# Patient Record
Sex: Male | Born: 1960 | Race: White | Hispanic: No | Marital: Married | Smoking: Current some day smoker
Health system: Southern US, Community
[De-identification: ages and names within clinical notes are randomized; demographics above are authoritative.]

## PROBLEM LIST (undated history)

## (undated) DIAGNOSIS — K5792 Diverticulitis of intestine, part unspecified, without perforation or abscess without bleeding: Secondary | ICD-10-CM

## (undated) DIAGNOSIS — G4730 Sleep apnea, unspecified: Secondary | ICD-10-CM

## (undated) DIAGNOSIS — E119 Type 2 diabetes mellitus without complications: Secondary | ICD-10-CM

## (undated) DIAGNOSIS — G629 Polyneuropathy, unspecified: Secondary | ICD-10-CM

## (undated) DIAGNOSIS — I10 Essential (primary) hypertension: Secondary | ICD-10-CM

## (undated) DIAGNOSIS — G2581 Restless legs syndrome: Secondary | ICD-10-CM

## (undated) DIAGNOSIS — E785 Hyperlipidemia, unspecified: Secondary | ICD-10-CM

## (undated) DIAGNOSIS — C801 Malignant (primary) neoplasm, unspecified: Secondary | ICD-10-CM

## (undated) DIAGNOSIS — M1990 Unspecified osteoarthritis, unspecified site: Secondary | ICD-10-CM

## (undated) HISTORY — DX: Hyperlipidemia, unspecified: E78.5

## (undated) HISTORY — DX: Diverticulitis of intestine, part unspecified, without perforation or abscess without bleeding: K57.92

## (undated) HISTORY — PX: RESECTION OF ABDOMINAL MASS: SHX6450

## (undated) HISTORY — DX: Essential (primary) hypertension: I10

## (undated) HISTORY — DX: Type 2 diabetes mellitus without complications: E11.9

## (undated) HISTORY — DX: Polyneuropathy, unspecified: G62.9

## (undated) HISTORY — PX: COLON SURGERY: SHX602

## (undated) HISTORY — DX: Restless legs syndrome: G25.81

## (undated) HISTORY — DX: Malignant (primary) neoplasm, unspecified: C80.1

---

## 2001-03-09 ENCOUNTER — Ambulatory Visit (HOSPITAL_COMMUNITY): Admission: RE | Admit: 2001-03-09 | Discharge: 2001-03-09 | Payer: Self-pay | Admitting: Internal Medicine

## 2003-07-07 ENCOUNTER — Ambulatory Visit (HOSPITAL_COMMUNITY): Admission: RE | Admit: 2003-07-07 | Discharge: 2003-07-07 | Payer: Self-pay | Admitting: Family Medicine

## 2004-07-29 DIAGNOSIS — K5792 Diverticulitis of intestine, part unspecified, without perforation or abscess without bleeding: Secondary | ICD-10-CM

## 2004-07-29 HISTORY — DX: Diverticulitis of intestine, part unspecified, without perforation or abscess without bleeding: K57.92

## 2004-07-29 HISTORY — PX: COLON SURGERY: SHX602

## 2004-08-31 ENCOUNTER — Inpatient Hospital Stay (HOSPITAL_COMMUNITY): Admission: RE | Admit: 2004-08-31 | Discharge: 2004-09-03 | Payer: Self-pay | Admitting: Family Medicine

## 2004-09-26 ENCOUNTER — Ambulatory Visit: Payer: Self-pay | Admitting: Internal Medicine

## 2004-10-26 ENCOUNTER — Ambulatory Visit (HOSPITAL_COMMUNITY): Admission: RE | Admit: 2004-10-26 | Discharge: 2004-10-26 | Payer: Self-pay | Admitting: Internal Medicine

## 2004-10-26 ENCOUNTER — Ambulatory Visit: Payer: Self-pay | Admitting: Internal Medicine

## 2004-10-26 HISTORY — PX: COLONOSCOPY: SHX174

## 2005-02-25 ENCOUNTER — Ambulatory Visit (HOSPITAL_COMMUNITY): Admission: RE | Admit: 2005-02-25 | Discharge: 2005-02-25 | Payer: Self-pay | Admitting: Family Medicine

## 2005-02-25 ENCOUNTER — Inpatient Hospital Stay (HOSPITAL_COMMUNITY): Admission: AD | Admit: 2005-02-25 | Discharge: 2005-02-27 | Payer: Self-pay | Admitting: Internal Medicine

## 2005-06-11 ENCOUNTER — Encounter (INDEPENDENT_AMBULATORY_CARE_PROVIDER_SITE_OTHER): Payer: Self-pay | Admitting: *Deleted

## 2005-06-11 ENCOUNTER — Inpatient Hospital Stay (HOSPITAL_COMMUNITY): Admission: RE | Admit: 2005-06-11 | Discharge: 2005-06-16 | Payer: Self-pay | Admitting: General Surgery

## 2005-06-21 ENCOUNTER — Emergency Department (HOSPITAL_COMMUNITY): Admission: EM | Admit: 2005-06-21 | Discharge: 2005-06-21 | Payer: Self-pay | Admitting: Emergency Medicine

## 2006-06-18 ENCOUNTER — Emergency Department (HOSPITAL_COMMUNITY): Admission: EM | Admit: 2006-06-18 | Discharge: 2006-06-18 | Payer: Self-pay | Admitting: Emergency Medicine

## 2008-12-29 ENCOUNTER — Emergency Department (HOSPITAL_COMMUNITY): Admission: EM | Admit: 2008-12-29 | Discharge: 2008-12-29 | Payer: Self-pay | Admitting: Emergency Medicine

## 2009-12-08 ENCOUNTER — Ambulatory Visit (HOSPITAL_COMMUNITY): Admission: RE | Admit: 2009-12-08 | Discharge: 2009-12-08 | Payer: Self-pay | Admitting: Family Medicine

## 2010-02-28 DIAGNOSIS — E785 Hyperlipidemia, unspecified: Secondary | ICD-10-CM | POA: Insufficient documentation

## 2010-02-28 DIAGNOSIS — I10 Essential (primary) hypertension: Secondary | ICD-10-CM | POA: Insufficient documentation

## 2010-03-07 ENCOUNTER — Encounter (INDEPENDENT_AMBULATORY_CARE_PROVIDER_SITE_OTHER): Payer: Self-pay

## 2010-03-09 ENCOUNTER — Ambulatory Visit: Payer: Self-pay | Admitting: Internal Medicine

## 2010-03-09 DIAGNOSIS — K921 Melena: Secondary | ICD-10-CM | POA: Insufficient documentation

## 2010-03-09 DIAGNOSIS — Z8719 Personal history of other diseases of the digestive system: Secondary | ICD-10-CM | POA: Insufficient documentation

## 2010-03-13 ENCOUNTER — Encounter: Payer: Self-pay | Admitting: Internal Medicine

## 2010-03-19 ENCOUNTER — Ambulatory Visit: Payer: Self-pay | Admitting: Internal Medicine

## 2010-03-19 ENCOUNTER — Ambulatory Visit (HOSPITAL_COMMUNITY): Admission: RE | Admit: 2010-03-19 | Discharge: 2010-03-19 | Payer: Self-pay | Admitting: Internal Medicine

## 2010-03-19 HISTORY — PX: COLONOSCOPY: SHX174

## 2010-03-25 ENCOUNTER — Encounter: Payer: Self-pay | Admitting: Internal Medicine

## 2010-08-28 NOTE — Miscellaneous (Signed)
Summary: ct- May 2011  Clinical Lists Changes CT Abd/Pelvis W CM - STATUS: Final  IMAGE                                     Perform Date: 13May11 18:09  Ordered By: Phillips Odor MD Arlana Pouch Date: [id number] 15:00  Facility: [facility]                               Department: CT  Service Report Text  [facility] Accession Number: [id number]      Clinical Data: Right lower quadrant pain for 1 week.    CT ABDOMEN AND PELVIS WITH CONTRAST    Technique:  Multidetector CT imaging of the abdomen and pelvis was   performed following the standard protocol during bolus   administration of intravenous contrast.    Contrast:    Comparison: None.    Findings: Lung bases are clear.  No pericardial effusion.    Small 1 cm hypodensity in the right hepatic lobe is unchanged.  The   gallbladder, pancreas, spleen, and left adrenal gland are   unchanged.  The right adrenal gland is surgically absent.  The   kidneys appear normal.    The stomach, small bowel, cecum, appendix are normal.  There are   diverticula of the descending colon and proximal sigmoid colon   without evidence acute inflammation.    Abdominal aorta is normal caliber.  No evidence retroperitoneal   lymphadenopathy.    No free fluid the pelvis.  Bladder prostate are normal.  No   evidence of pelvic lymphadenopathy. Review of  bone windows   demonstrates no aggressive osseous lesions.    IMPRESSION:   1.  No acute abdominal  or pelvic process.   2.  Normal appendix.   3.  Diverticulosis without evidence of diverticulitis.    Read By:  Genevive Bi,  M.D.   Released By:  Genevive Bi,  M.D.  Additional Information  HL7 RESULT STATUS : F  External image : [id number]  External IF Update Timestamp : 2009-12-08:18:28:20.000000

## 2010-08-28 NOTE — Assessment & Plan Note (Signed)
Summary: consult for tcs,rectal bleeding/ss   Primary Care Provider:  [facility]  Chief Complaint:  consult for TCS.  History of Present Illness: 50 year old gentleman with a history of complicated diverticulitis requiring sigmoid colectomy some 5 years ago and reported family history of colon cancer returns now for consideration of screening colonoscopy.  Patient states has some bouts of intermittent bloody diarrhea over the past several months. He may go up to 3 days without a bowel movement and then has a large loose stool. Somewhat constipated at times. Not taking any fiber supplementation.  Diverticulosis on 2006 colonoscopy. 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 He tells me his father may have actually had stomach cancer with metastasis rather he colon cancer but he is not absolutely certain.  since being seen here he has a new diagnosis of sleep apnea treated with nasal CPAP.   Current Medications (verified): 1)  Lotensin 10 Mg Tabs (Benazepril Hcl) .... Two Tablets Daily 2)  Aleve Cold & Sinus 120-220 Mg Xr12h-Tab (Pseudoephedrine-Naproxen Na) .... As Needed 3)  Wellbutrin Sr 150 Mg Xr12h-Tab (Bupropion Hcl) .... Once Daily  Allergies (verified): 1)  ! * Bee Sting  Past History:  Past Medical History: Last updated: 03/07/2010 RIGHT ADRENAL CARCINOMA Diverticulitis Hypertension Hyperlipidemia  Past Surgical History: Last updated: 03/07/2010 RIGHT ADRENALECTOMY ON 02-19-99 Hemicolectomy  Family History: Last updated: 2010-03-31 Father: Deceased age 69's   Colon Cancer Mother: Living age 60   healthy Siblings:one brother living........not healthy  One brother killed in army   Social History: Last updated: 03-31-10 Marital Status: Married Children: 1 Occupation: [employer] doctor  Family History: Father: Deceased age 77's   Colon Cancer Mother: Living age 55   healthy Siblings:one brother living........not healthy  One  brother killed in army   Social History: Marital Status: Married Children: 1 Occupation: [employer] doctor  Vital Signs:  Patient profile:   50 year old male Height:      69 inches Weight:      279 pounds BMI:     41.35 Temp:     98.5 degrees F oral Pulse rate:   88 / minute BP sitting:   110 / 78  (left arm) Cuff size:   large  Vitals Entered By: Cloria Spring LPN (Mar 31, 2010 3:42 PM)  Physical Exam  General:  obese gentleman resting comfortably in no acute distress Lungs:  clear to auscultation Heart:  regular rate rhythm without murmur gallop rub Abdomen:  obese positive bowel sounds soft nontender without appreciable mass or organomegaly Rectal:  deferred until time of colonoscopy  Impression & Recommendations: Impression: A 50 year old gentleman with complicated diverticulitis requiring sigmoid resection previously now has intermittent bloody diarrhea along with  alternating constipation.  It appears he does not have a family history of colon cancer, however.  Recommendations: This gentleman needs to have a diagnostic colonoscopy in the near future. The risks, benefits, limitations, alternatives and imponderables have been reviewed prescription been answered he is agreeable. We'll make further recommendations in the very near future after colonoscopy has been performed.  Appended Document: Orders Update    Clinical Lists Changes  Problems: Added new problem of HEMATOCHEZIA (ICD-578.1) Added new problem of DIVERTICULITIS, HX OF (ICD-V12.79) Orders: Added new Service order of New Patient Level III [phone]) - Signed

## 2010-08-28 NOTE — Letter (Signed)
Summary: Internal Other /TCS order  Internal Other /TCS order   Imported By: Cloria Spring LPN 04/54/0981 19:14:78  _____________________________________________________________________  External Attachment:    Type:   Image     Comment:   External Document

## 2010-08-28 NOTE — Letter (Signed)
Summary: Patient Notice, Colon Biopsy Results  [facility]  [address]   [place], [place] [address]   Phone: [phone]  Fax: [phone]       March 25, 2010   RONY RATZ [address] [place], [place]  [address] [birth year]    Dear Mr. Bremner,  I am pleased to inform you that the biopsies taken during your recent colonoscopy did not show any evidence of cancer upon pathologic examination.  Additional information/recommendations:  You should have a repeat colonoscopy examination  in 7 years.  Please call us if you are having persistent problems or have questions about your condition that have not been fully answered at this time.  Sincerely,    [name] MD, FACP [facility] Ph: [phone]    Fax: [phone]   Appended Document: Patient Notice, Colon Biopsy Results letter mailed to pt  Appended Document: Patient Notice, Colon Biopsy Results reminder in computer

## 2010-08-28 NOTE — Miscellaneous (Signed)
Summary: tcs and surgery note  Clinical Lists Changes  NAME:  Jason French, Jason French               ACCOUNT NO.:  [id number]   MEDICAL RECORD NO.:  [id number]          PATIENT TYPE:  INP   LOCATION:  A306                          FACILITY:  [facility]   PHYSICIAN:  [name], M.D. DATE OF BIRTH:  [birth year]   DATE OF CONSULTATION:  09/26/2004  DATE OF DISCHARGE:  09/03/2004                                   CONSULTATION   CONSULTING PHYSICIAN:  [name], M.D.   CHIEF COMPLAINT:  Diverticulitis, possible colonoscopy.   HISTORY OF PRESENT ILLNESS:  The patient is a 50 year old Caucasian  gentleman with a history of recurrent acute diverticulitis who presents  today for further evaluation and for consideration of colonoscopy.  He  states he has had diverticulitis at least on two occasions each year since  2000.  He recent had a severe attack, required hospitalization.  CT of the  abdomen and pelvis, on August 31, 2004, revealed significant inflammation  surrounding the sigmoid colon, multiple diverticula, and findings felt to be  consistent for diverticulitis.  No evidence of abscess or free  intraperitoneal air.  On the same day, he had a white count of 18,500, sed  rate 27, LFTs were normal.  He was given antibiotics while hospitalized and  a completed the course as an outpatient.  He completed antibiotics last  week.  He states he feels much better.  In between attacks, he denies any  problems with his bowels but proceeding an attack of diverticulitis, he gets  constipated.  He is not on fiber supplementation regularly but does consume  a high fiber cereal on a daily basis.  Currently his bowels are moving  daily.  He occasionally sees blood in his stool.  He is color blind and  unsure of the color of the blood.  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  He  denies any chronic nausea, vomiting.  He  occasionally has acid reflux but is  no longer on PPI therapy.  He tries to watch his diet or takes OTC antacids.  With regards to his recent abdominal pain, he tends to get lower pelvic,  midline pelvic, abdominal pain with the diverticulitis.  This pain is now  resolved, status post treatment.  He tells me this is at least his third  documented case of acute diverticulitis based on CT scans.  His first attack  was in 2000 and this involved the distal descending colon per CT report.  He  also had a surveillance CT regarding his history of adrenal gland carcinoma  and at this time had a mild flare diverticulitis then as well.   CURRENT MEDICATIONS:  1.  Lotensin 10 mg every day.  2.  Tylenol or BC Powder p.r.n.   ALLERGIES:  No known drug allergies.   PAST MEDICAL HISTORY:  1.  Hypertension.  2.  Recurrent acute diverticulitis.  3.  Colonoscopy, in August 2002, by Dr. [name], revealed internal hemorrhoids  and scattered left sided diverticula.  He had a polyp on a stalk at the      hepatic flexure with pathology consistent with inflammatory polyp with      focal hyperplastic architecture.  No adenomatous changes seen.  4.  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  5.  He has had carpal tunnel release.   FAMILY HISTORY:  Mother has a history of colonic polyps.  Father had surgery  in his 41s for colorectal cancer.  He is status post surgery and  chemotherapy and doing well.   SOCIAL HISTORY:  He is married and has a daughter.  He works for __________  .  He smokes a pack of cigarettes daily.  He occasionally consumes alcohol.   REVIEW OF SYSTEMS:  See HPI for GI.  CARDIOPULMONARY:  Denies any chest pain  or shortness of breath.  CONSTITUTIONAL:  Denies any weight loss.   PHYSICAL EXAMINATION:  VITAL SIGNS:  Weight 233, height 5 foot 9 inches,  temp 97.9, blood pressure 136/84, pulse initially 108 on repeat it was 100.  GENERAL:  A  pleasant, well nourished, well developed, Caucasian male in no  acute distress.  SKIN:  Warm and dry.  No jaundice.  HEENT:  Pupils are equal, round and reactive to light.  Conjunctivae are  pink.  Sclerae nonicteric.  Oropharyngeal mucosa moist and pink.  No  lesions, erythema, or exudate.  No lymphadenopathy, thyromegaly.  CHEST:  Lungs are clear to auscultation.  CARDIAC:  Reveals a regular rate and rhythm.  Normal S1 S2.  No murmurs,  rubs or gallops.  ABDOMEN:  Positive bowel sounds.  Soft, nondistended.  He has mild  tenderness to the right mid abdomen which he states is from the chronic pain  from prior surgery.  There is no rebound tenderness or guarding.  No  organomegaly or masses.  No abdominal hernias.  EXTREMITIES:  No edema.   IMPRESSION:  1.  The patient is a 50 year old gentleman with recurrent acute      diverticulitis.  His most recent bout was the first of February, but he      actually just finished antibiotics last week.  He is feeling much      better.  His bowel movements are back to normal.  He denies any      associated abdominal pain.  2.  On examination he has a mild right mid abdominal pain which I feel is      unrelated to his recent diverticulitis.  This is most likely due to his      prior adrenal gland carcinoma surgery.  I have asked the patient to      monitor the pain.  If it seems to worsen or becomes noticeable (as the      patient had not complained of any pain prior to examination), then he      will let us know and may need to consider repeat CT.  Otherwise we will      plan on colonoscopy in the next several weeks.  I discussed with the      patient today that he may ultimately be looking at surgery given      recurrent bouts of diverticulitis.  However, at this time it is      important to proceed with the colonoscopy to ensure there are no other      abnormalities present  in the colon.  In addition, he now has a family      history of  colorectal cancer. 3.  Hematochezia, possibly due to internal hemorrhoids.   PLAN:  1.  Colonoscopy in the next 3-4 weeks.  2.  If he has any worsening abdominal pain or recurrent pain similar to his      diverticulitis, he will let us know.  3.  He should take fiber of choice two tablets daily and continue a high      fiber diet.      LL/MEDQ  D:  09/26/2004  T:  09/26/2004  Job:  [id number]   cc:   Madelin Rear. Sherwood Gambler, MD  [address]  [place]  [place] [address]  Fax: [phone]       NAME:  ZAYDE, STROUPE NO.:  [id number]   MEDICAL RECORD NO.:  [id number]          PATIENT TYPE:  AMB   LOCATION:  DAY                           FACILITY:  [facility]   PHYSICIAN:  [name], M.D. DATE OF BIRTH:  1960/08/14   DATE OF PROCEDURE:  10/26/2004  DATE OF DISCHARGE:                                 OPERATIVE REPORT   PROCEDURE:  Colonoscopy, snare polypectomy, biopsy.   INDICATION FOR PROCEDURE:  The patient is a 50 year old gentleman with a  history of diverticulitis who has had some rectal bleeding and is having  some abdominal pain too.  He was seen in the office on September 26, 2004.  He  said since that time, his abdominal pain has resolved.  Positive family  history for colorectal neoplasia in his father.  Colonoscopy is now being  done.  This approach has been discussed at length, the potential risks,  benefits, and alternatives have been reviewed, questions answered.  He is  agreeable.  Please see documentation in the medical record.   O2 saturation, blood pressure, pulse, and respiration were monitored  throughout the entire procedure.   CONSCIOUS SEDATION:  Versed 5 mg IV, Demerol 125 mg IV in divided doses.   INSTRUMENT USED:  Olympus video chip system.   FINDINGS:  Digital rectal exam revealed no abnormalities.  Endoscopic  findings:  The prep was good.   Rectum:  Examination of the rectal mucosa including retroflexed view of the  anal verge revealed  only internal hemorrhoids.   Colon:  The colonic mucosa was surveyed from the rectosigmoid junction  through the rectosigmoid junction through the left, transverse and right  colon to the area of the appendiceal orifice, ileocecal valve and cecum.  These structures were well-seen and photographed for the record.  From this  level the scope was slowly withdrawn and all previously-mentioned mucosal  surfaces were again seen.  The patient had few scattered pancolonic  diverticula.  There was a 6 mm pedunculated polyp at the splenic flexure,  which was cold snared, and two other diminutive polyps in the mid-descending  colon, which were cold biopsied and removed.  The remainder of the colonic  mucosa appeared normal.  The patient tolerated the procedure well and was  reacted in endoscopy.   IMPRESSION:  1.  Internal hemorrhoids, otherwise normal.  2.  Few scattered pancolonic diverticula, left colon polyps removed as      described above.   RECOMMENDATIONS:  1.  No aspirin or arthritis medications for 10 days.  2.  Hemorrhoid/diverticulosis literature provided to Mr. Regnier.  3.  He should bolster his fiber intake take some Metamucil or Citrucel every      day.  4.  A 10-day course Anusol-HC suppositories one per rectum at bedtime.  5.  Follow up on pathology.  6.  Further recommendations to follow.      RMR/MEDQ  D:  10/26/2004  T:  10/26/2004  Job:  [id number]   cc:   Madelin Rear. Sherwood Gambler, MD  [address]  [place]  [place] [address]  Fax: [phone]    NAME:  ALEXYS, GASSETT NO.:  [id number]   MEDICAL RECORD NO.:  [id number]          PATIENT TYPE:  INP   LOCATION:  0008                         FACILITY:  [facility]   PHYSICIAN:  Angelia Mould. Derrell Lolling, M.D.DATE OF BIRTH:  Mar 26, 1961   DATE OF PROCEDURE:  06/11/2005  DATE OF DISCHARGE:                                 OPERATIVE REPORT   PREOPERATIVE DIAGNOSES:  Sigmoid diverticulitis.   POSTOPERATIVE DIAGNOSES:  Sigmoid  diverticulitis.   OPERATION:  Sigmoid colectomy.   SURGEON:  Angelia Mould. Derrell Lolling, M.D.   FIRST ASSISTANT:  Gita Kudo, M.D.   INDICATIONS FOR PROCEDURE:  This is a 50 year old white man who has been  hospitalized several times for diverticulitis. 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 He has been hospitalized in February of  this year and again in July of this and on both occasions clinically had  diverticulitis and by CT scan had a focal area of inflammatory change in the  sigmoid colon. The last time he was hospitalized and a CT scan suggested a  small abscess. He has had a colonoscopy this year which shows diverticula  but no tumor. He was counseled as an outpatient regarding elective surgery  and was advised that was a good option given his young age and recurrent  disease. He is brought to the operating room electively having undergone a 2  day bowel prep at home.   FINDINGS:  There was a focal area of chronic inflammation in the mid sigmoid  colon. This area extended over a distance of about 6 cm and the proximal  rectum distally and the proximal sigmoid and distal descending colon looked  perfectly normal. Really no significant diverticula were seen. There were  just a few adhesions in the mid abdomen. There was no fluid. There was no  sign of any recurrent cancer. The small bowel felt normal.   TECHNIQUE:  Following the induction of general endotracheal anesthesia, the  patient was identified, intravenous antibiotics were given, a Foley catheter  was inserted, the nasogastric tube was inserted and the abdomen was prepped  and draped in a sterile  fashion. A lower midline incision was made. The  fascia was incised in the midline. The abdominal cavity was entered. Self  retaining retractors were  placed. The abdomen was explored with findings as  described above. The small bowel was packed away with moist laparotomy pads.  I mobilized the descending colon by dividing the lateral peritoneal  attachments and mobilized it toward the midline. I did not have to take down  the splenic flexure. I mobilized the sigmoid colon by dividing its lateral  peritoneal attachments and mobilizing it medially. I also incised the  peritoneum down into the pelvis on each side of the proximal rectum. I  identified the area of disease. I selected an area proximal and distal to  this at least 8 cm proximal and distal to the focal disease and the colon  was transected between Allen clamps. Mesenteric vessels were isolated,  clamped, divided and ligated with 2-0 silk ties and 2-0 silk suture  ligatures. The mesenteric dissection was kept close to the colon to avoid  injury to any retroperitoneal structure. An anastomosis was created between  the proximal and distal ends of the colon in an end to end fashion with  interrupted sutures of 2-0 silk. Corner stitches were placed to set up the  anastomosis. A single suture was placed in the midpoint of the back wall of  the anastomosis and tied. The back wall of the anastomosis was then  completed with interrupted simple sutures of 2-0 silk. At the corners, we  placed several interrupted inverting sutures of 2-0 silk and after this  turned the corners in nicely, we completed the anastomosis anteriorly with  simple sutures of 2-0 silk which were placed in inverting fashion. After all  of this was done, the anastomosis was inspected. It looked very good. There  was no tension. The vascularity of the colon was excellent. At this point,  we changed our instruments and gloves and suction devices. We irrigated out  the lower abdomen and pelvis with 2000 mL of saline. There was no bleeding.  Fluid was completely clear. We closed the mesentery with some interrupted   figure-of-eight sutures of 2-0 silk. We placed Tisseel around the  anastomosis and let it harden and then we returned the omentum and small  bowel to their anatomic positions. The midline fascia was closed with a  running suture of #1 PDS. After irrigating the skin, we closed the skin with  skin staples. Clean bandages were placed and the patient taken to the  recovery room in stable condition. Estimated blood loss was about 75 to 100  mL. Complications none. Sponge and instrument counts were correct.      Angelia Mould. Derrell Lolling, M.D.  Electronically Signed     HMI/MEDQ  D:  06/11/2005  T:  06/11/2005  Job:  [id number]   cc:   Patrica Duel, M.D.  Fax: [phone]   [name], M.D.  [address]  [place]  [place] [address]    [facility] Surgical Pathology - STATUS: Final             By: Osie Bond  ,      Perform Date: [phone] 00:01  Ordered By: Lauretta Chester,        Ordered Date: [phone] 11:24  Facility: [facility]                              Department:  CPATH  Service Report Text  [facility]   [address] [place], [place] [address]   [phone]    REPORT OF SURGICAL PATHOLOGY    Case #: [id number]   Patient Name: NICHOLI, GHUMAN   PID: [id number]   Pathologist: Renato Battles, M.D.   DOB[birth year]Age 50[birth year] (Age: 15) Gender: M   Date Taken: 06/11/2005   Date Received: 06/11/2005    FINAL DIAGNOSIS    ***MICROSCOPIC EXAMINATION AND DIAGNOSIS***    SIGMOID COLON, PARTIAL RESECTION: DIVERTICULOSIS AND   DIVERTICULITIS. TWO REACTIVE LYMPH NODES.    cf   Date Reported: 06/12/2005 Renato Battles, M.D.   *** Electronically Signed Out By MS ***    Clinical information   Diverticulitis. (hd)    specimen(s) obtained   Colon, segmental resection, sigmoid    Gross Description   Specimen: Sigmoid   Length: 10 cm   Serosa: Pink to hyperemic, focally granular, with a small   amount of attached soft to rubbery mesentery   Contents: A small  amount of soft green fecal material   Mucosa/Wall: Tan to hyperemic, smooth with normal intestinal   folds. There are several grossly un-perforated diverticula./ up   to 1 cm thick   Lymph nodes: Found within the fat are two possible lymph nodes,   0.3 and 0.4 cm in greatest dimension   Block Summary:   A, B- Margins of resection   C, D- Diverticula   E- Two possible nodes, whole (5 blocks)   SW:mw 06/11/05    mw/

## 2010-09-07 ENCOUNTER — Encounter: Payer: Self-pay | Admitting: Internal Medicine

## 2010-09-13 NOTE — Letter (Signed)
Summary: [facility]  [facility]   Imported By: Rexene Alberts 09/07/2010 09:29:24  _____________________________________________________________________  External Attachment:    Type:   Image     Comment:   External Document

## 2010-12-14 NOTE — Consult Note (Signed)
Jason French, Jason French               ACCOUNT NO.:  [id number]   MEDICAL RECORD NO.:  [id number]          PATIENT TYPE:  INP   LOCATION:  A306                          FACILITY:  [facility]   PHYSICIAN:  [name], M.D. DATE OF BIRTH:  [birth year]   DATE OF CONSULTATION:  09/26/2004  DATE OF DISCHARGE:  09/03/2004                                   CONSULTATION   CONSULTING PHYSICIAN:  [name], M.D.   CHIEF COMPLAINT:  Diverticulitis, possible colonoscopy.   HISTORY OF PRESENT ILLNESS:  The patient is a 50 year old Caucasian  gentleman with a history of recurrent acute diverticulitis who presents  today for further evaluation and for consideration of colonoscopy.  He  states he has had diverticulitis at least on two occasions each year since  2000.  He recent had a severe attack, required hospitalization.  CT of the  abdomen and pelvis, on August 31, 2004, revealed significant inflammation  surrounding the sigmoid colon, multiple diverticula, and findings felt to be  consistent for diverticulitis.  No evidence of abscess or free  intraperitoneal air.  On the same day, he had a white count of 18,500, sed  rate 27, LFTs were normal.  He was given antibiotics while hospitalized and  a completed the course as an outpatient.  He completed antibiotics last  week.  He states he feels much better.  In between attacks, he denies any  problems with his bowels but proceeding an attack of diverticulitis, he gets  constipated.  He is not on fiber supplementation regularly but does consume  a high fiber cereal on a daily basis.  Currently his bowels are moving  daily.  He occasionally sees blood in his stool.  He is color blind and  unsure of the color of the blood.  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  He  denies any chronic nausea, vomiting.  He occasionally has acid reflux but is  no longer on PPI therapy.   He tries to watch his diet or takes OTC antacids.  With regards to his recent abdominal pain, he tends to get lower pelvic,  midline pelvic, abdominal pain with the diverticulitis.  This pain is now  resolved, status post treatment.  He tells me this is at least his third  documented case of acute diverticulitis based on CT scans.  His first attack  was in 2000 and this involved the distal descending colon per CT report.  He  also had a surveillance CT regarding his history of adrenal gland carcinoma  and at this time had a mild flare diverticulitis then as well.   CURRENT MEDICATIONS:  1.  Lotensin 10 mg every day.  2.  Tylenol or BC Powder p.r.n.   ALLERGIES:  No known drug allergies.   PAST MEDICAL HISTORY:  1.  Hypertension.  2.  Recurrent acute diverticulitis.  3.  Colonoscopy, in August 2002, by Dr. [name], revealed internal hemorrhoids      and scattered left sided diverticula.  He had a polyp  on a stalk at the      hepatic flexure with pathology consistent with inflammatory polyp with      focal hyperplastic architecture.  No adenomatous changes seen.  4.  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  5.  He has had carpal tunnel release.   FAMILY HISTORY:  Mother has a history of colonic polyps.  Father had surgery  in his 92s for colorectal cancer.  He is status post surgery and  chemotherapy and doing well.   SOCIAL HISTORY:  He is married and has a daughter.  He works for __________  .  He smokes a pack of cigarettes daily.  He occasionally consumes alcohol.   REVIEW OF SYSTEMS:  See HPI for GI.  CARDIOPULMONARY:  Denies any chest pain  or shortness of breath.  CONSTITUTIONAL:  Denies any weight loss.   PHYSICAL EXAMINATION:  VITAL SIGNS:  Weight 233, height 5 foot 9 inches,  temp 97.9, blood pressure 136/84, pulse initially 108 on repeat it was 100.  GENERAL:  A pleasant, well nourished, well developed, Caucasian male in no   acute distress.  SKIN:  Warm and dry.  No jaundice.  HEENT:  Pupils are equal, round and reactive to light.  Conjunctivae are  pink.  Sclerae nonicteric.  Oropharyngeal mucosa moist and pink.  No  lesions, erythema, or exudate.  No lymphadenopathy, thyromegaly.  CHEST:  Lungs are clear to auscultation.  CARDIAC:  Reveals a regular rate and rhythm.  Normal S1 S2.  No murmurs,  rubs or gallops.  ABDOMEN:  Positive bowel sounds.  Soft, nondistended.  He has mild  tenderness to the right mid abdomen which he states is from the chronic pain  from prior surgery.  There is no rebound tenderness or guarding.  No  organomegaly or masses.  No abdominal hernias.  EXTREMITIES:  No edema.   IMPRESSION:  1.  The patient is a 50 year old gentleman with recurrent acute      diverticulitis.  His most recent bout was the first of February, but he      actually just finished antibiotics last week.  He is feeling much      better.  His bowel movements are back to normal.  He denies any      associated abdominal pain.  2.  On examination he has a mild right mid abdominal pain which I feel is      unrelated to his recent diverticulitis.  This is most likely due to his      prior adrenal gland carcinoma surgery.  I have asked the patient to      monitor the pain.  If it seems to worsen or becomes noticeable (as the      patient had not complained of any pain prior to examination), then he      will let us know and may need to consider repeat CT.  Otherwise we will      plan on colonoscopy in the next several weeks.  I discussed with the      patient today that he may ultimately be looking at surgery given      recurrent bouts of diverticulitis.  However, at this time it is      important to proceed with the colonoscopy to ensure there are no other      abnormalities present in the colon.  In addition, he now has a  family      history of colorectal cancer. 3.  Hematochezia, possibly due to internal  hemorrhoids.   PLAN:  1.  Colonoscopy in the next 3-4 weeks.  2.  If he has any worsening abdominal pain or recurrent pain similar to his      diverticulitis, he will let us know.  3.  He should take fiber of choice two tablets daily and continue a high      fiber diet.      LL/MEDQ  D:  09/26/2004  T:  09/26/2004  Job:  [id number]   cc:   Madelin Rear. Sherwood Gambler, MD  [address]  [place]  [place] [address]  Fax: [phone]

## 2010-12-14 NOTE — Op Note (Signed)
NAME:  Jason French, Jason French               ACCOUNT NO.:  [id number]   MEDICAL RECORD NO.:  [id number]          PATIENT TYPE:  AMB   LOCATION:  DAY                           FACILITY:  [facility]   PHYSICIAN:  [name], M.D. DATE OF BIRTH:  02-04-1961   DATE OF PROCEDURE:  10/26/2004  DATE OF DISCHARGE:                                 OPERATIVE REPORT   PROCEDURE:  Colonoscopy, snare polypectomy, biopsy.   INDICATION FOR PROCEDURE:  The patient is a 50 year old gentleman with a  history of diverticulitis who has had some rectal bleeding and is having  some abdominal pain too.  He was seen in the office on September 26, 2004.  He  said since that time, his abdominal pain has resolved.  Positive family  history for colorectal neoplasia in his father.  Colonoscopy is now being  done.  This approach has been discussed at length, the potential risks,  benefits, and alternatives have been reviewed, questions answered.  He is  agreeable.  Please see documentation in the medical record.   O2 saturation, blood pressure, pulse, and respiration were monitored  throughout the entire procedure.   CONSCIOUS SEDATION:  Versed 5 mg IV, Demerol 125 mg IV in divided doses.   INSTRUMENT USED:  Olympus video chip system.   FINDINGS:  Digital rectal exam revealed no abnormalities.  Endoscopic  findings:  The prep was good.   Rectum:  Examination of the rectal mucosa including retroflexed view of the  anal verge revealed only internal hemorrhoids.   Colon:  The colonic mucosa was surveyed from the rectosigmoid junction  through the rectosigmoid junction through the left, transverse and right  colon to the area of the appendiceal orifice, ileocecal valve and cecum.  These structures were well-seen and photographed for the record.  From this  level the scope was slowly withdrawn and all previously-mentioned mucosal  surfaces were again seen.  The patient had few scattered pancolonic  diverticula.  There was a 6  mm pedunculated polyp at the splenic flexure,  which was cold snared, and two other diminutive polyps in the mid-descending  colon, which were cold biopsied and removed.  The remainder of the colonic  mucosa appeared normal.  The patient tolerated the procedure well and was  reacted in endoscopy.   IMPRESSION:  1.  Internal hemorrhoids, otherwise normal.  2.  Few scattered pancolonic diverticula, left colon polyps removed as      described above.   RECOMMENDATIONS:  1.  No aspirin or arthritis medications for 10 days.  2.  Hemorrhoid/diverticulosis literature provided to [name].  3.  He should bolster his fiber intake take some Metamucil or Citrucel every      day.  4.  A 10-day course Anusol-HC suppositories one per rectum at bedtime.  5.  Follow up on pathology.  6.  Further recommendations to follow.      RMR/MEDQ  D:  10/26/2004  T:  10/26/2004  Job:  [id number]   cc:   Madelin Rear. Sherwood Gambler, MD  [address]  [place]  [place] [address]  Fax: [phone]

## 2010-12-14 NOTE — H&P (Signed)
Jason French, Jason French               ACCOUNT NO.:  [id number]   MEDICAL RECORD NO.:  [id number]          PATIENT TYPE:  INP   LOCATION:  A306                          FACILITY:  [facility]   PHYSICIAN:  Patrica Duel, M.D.    DATE OF BIRTH:  [birth year]   DATE OF ADMISSION:  08/31/2004  DATE OF DISCHARGE:  LH                                HISTORY & PHYSICAL   CHIEF COMPLAINT:  Abdominal pain.   HISTORY OF PRESENT ILLNESS:  This is a 50 year old male with a history of  adrenal tumor status post complete resection.  He also has a history of  diverticulitis, has been seen by Dr. Kendell Bane in the past.   The patient presented to the office with a 48-hour history of increasingly  severe lower abdominal pain and cramping.  Clinically, he appeared quite ill  with a very tender abdomen, particularly in the left lower quadrant.  He was  immediately sent for CT scanning.   CT scan confirmed the presence of relatively extensive diverticulitis of the  sigmoid colon without perforation or abscess.  The patient is admitted with  acute diverticulitis.   There is no history of nausea, vomiting, chest pain, shortness of breath,  headache, neurologic deficits or genitourinary symptoms.   CURRENT MEDICATIONS:  None.   ALLERGIES:  PENICILLIN, SULFA.   PAST MEDICAL HISTORY:  As noted.   SOCIAL HISTORY:  Nonsmoker.  Nondrinker.  He is a Naval architect.   FAMILY HISTORY:  Noncontributory.   REVIEW OF SYSTEMS:  Negative except as mentioned.   PHYSICAL EXAMINATION:  Very pleasant male who is alert and oriented, no  acute distress.   VITAL SIGNS:  Temperature 100.9, pulse 120, blood pressure 140/99.  HEENT:  Normocephalic, atraumatic.  Pupils are equal.  Ears, nose, throat  benign.  There is no scleral icterus.  NECK:  Supple, no bruits or masses.  LUNGS:  Clear.  HEART:  Heart sounds normal.  ABDOMEN:  Mildly protuberant.  There is marked tenderness and guarding in  the left lower quadrant.  Bowel sounds  are intact.  EXTREMITIES:  No clubbing, cyanosis or edema.   LABORATORY DATA:  Currently pending.   ASSESSMENT:  Acute diverticulitis in a 51 year old male with a history of  the same.   PLAN:  IV Cipro, Flagyl, pain control, possible GI consult.  Surgery is not  indicated at this time.  Will follow.      MC/MEDQ  D:  09/01/2004  T:  09/01/2004  Job:  [id number]

## 2010-12-14 NOTE — Discharge Summary (Signed)
NAMEABDELAZIZ, WESTENBERGER               ACCOUNT NO.:  [id number]   MEDICAL RECORD NO.:  [id number]          PATIENT TYPE:  INP   LOCATION:  A306                          FACILITY:  [facility]   PHYSICIAN:  Patrica Duel, M.D.    DATE OF BIRTH:  [birth year]   DATE OF ADMISSION:  08/31/2004  DATE OF DISCHARGE:  02/06/2006LH                                 DISCHARGE SUMMARY   DISCHARGE DIAGNOSES:  1.  Acute diverticulitis, CT documented, excellent response to therapy.  2.  History of adrenal tumor, status post resection.   __________  Please refer to the admitting note.  Briefly, this 50 year old male, with a  history as noted, presented to the office with a 24-48-hour history of  increasingly severe lower abdominal pain.  Clinically, he appeared quite ill  with a very tender abdomen, particularly in the left lower quadrant.  He was  sent immediately for CT scanning which revealed the presence of extensive  diverticulitis of the sigmoid colon without perforation or abscess.  He was  admitted with acute diverticulitis.   HOSPITAL COURSE:  The patient did well with Cipro and Flagyl Rx.  His pain  has been reduced dramatically.  His appetite is good.  He has had no fever  or chills.  Laboratory has remained stable (potassium 3.2, now normal status  post supplementation) and white count 12,000.   Clinically, the patient is doing very well, tolerating a diet, and is stable  for discharge.  He understands should he have more pain or recurrent fever  or other problems, he is to call us immediately.   DISPOSITION:  Cipro 500 mg b.i.d. and Flagyl 500 mg t.i.d. x 14 days total.  He will be followed and treated expectantly as an outpatient.  We will  consider colonoscopy, though he has had one in the past 2-3 years which was  essentially normal.  We will make further decisions as an outpatient.      MC/MEDQ  D:  09/03/2004  T:  09/03/2004  Job:  [id number]

## 2010-12-14 NOTE — Consult Note (Signed)
Jason French, Jason French               ACCOUNT NO.:  [id number]   MEDICAL RECORD NO.:  [id number]          PATIENT TYPE:  EMS   LOCATION:  ED                           FACILITY:  [facility]   PHYSICIAN:  Angelia Mould. Derrell Lolling, M.D.DATE OF BIRTH:  12/16/1960   DATE OF CONSULTATION:  06/21/2005  DATE OF DISCHARGE:                                   CONSULTATION   CHIEF COMPLAINT:  Painful urination, chills,  and abdominal wound drainage.   HISTORY OF PRESENT ILLNESS:  This is a 50 year old white man well-known to  me.  He underwent an elective sigmoid colon resection on November14,2006 for  diverticulitis.  He did well following that surgery and went home.  He  called me today stating that he had drainage from his wound, was having some  painful urination, and had chills last night. He denied any fever.  His  appetite has been excellent.  He is been having two or three soft bowel  movements/day. He denies any nausea or vomiting.  He and his wife are  concerned about the wound drainage and the dysuria.   Upon further questioning he states that one of my partners took out a couple  of staples from his wound the day he was discharged but did not find any  infection.  He also states that he has normal-appearing urine and he has  copious urine output with a good stream. He just has pain in his penis and a  little bit in the suprapubic area during the active urination and it hurts  right at the end of urination as well.  Again he states that the urine looks  normal.  He states that he is having to wear a bandage on the lower part of  this wound because it drains a little bit.  He has been taking stool  softeners.   EXAMINATION:  GENERAL:  A pleasant middle-aged man who does not appear  acutely ill.  Strong odor of tobacco.  VITAL SIGNS:  Temperature 98.5. Heart rate 121.  Repeat examine it was 85.  Respirations 16, blood pressure 147/102.  ABDOMEN:  Slightly obese.  Soft.  Active bowel sounds.  Lower  midline scar  with staples in place.  There is a very faint amount of erythema right at  the lower end of the midline incision and an open area, where three or four  staples have been removed but there is no purulence or foul odor.  There is  no significant cellulitis.  I spread this area with some Q-tips down about 2  cm in depth but did not get any purulence out.  He does not have any  induration in the area.  The inguinal areas feel normal and are nontender.  GENITALIA:  The penis looks normal.  LUNGS:  Were clear to auscultation.   LABORATORY DATA:  Urinalysis is clear.  Leukocyte esterase is negative.  Nitrite is negative, specific gravity 1.823, pH of 0.5.  Blood count reveals  a hemoglobin of 16.6 and a white blood cell count of 13.1.  Electrolytes  show a BUN of 10,  creatinine of 1.0, sodium 136, potassium 446.   ASSESSMENT:  1.  Dysuria.  It is not clear whether this is simply urethral irritation      from the Foley catheter he had in the hospital, occult urinary tract      infection, or pelvic problem.  2.  Wound drainage.  No evidence of obvious wound infection.  3.  Status post sigmoid colectomy.  He does not appear to have any      significant intra-abdominal complication that I can detect at this time.   PLAN:  1.  Augmentin 875 mg p.o. q.12h. x5 days.  2.  Pyridium 100 mg p.o. t.i.d. x2 days.  3.  He will return see me in the office on Tuesday, November28,2006.  4.  If he becomes more ill or develops more significant symptoms, we will do      a CT scan of the abdomen and pelvis.  That does not seem necessary at      this time.      Angelia Mould. Derrell Lolling, M.D.  Electronically Signed     HMI/MEDQ  D:  06/21/2005  T:  06/21/2005  Job:  [id number]

## 2010-12-14 NOTE — Discharge Summary (Signed)
Jason French, Jason French               ACCOUNT NO.:  [id number]   MEDICAL RECORD NO.:  [id number]          PATIENT TYPE:  INP   LOCATION:  1418                         FACILITY:  [facility]   PHYSICIAN:  Angelia Mould. Derrell Lolling, M.D.DATE OF BIRTH:  07-03-1961   DATE OF ADMISSION:  06/11/2005  DATE OF DISCHARGE:  06/16/2005                                 DISCHARGE SUMMARY   FINAL DIAGNOSES:  1.  Chronic diverticulitis, sigmoid colon.  2.  Hypertension.  3.  Status post right adrenalectomy for adrenocortical carcinoma, no known      recurrence to date.  4.  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   OPERATIONS PERFORMED:  Sigmoid colectomy.  Date June 11, 2005.   HISTORY:  This is a 50 year old white man who has a history of  diverticulitis going back 6 years, at which time he was hospitalized for  diverticulitis.  He was found to have a right-sided adrenal cortical  carcinoma and ultimately had a right adrenalectomy through a right  thoracoabdominal incision by Dr. Hillary Bow at [facility].  He has no  known recurrence to date and has been followed by oncology at [facility].   He was once again hospitalized in February 2006 in [place] and then again  hospitalized in July of this year, both for left lower quadrant pain and CT  findings of inflammatory process of the  sigmoid colon.  He had a  colonoscopy in March of this year showing pancolonic diverticula but no  tumor..  His disease process radiographically has always been localized to  the sigmoid colon.  I was asked to see him as an outpatient.  He was offered  elective sigmoid colectomy which he decided to do and brought to the  hospital electively following bowel prep.   PHYSICAL EXAMINATION:  GENERAL:  Pleasant young man, somewhat overweight, in  no distress.  VITAL SIGNS:  Height 5 feet 9 inches, weight 238.  NECK:  No mass, no adenopathy, no jugular venous distension.  LUNGS:  Clear to  auscultation.  HEART:  Regular rate and rhythm, no murmur.  ABDOMEN:  Somewhat obese, soft, slight suprapubic and left lower quadrant  tenderness but no mass, no distension.  Well-healed right thoracoabdominal  incision.   HOSPITAL COURSE:  On the day of admission, the patient was taken to the  operating room and underwent a sigmoid colectomy.  He seemed to have fairly  focal disease, although there were a few diverticula elsewhere in the colon.  Primary hand-sewn anastomosis was performed.   Postoperatively, the patient did well.  Foley catheter was removed on postop  day #2.  He was passing gas and tolerating a liquid diet by postop day #3.  He was ready for discharge by postop day #5 on June 16, 2005.  At  that time, he had had some bowel movements, was passing flatus, feeling  well, tolerating a regular diet.  His abdominal exam was unremarkable.  His  wounds looked good.  He was given a prescription for pain medication and  asked to follow up with me in  the office in 1-2 weeks.      Angelia Mould. Derrell Lolling, M.D.  Electronically Signed     HMI/MEDQ  D:  07/15/2005  T:  07/16/2005  Job:  [id number]   cc:   Patrica Duel, M.D.  Fax: [phone]   [name], M.D.  [address]  [place]  [place] [address]

## 2010-12-14 NOTE — Op Note (Signed)
NAMEJORDANI, NUNN               ACCOUNT NO.:  [id number]   MEDICAL RECORD NO.:  [id number]          PATIENT TYPE:  INP   LOCATION:  0008                         FACILITY:  [facility]   PHYSICIAN:  Angelia Mould. Derrell Lolling, M.D.DATE OF BIRTH:  10/06/1960   DATE OF PROCEDURE:  06/11/2005  DATE OF DISCHARGE:                                 OPERATIVE REPORT   PREOPERATIVE DIAGNOSES:  Sigmoid diverticulitis.   POSTOPERATIVE DIAGNOSES:  Sigmoid diverticulitis.   OPERATION:  Sigmoid colectomy.   SURGEON:  Angelia Mould. Derrell Lolling, M.D.   FIRST ASSISTANT:  Gita Kudo, M.D.   INDICATIONS FOR PROCEDURE:  This is a 50 year old white man who has been  hospitalized several times for diverticulitis. 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 He has been hospitalized in February of  this year and again in July of this and on both occasions clinically had  diverticulitis and by CT scan had a focal area of inflammatory change in the  sigmoid colon. The last time he was hospitalized and a CT scan suggested a  small abscess. He has had a colonoscopy this year which shows diverticula  but no tumor. He was counseled as an outpatient regarding elective surgery  and was advised that was a good option given his young age and recurrent  disease. He is brought to the operating room electively having undergone a 2  day bowel prep at home.   FINDINGS:  There was a focal area of chronic inflammation in the mid sigmoid  colon. This area extended over a distance of about 6 cm and the proximal  rectum distally and the proximal sigmoid and distal descending colon looked  perfectly normal. Really no significant diverticula were seen. There were  just a few adhesions in the mid abdomen. There was no fluid. There was no  sign of any recurrent cancer.  The small bowel felt normal.   TECHNIQUE:  Following the induction of general endotracheal anesthesia, the  patient was identified, intravenous antibiotics were given, a Foley catheter  was inserted, the nasogastric tube was inserted and the abdomen was prepped  and draped in a sterile fashion. A lower midline incision was made. The  fascia was incised in the midline. The abdominal cavity was entered. Self  retaining retractors were placed. The abdomen was explored with findings as  described above. The small bowel was packed away with moist laparotomy pads.  I mobilized the descending colon by dividing the lateral peritoneal  attachments and mobilized it toward the midline. I did not have to take down  the splenic flexure. I mobilized the sigmoid colon by dividing its lateral  peritoneal attachments and mobilizing it medially. I also incised the  peritoneum down into the pelvis on each side of the proximal rectum. I  identified the area of disease. I selected an area proximal and distal to  this at least 8 cm proximal and distal to the focal disease and  the colon  was transected between [facility] clamps. Mesenteric vessels were isolated,  clamped, divided and ligated with 2-0 silk ties and 2-0 silk suture  ligatures. The mesenteric dissection was kept close to the colon to avoid  injury to any retroperitoneal structure. An anastomosis was created between  the proximal and distal ends of the colon in an end to end fashion with  interrupted sutures of 2-0 silk. Corner stitches were placed to set up the  anastomosis. A single suture was placed in the midpoint of the back wall of  the anastomosis and tied. The back wall of the anastomosis was then  completed with interrupted simple sutures of 2-0 silk. At the corners, we  placed several interrupted inverting sutures of 2-0 silk and after this  turned the corners in nicely, we completed the anastomosis anteriorly with  simple sutures of 2-0 silk  which were placed in inverting fashion. After all  of this was done, the anastomosis was inspected. It looked very good. There  was no tension. The vascularity of the colon was excellent. At this point,  we changed our instruments and gloves and suction devices. We irrigated out  the lower abdomen and pelvis with 2000 mL of saline. There was no bleeding.  Fluid was completely clear. We closed the mesentery with some interrupted  figure-of-eight sutures of 2-0 silk. We placed Tisseel around the  anastomosis and let it harden and then we returned the omentum and small  bowel to their anatomic positions. The midline fascia was closed with a  running suture of #1 PDS. After irrigating the skin, we closed the skin with  skin staples. Clean bandages were placed and the patient taken to the  recovery room in stable condition. Estimated blood loss was about 75 to 100  mL. Complications none. Sponge and instrument counts were correct.      Angelia Mould. Derrell Lolling, M.D.  Electronically Signed     HMI/MEDQ  D:  06/11/2005  T:  06/11/2005  Job:  [id number]   cc:   Patrica Duel, M.D.  Fax: [phone]   [name], M.D.  [address]  [place]  [place] [address]

## 2010-12-14 NOTE — H&P (Signed)
Jason French, Jason French               ACCOUNT NO.:  [id number]   MEDICAL RECORD NO.:  [id number]          PATIENT TYPE:  INP   LOCATION:  A304                          FACILITY:  [facility]   PHYSICIAN:  Madelin Rear. Sherwood Gambler, MD  DATE OF BIRTH:  1961-07-29   DATE OF ADMISSION:  02/25/2005  DATE OF DISCHARGE:  LH                                HISTORY & PHYSICAL   CHIEF COMPLAINT:  Left lower quadrant abdominal pain for several days  associated with constipation for two to three weeks.  In the past 24 hours,  he has had nausea and vomiting and did take a laxative for the constipation  with fair results this morning.   PAST MEDICAL HISTORY:  1.  Jason French is a 50 year old white male who has had several bouts of      diverticulitis with hospitalization in February of this year.  He had      diverticulitis last in 2000.  During this last hospitalization in      February 2006, he was evaluated by Dr. [name] and subsequently had a      colonoscopy and polypectomy post hospitalization.  The colonoscopy was      done October 26, 2004, and the polypectomy diagnosis was hyperplastic      polyp.  2.  Hypertension.  3.  Right adrenal gland carcinoma five years ago.  4.  Carpal tunnel syndrome.   REVIEW OF SYSTEMS:  He denies any headache, dizziness.  No blurred vision,  no sore throat or earache, no cough, chest pain, palpitations, or shortness  of breath, abdominal pain, constipation. Nausea and vomiting as noted above.  He denies any urinary tract symptoms.  In general, he is fatigued and has  anorexia and just does not feel well.   FAMILY HISTORY:  His mother has a history of colon polyps.  His father had  surgery in his 27s for colorectal cancer.  Apparently the father is post  surgery and chemotherapy and is doing well.   SOCIAL HISTORY:  Jason French is married and has a daughter.  He smokes a pack  of cigarettes daily.  He is a [employer] and occasionally consumes  alcohol.   CURRENT  MEDICATIONS:  Lotensin 10 mg p.o. daily.   ALLERGIES:  He reports sensitivity to TRAMADOL which causes upset stomach  and chest pain but no true medication allergies.   PHYSICAL EXAMINATION:  GENERAL:  Alert and oriented, in no acute distress,  but does state he feels bad.  VITAL SIGNS: Temperature 98, blood pressure 110/80.  Weight 230.  HEENT:  Unremarkable.  NECK:  Supple without adenopathy.  HEART:  Regular rate and rhythm without murmur.  LUNGS:  Clear to auscultation.  ABDOMEN: Bowel sounds are positive in all four quadrants, soft, tender,  especially in the left lower quadrant, with some guarding present.  EXTREMITIES:  No edema, cyanosis, or clubbing.  BACK:  Negative for CVA or bony tenderness.  NEUROLOGIC:  Cranial nerves II-XII grossly intact.   LABORATORY DATA:  CBC with differential drawn in our office: White count  20.3, hemoglobin 16.2,  hematocrit 46.5, platelet count 385.  CMET normal  except for sodium 134, amylase 49, lipase 19.   Plain films of abdomen flat and upright show an abnormal gas pattern.   CT scan was done of the abdomen and pelvis.  I do not have that report in  front of me, but called report did reveal diverticulitis and abscess  present.   ASSESSMENT:  1.  Diverticulitis with abscess formation.  2.  History of hypertension.  3.  History of adrenal carcinoma on the right.  4.  History of peptic ulcer disease, currently on no therapy.   PLAN:  The patient is admitted to Dr. Sherwood Gambler for IV fluids, IV Cipro, and IV  Flagyl, and pain management.  He will be given antiemetics as needed and  will continue all current home medications.       TH/MEDQ  D:  02/25/2005  T:  02/25/2005  Job:  [id number]

## 2011-11-14 ENCOUNTER — Telehealth (HOSPITAL_COMMUNITY): Payer: Self-pay | Admitting: Dietician

## 2011-11-14 NOTE — Telephone Encounter (Signed)
Received fax from RayAnn Edmonds of [facility] inquiring if pt had been seen. There is no record of appointment or visit for [facility] diabetes class or one-on-one counseling with [facility] RD from January 4004 to present. This information was faxed back to RayAnn for [facility]'s records.  

## 2013-05-11 ENCOUNTER — Telehealth (HOSPITAL_COMMUNITY): Payer: Self-pay | Admitting: Dietician

## 2013-05-11 NOTE — Telephone Encounter (Signed)
Returned call and left message on voicemail at [phone].

## 2013-05-11 NOTE — Telephone Encounter (Signed)
Received message from pt left on 05/10/13 at 1704.

## 2013-05-12 NOTE — Telephone Encounter (Signed)
No further response from pt. Will close out.  

## 2014-09-22 ENCOUNTER — Ambulatory Visit (HOSPITAL_COMMUNITY)
Admission: RE | Admit: 2014-09-22 | Discharge: 2014-09-22 | Disposition: A | Discharge status: Home / Self Care | Payer: Commercial Managed Care - PPO | Source: Ambulatory Visit | Attending: Family Medicine | Admitting: Family Medicine

## 2014-09-22 ENCOUNTER — Other Ambulatory Visit (HOSPITAL_COMMUNITY): Payer: Self-pay | Admitting: Family Medicine

## 2014-09-22 DIAGNOSIS — M25562 Pain in left knee: Secondary | ICD-10-CM

## 2015-04-27 ENCOUNTER — Other Ambulatory Visit (HOSPITAL_COMMUNITY): Payer: Self-pay | Admitting: Family Medicine

## 2015-04-27 ENCOUNTER — Ambulatory Visit (HOSPITAL_COMMUNITY)
Admission: RE | Admit: 2015-04-27 | Discharge: 2015-04-27 | Disposition: A | Discharge status: Home / Self Care | Payer: Commercial Managed Care - PPO | Source: Ambulatory Visit | Attending: Family Medicine | Admitting: Family Medicine

## 2015-04-27 DIAGNOSIS — M25532 Pain in left wrist: Secondary | ICD-10-CM

## 2016-04-22 ENCOUNTER — Encounter: Payer: Self-pay | Admitting: Family Medicine

## 2016-04-22 ENCOUNTER — Ambulatory Visit (INDEPENDENT_AMBULATORY_CARE_PROVIDER_SITE_OTHER): Payer: 59 | Admitting: Family Medicine

## 2016-04-22 ENCOUNTER — Encounter (INDEPENDENT_AMBULATORY_CARE_PROVIDER_SITE_OTHER): Payer: Self-pay

## 2016-04-22 VITALS — BP 150/94 | HR 100 | Temp 98.8°F | Resp 20 | Ht 69.0 in | Wt 275.0 lb

## 2016-04-22 DIAGNOSIS — Z23 Encounter for immunization: Secondary | ICD-10-CM | POA: Diagnosis not present

## 2016-04-22 DIAGNOSIS — I10 Essential (primary) hypertension: Secondary | ICD-10-CM

## 2016-04-22 DIAGNOSIS — E119 Type 2 diabetes mellitus without complications: Secondary | ICD-10-CM | POA: Diagnosis not present

## 2016-04-22 DIAGNOSIS — E785 Hyperlipidemia, unspecified: Secondary | ICD-10-CM

## 2016-04-22 DIAGNOSIS — Z7189 Other specified counseling: Secondary | ICD-10-CM | POA: Diagnosis not present

## 2016-04-22 DIAGNOSIS — G629 Polyneuropathy, unspecified: Secondary | ICD-10-CM | POA: Diagnosis not present

## 2016-04-22 DIAGNOSIS — Z7689 Persons encountering health services in other specified circumstances: Secondary | ICD-10-CM

## 2016-04-22 MED ORDER — GABAPENTIN 300 MG PO CAPS: 300.0000 mg | ORAL_CAPSULE | Freq: Three times a day (TID) | ORAL | 3 refills | Status: DC

## 2016-04-22 MED ORDER — EPINEPHRINE 0.3 MG/0.3ML IJ SOAJ: 0.3000 mg | Freq: Once | INTRAMUSCULAR | 2 refills | Status: AC

## 2016-04-22 MED ORDER — ROPINIROLE HCL 1 MG PO TABS: 2.0000 mg | ORAL_TABLET | Freq: Every day | ORAL | 5 refills | Status: DC

## 2016-04-22 MED ORDER — METFORMIN HCL 1000 MG PO TABS: 1000.0000 mg | ORAL_TABLET | Freq: Two times a day (BID) | ORAL | 5 refills | Status: DC

## 2016-04-22 NOTE — Progress Notes (Signed)
Subjective:    Patient ID: Jason French, male    DOB: 1961-06-17, 55 y.o.   MRN: [id number]  HPI Patient is here today to establish care. 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 The mass was removed and he was followed for 10 years with periodic CT scans and MRIs. He states that this is resolved. However I have no old records to review. He is also diabetic who takes metformin. He denies any hypoglycemic episodes. He denies any polyuria, polydipsia, or blurred vision. His blood pressure is elevated today but he states this usually less than 140/90 at home. He denies any chest pain shortness of breath or dyspnea on exertion. He is also on Lipitor for hyperlipidemia. He denies any myalgias or right upper quadrant pain. He does have a large surgical scar in the right upper quadrant where the adrenal mass was removed. Past medical history significant for diverticulitis status post rupture and abscess formation. This required operative drainage and also the patient states that a portion of his colon was removed. Last colonoscopy was less than 10 years ago. He is due for PSA. He refuses a flu shot. He refuses pneumonia vaccine Past Medical History:  Diagnosis Date  . Diabetes mellitus without complication ([place])   . Diverticulitis   . Hyperlipidemia   . Hypertension    Past Surgical History:  Procedure Laterality Date  . COLON SURGERY     No current outpatient prescriptions on file prior to visit.   No current facility-administered medications on file prior to visit.    No Known Allergies Social History   Social History  . Marital status: Married    Spouse name: N/A  . Number of children: N/A  . Years of education: N/A   Occupational History  . Not on file.   Social History Main Topics  . Smoking status: Current Every Day Smoker  . Smokeless tobacco: Never Used  .  Alcohol use Yes     Comment: Socail  . Drug use: No  . Sexual activity: Not on file   Other Topics Concern  . Not on file   Social History Narrative  . No narrative on file      Review of Systems  All other systems reviewed and are negative.      Objective:   Physical Exam  Constitutional: He is oriented to person, place, and time. He appears well-developed and well-nourished. No distress.  HENT:  Head: Normocephalic and atraumatic.  Right Ear: External ear normal.  Left Ear: External ear normal.  Nose: Nose normal.  Mouth/Throat: Oropharynx is clear and moist. No oropharyngeal exudate.  Eyes: Conjunctivae and EOM are normal. Pupils are equal, round, and reactive to light. Right eye exhibits no discharge. Left eye exhibits no discharge. No scleral icterus.  Neck: Normal range of motion. Neck supple. No JVD present. No tracheal deviation present. No thyromegaly present.  Cardiovascular: Normal rate, regular rhythm, normal heart sounds and intact distal pulses.  Exam reveals no gallop and no friction rub.   No murmur heard. Pulmonary/Chest: Effort normal and breath sounds normal. No stridor. No respiratory distress. He has no wheezes. He has no rales. He exhibits no tenderness.  Abdominal: Soft. Bowel sounds are normal. He exhibits no distension and no mass. There is no tenderness. There is no rebound and no guarding.  Musculoskeletal: Normal range  of motion. He exhibits no edema, tenderness or deformity.  Lymphadenopathy:    He has no cervical adenopathy.  Neurological: He is alert and oriented to person, place, and time. He has normal reflexes. He displays normal reflexes. No cranial nerve deficit. He exhibits normal muscle tone. Coordination normal.  Skin: Skin is warm. No rash noted. He is not diaphoretic. No erythema. No pallor.  Psychiatric: He has a normal mood and affect. His behavior is normal. Judgment and thought content normal.  Vitals reviewed.           Assessment & Plan:  Encounter to establish care with new doctor - Plan: PSA  Benign essential HTN  HLD (hyperlipidemia)  Controlled type 2 diabetes mellitus without complication, without long-term current use of insulin ([place]) - Plan: CBC with Differential/Platelet, COMPLETE METABOLIC PANEL WITH GFR, Lipid panel, Hemoglobin A1c, Microalbumin, urine  Peripheral polyneuropathy ([place]) - Plan: gabapentin (NEURONTIN) 300 MG capsule  Need for Tdap vaccination - Plan: Tdap vaccine greater than or equal to 7yo IM  Blood pressure is elevated today. Patient is confident is better at home. He does report feeling nervous today. He will check his blood pressure frequently at home and notify me of the values. Return fasting for a CBC, CMP, fasting lipid panel, and a PSA. I will also check a hemoglobin A1c as well as a urine microalbumin. If A1c is acceptable, consider switching metformin to the toes of for weight loss. Patient's colonoscopy is up-to-date. He refuses a flu shot as well as the pneumonia shot. I will check a PSA to screen for prostate cancer. I have asked the patient to sign a release of information form so that I can receive his old records pertaining to the adrenal cancer that was removed from his abdomen more than 10 years ago.  Patient has restless leg syndrome and symptoms are not well controlled. He also complains of some numbness and tingling in his feet as well as some neuropathic pain in his feet at night. Therefore I'll add gabapentin initially 300 mg by mouth daily at bedtime. Patient may take more often if necessary for nerve pain

## 2016-04-25 ENCOUNTER — Encounter (INDEPENDENT_AMBULATORY_CARE_PROVIDER_SITE_OTHER): Payer: Self-pay

## 2016-04-25 ENCOUNTER — Other Ambulatory Visit: Payer: 59

## 2016-04-25 LAB — CBC WITH DIFFERENTIAL/PLATELET
Basophils Absolute: 0 cells/uL (ref 0–200)
Basophils Relative: 0 %
Eosinophils Absolute: 456 cells/uL (ref 15–500)
Eosinophils Relative: 4 %
HCT: 46.2 % (ref 38.5–50.0)
Hemoglobin: 15.5 g/dL (ref 13.0–17.0)
Lymphocytes Relative: 31 %
Lymphs Abs: 3534 cells/uL (ref 850–3900)
MCH: 30.1 pg (ref 27.0–33.0)
MCHC: 33.5 g/dL (ref 32.0–36.0)
MCV: 89.7 fL (ref 80.0–100.0)
MPV: 9.2 fL (ref 7.5–12.5)
Monocytes Absolute: 1368 cells/uL — ABNORMAL HIGH (ref 200–950)
Monocytes Relative: 12 %
Neutro Abs: 6042 cells/uL (ref 1500–7800)
Neutrophils Relative %: 53 %
Platelets: 297 10*3/uL (ref 140–400)
RBC: 5.15 MIL/uL (ref 4.20–5.80)
RDW: 13.7 % (ref 11.0–15.0)
WBC: 11.4 10*3/uL — ABNORMAL HIGH (ref 3.8–10.8)

## 2016-04-25 LAB — COMPLETE METABOLIC PANEL WITH GFR
ALT: 13 U/L (ref 9–46)
AST: 14 U/L (ref 10–35)
Albumin: 4 g/dL (ref 3.6–5.1)
Alkaline Phosphatase: 75 U/L (ref 40–115)
BUN: 12 mg/dL (ref 7–25)
CO2: 24 mmol/L (ref 20–31)
Calcium: 9.2 mg/dL (ref 8.6–10.3)
Chloride: 102 mmol/L (ref 98–110)
Creat: 0.87 mg/dL (ref 0.70–1.33)
GFR, Est African American: >89 mL/min (ref >=60)
GFR, Est Non African American: >89 mL/min (ref >=60)
Glucose, Bld: 104 mg/dL — ABNORMAL HIGH (ref 70–99)
Potassium: 4.5 mmol/L (ref 3.5–5.3)
Sodium: 139 mmol/L (ref 135–146)
Total Bilirubin: 0.7 mg/dL (ref 0.2–1.2)
Total Protein: 6.5 g/dL (ref 6.1–8.1)

## 2016-04-25 LAB — LIPID PANEL
Cholesterol: 102 mg/dL — ABNORMAL LOW (ref 125–200)
HDL: 31 mg/dL — ABNORMAL LOW (ref >=40)
LDL Cholesterol: 42 mg/dL (ref <130)
Total CHOL/HDL Ratio: 3.3 Ratio (ref <=5.0)
Triglycerides: 144 mg/dL (ref <150)
VLDL: 29 mg/dL (ref <30)

## 2016-04-26 LAB — HEMOGLOBIN A1C
Hgb A1c MFr Bld: 6.1 % — ABNORMAL HIGH (ref <5.7)
Mean Plasma Glucose: 128 mg/dL

## 2016-04-26 LAB — MICROALBUMIN, URINE: Microalb, Ur: 4.1 mg/dL

## 2016-04-26 LAB — PSA: PSA: 0.9 ng/mL (ref <=4.0)

## 2016-05-08 ENCOUNTER — Telehealth: Payer: Self-pay | Admitting: Family Medicine

## 2016-05-08 NOTE — Telephone Encounter (Signed)
Patient calling to speak to you regarding his metformin  [phone]

## 2016-05-09 NOTE — Telephone Encounter (Signed)
LMTRC

## 2016-05-10 MED ORDER — INSULIN PEN NEEDLE 31G X 8 MM MISC: 5 refills | Status: DC

## 2016-05-10 MED ORDER — LIRAGLUTIDE 18 MG/3ML ~~LOC~~ SOPN: PEN_INJECTOR | SUBCUTANEOUS | 5 refills | Status: DC

## 2016-05-10 NOTE — Telephone Encounter (Signed)
Patient calling back states you have been playing phone tag.  CB# [phone]

## 2016-05-10 NOTE — Telephone Encounter (Signed)
Pt aware of labs and med - sent to pharm  

## 2016-06-11 ENCOUNTER — Other Ambulatory Visit: Payer: Self-pay | Admitting: Family Medicine

## 2016-08-08 ENCOUNTER — Other Ambulatory Visit: Payer: Self-pay | Admitting: Family Medicine

## 2016-08-08 ENCOUNTER — Other Ambulatory Visit: Payer: 59

## 2016-08-08 ENCOUNTER — Telehealth: Payer: Self-pay | Admitting: Family Medicine

## 2016-08-08 DIAGNOSIS — I10 Essential (primary) hypertension: Secondary | ICD-10-CM

## 2016-08-08 DIAGNOSIS — E119 Type 2 diabetes mellitus without complications: Secondary | ICD-10-CM

## 2016-08-08 DIAGNOSIS — E785 Hyperlipidemia, unspecified: Secondary | ICD-10-CM

## 2016-08-08 NOTE — Telephone Encounter (Signed)
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

## 2016-08-09 ENCOUNTER — Other Ambulatory Visit: Payer: 59

## 2016-08-09 ENCOUNTER — Ambulatory Visit (INDEPENDENT_AMBULATORY_CARE_PROVIDER_SITE_OTHER): Payer: 59 | Admitting: Family Medicine

## 2016-08-09 ENCOUNTER — Encounter: Payer: Self-pay | Admitting: Family Medicine

## 2016-08-09 VITALS — BP 142/84 | HR 108 | Temp 98.7°F | Resp 20 | Ht 69.0 in | Wt 284.0 lb

## 2016-08-09 DIAGNOSIS — E119 Type 2 diabetes mellitus without complications: Secondary | ICD-10-CM | POA: Diagnosis not present

## 2016-08-09 DIAGNOSIS — G629 Polyneuropathy, unspecified: Secondary | ICD-10-CM

## 2016-08-09 DIAGNOSIS — E7800 Pure hypercholesterolemia, unspecified: Secondary | ICD-10-CM | POA: Diagnosis not present

## 2016-08-09 DIAGNOSIS — E785 Hyperlipidemia, unspecified: Secondary | ICD-10-CM

## 2016-08-09 DIAGNOSIS — I10 Essential (primary) hypertension: Secondary | ICD-10-CM

## 2016-08-09 LAB — COMPREHENSIVE METABOLIC PANEL
ALBUMIN: 3.8 g/dL (ref 3.6–5.1)
ALK PHOS: 71 U/L (ref 40–115)
ALT: 18 U/L (ref 9–46)
AST: 18 U/L (ref 10–35)
BILIRUBIN TOTAL: 0.5 mg/dL (ref 0.2–1.2)
BUN: 12 mg/dL (ref 7–25)
CO2: 26 mmol/L (ref 20–31)
CREATININE: 0.86 mg/dL (ref 0.70–1.33)
Calcium: 9.3 mg/dL (ref 8.6–10.3)
Chloride: 106 mmol/L (ref 98–110)
Glucose, Bld: 116 mg/dL — ABNORMAL HIGH (ref 70–99)
Potassium: 4.5 mmol/L (ref 3.5–5.3)
SODIUM: 137 mmol/L (ref 135–146)
Total Protein: 6.5 g/dL (ref 6.1–8.1)

## 2016-08-09 LAB — LIPID PANEL
Cholesterol: 131 mg/dL (ref <200)
HDL: 31 mg/dL — ABNORMAL LOW (ref >40)
LDL CALC: 63 mg/dL (ref <100)
TRIGLYCERIDES: 184 mg/dL — AB (ref <150)
Total CHOL/HDL Ratio: 4.2 Ratio (ref <5.0)
VLDL: 37 mg/dL — ABNORMAL HIGH (ref <30)

## 2016-08-09 LAB — HEMOGLOBIN A1C
Hgb A1c MFr Bld: 6.3 % — ABNORMAL HIGH (ref <5.7)
Mean Plasma Glucose: 134 mg/dL

## 2016-08-09 MED ORDER — EMPAGLIFLOZIN 25 MG PO TABS: 25.0000 mg | ORAL_TABLET | Freq: Every day | ORAL | 6 refills | Status: DC

## 2016-08-09 NOTE — Progress Notes (Signed)
Subjective:    Patient ID: Jason French, male    DOB: 1960/10/31, 56 y.o.   MRN: [id number]  HPI  04/22/16 Patient is here today to establish care. 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 The mass was removed and he was followed for 10 years with periodic CT scans and MRIs. He states that this is resolved. However I have no old records to review. He is also diabetic who takes metformin. He denies any hypoglycemic episodes. He denies any polyuria, polydipsia, or blurred vision. His blood pressure is elevated today but he states this usually less than 140/90 at home. He denies any chest pain shortness of breath or dyspnea on exertion. He is also on Lipitor for hyperlipidemia. He denies any myalgias or right upper quadrant pain. He does have a large surgical scar in the right upper quadrant where the adrenal mass was removed. Past medical history significant for diverticulitis status post rupture and abscess formation. This required operative drainage and also the patient states that a portion of his colon was removed. Last colonoscopy was less than 10 years ago. He is due for PSA. He refuses a flu shot. He refuses pneumonia vaccine.  At that time, my plan was: Blood pressure is elevated today. Patient is confident is better at home. He does report feeling nervous today. He will check his blood pressure frequently at home and notify me of the values. Return fasting for a CBC, CMP, fasting lipid panel, and a PSA. I will also check a hemoglobin A1c as well as a urine microalbumin. If A1c is acceptable, consider switching metformin to the toes of for weight loss. Patient's colonoscopy is up-to-date. He refuses a flu shot as well as the pneumonia shot. I will check a PSA to screen for prostate cancer. I have asked the patient to sign a release of information form so that I can receive his old  records pertaining to the adrenal cancer that was removed from his abdomen more than 10 years ago.  Patient has restless leg syndrome and symptoms are not well controlled. He also complains of some numbness and tingling in his feet as well as some neuropathic pain in his feet at night. Therefore I'll add gabapentin initially 300 mg by mouth daily at bedtime. Patient may take more often if necessary for nerve pain  08/09/16 Has not seen any benefit in managing his neuropathy since starting gabapentin. He continues to have pins and needles pain and tingling in his feet on a daily basis. He is interested in trying to increase the dose. He has been taking Vick toes of as we discussed at his last visit. His hemoglobin A1c was outstanding at 6.1 and therefore we discontinued metformin and replaced with Victoza. Unfortunately he has actually gained weight since switching to Vick toes a. Furthermore he is having hypoglycemic episodes. He is interesting in trying another alternative. His blood pressure is elevated today but he swears this is due to whitecoat syndrome. I need to correct his medication list listed below. He is also taking benazepril 40 mg a day not 20 mg a day. He denies any chest pain shortness of breath or dyspnea on exertion Past Medical History:  Diagnosis Date  . Cancer [facility])    adrenal cancer  . Diabetes mellitus without complication ([place])   . Diverticulitis   . Hyperlipidemia   .  Hypertension   . Neuropathy ([place])   . RLS (restless legs syndrome)    Past Surgical History:  Procedure Laterality Date  . COLON SURGERY    . RESECTION OF ABDOMINAL MASS     adrenal mass right (cancer)   Current Outpatient Prescriptions on File Prior to Visit  Medication Sig Dispense Refill  . atorvastatin (LIPITOR) 10 MG tablet Take 10 mg by mouth daily.     . benazepril (LOTENSIN) 20 MG tablet Take 20 mg by mouth daily.     Marland Kitchen buPROPion (WELLBUTRIN XL) 300 MG 24 hr tablet TAKE ONE TABLET BY MOUTH ONCE  DAILY. 30 tablet 0  . gabapentin (NEURONTIN) 300 MG capsule Take 1 capsule (300 mg total) by mouth 3 (three) times daily. 90 capsule 3  . Insulin Pen Needle 31G X 8 MM MISC Use QD for victoza 100 each 5  . liraglutide (VICTOZA) 18 MG/3ML SOPN Inject .6mg  SQ daily x 1 week then 1.2mg  sq qd 9 mL 5  . metFORMIN (GLUCOPHAGE) 1000 MG tablet Take 1 tablet (1,000 mg total) by mouth 2 (two) times daily with a meal. 60 tablet 5  . rOPINIRole (REQUIP) 1 MG tablet Take 2 tablets (2 mg total) by mouth at bedtime. 60 tablet 5   No current facility-administered medications on file prior to visit.    No Known Allergies Social History   Social History  . Marital status: Married    Spouse name: N/A  . Number of children: N/A  . Years of education: N/A   Occupational History  . Not on file.   Social History Main Topics  . Smoking status: Current Every Day Smoker  . Smokeless tobacco: Never Used  . Alcohol use Yes     Comment: Socail  . Drug use: No  . Sexual activity: Yes   Other Topics Concern  . Not on file   Social History Narrative  . No narrative on file      Review of Systems  All other systems reviewed and are negative.      Objective:   Physical Exam  Constitutional: He is oriented to person, place, and time. He appears well-developed and well-nourished. No distress.  HENT:  Head: Normocephalic and atraumatic.  Right Ear: External ear normal.  Left Ear: External ear normal.  Nose: Nose normal.  Mouth/Throat: Oropharynx is clear and moist. No oropharyngeal exudate.  Eyes: Conjunctivae and EOM are normal. Pupils are equal, round, and reactive to light. Right eye exhibits no discharge. Left eye exhibits no discharge. No scleral icterus.  Neck: Normal range of motion. Neck supple. No JVD present. No tracheal deviation present. No thyromegaly present.  Cardiovascular: Normal rate, regular rhythm, normal heart sounds and intact distal pulses.  Exam reveals no gallop and no  friction rub.   No murmur heard. Pulmonary/Chest: Effort normal and breath sounds normal. No stridor. No respiratory distress. He has no wheezes. He has no rales. He exhibits no tenderness.  Abdominal: Soft. Bowel sounds are normal. He exhibits no distension and no mass. There is no tenderness. There is no rebound and no guarding.  Musculoskeletal: Normal range of motion. He exhibits no edema, tenderness or deformity.  Lymphadenopathy:    He has no cervical adenopathy.  Neurological: He is alert and oriented to person, place, and time. He has normal reflexes. No cranial nerve deficit. He exhibits normal muscle tone. Coordination normal.  Skin: Skin is warm. No rash noted. He is not diaphoretic. No erythema. No pallor.  Psychiatric:  He has a normal mood and affect. His behavior is normal. Judgment and thought content normal.  Vitals reviewed.         Assessment & Plan:  Pure hypercholesterolemia  Controlled type 2 diabetes mellitus without complication, without long-term current use of insulin ([facility])  Peripheral polyneuropathy ([facility])  Benign essential HTN   Check blood pressure on a daily basis and call me with the values in one week. If consistently greater than 140/90, I will add hydrochlorothiazide. I will recheck a hemoglobin A1c. However I'm going to discontinue Victoza and replace with Jardiance 25 mg a day. Furthermore he is no longer on metformin. Recheck hemoglobin A1c in 6 months. Work on diet neck size and weight loss. Increase gabapentin gradually to 600 mg by mouth 3 times a day for neuropathy

## 2016-08-12 ENCOUNTER — Encounter: Payer: Self-pay | Admitting: Family Medicine

## 2016-08-12 ENCOUNTER — Ambulatory Visit: Payer: 59 | Admitting: Family Medicine

## 2016-08-12 ENCOUNTER — Telehealth: Payer: Self-pay | Admitting: *Deleted

## 2016-08-12 MED ORDER — DAPAGLIFLOZIN PROPANEDIOL 10 MG PO TABS: 10.0000 mg | ORAL_TABLET | Freq: Every day | ORAL | 3 refills | Status: DC

## 2016-08-12 NOTE — Telephone Encounter (Signed)
Received request from pharmacy for [place] on Jardiance.   Formulary alternatives include: Augustina Mood  MD please advise.

## 2016-08-12 NOTE — Telephone Encounter (Signed)
Switch to farxiga 10 mg poqday

## 2016-08-12 NOTE — Telephone Encounter (Signed)
Call placed to patient and patient made aware.   New prescription sent to pharmacy.

## 2016-08-16 ENCOUNTER — Other Ambulatory Visit: Payer: Self-pay | Admitting: Family Medicine

## 2016-08-16 DIAGNOSIS — G629 Polyneuropathy, unspecified: Secondary | ICD-10-CM

## 2016-08-16 MED ORDER — GABAPENTIN 300 MG PO CAPS: 600.0000 mg | ORAL_CAPSULE | Freq: Three times a day (TID) | ORAL | 3 refills | Status: DC

## 2016-09-02 ENCOUNTER — Encounter: Payer: Self-pay | Admitting: Physician Assistant

## 2016-09-02 ENCOUNTER — Ambulatory Visit (INDEPENDENT_AMBULATORY_CARE_PROVIDER_SITE_OTHER): Payer: 59 | Admitting: Physician Assistant

## 2016-09-02 ENCOUNTER — Encounter: Payer: Self-pay | Admitting: Family Medicine

## 2016-09-02 VITALS — BP 150/92 | HR 104 | Temp 98.2°F | Resp 18 | Wt 281.6 lb

## 2016-09-02 DIAGNOSIS — F17200 Nicotine dependence, unspecified, uncomplicated: Secondary | ICD-10-CM | POA: Diagnosis not present

## 2016-09-02 DIAGNOSIS — B9689 Other specified bacterial agents as the cause of diseases classified elsewhere: Principal | ICD-10-CM

## 2016-09-02 DIAGNOSIS — J988 Other specified respiratory disorders: Secondary | ICD-10-CM

## 2016-09-02 MED ORDER — ALBUTEROL SULFATE HFA 108 (90 BASE) MCG/ACT IN AERS: 2.0000 | INHALATION_SPRAY | Freq: Four times a day (QID) | RESPIRATORY_TRACT | 2 refills | Status: DC | PRN

## 2016-09-02 MED ORDER — AZITHROMYCIN 250 MG PO TABS: ORAL_TABLET | ORAL | 0 refills | Status: DC

## 2016-09-02 MED ORDER — PREDNISONE 20 MG PO TABS: 20.0000 mg | ORAL_TABLET | Freq: Every day | ORAL | 0 refills | Status: DC

## 2016-09-02 NOTE — Progress Notes (Signed)
Patient ID: [name] MRN: [id number], DOB: Dec 19, 1960, 56 y.o. Date of Encounter: 09/02/2016, 4:13 PM    Chief Complaint:  Chief Complaint  Patient presents with  . cough congestion    x 2 wks     HPI: 57 y.o. year old male presents with above.   States that he also has had some mild sore throat, pressure behind his ears, nasal/head congestion and has been having chest congestion and cough. Patient states that at times he feels like he is wheezing. Smokes some but has been reducing the amount of smoking. Has had no fevers or chills. No body aches. Has been using multiple over-the-counter medicines. Says this is what is making his blood pressure high today.     Home Meds:   Outpatient Medications Prior to Visit  Medication Sig Dispense Refill  . atorvastatin (LIPITOR) 10 MG tablet Take 10 mg by mouth daily.     . benazepril (LOTENSIN) 20 MG tablet Take 40 mg by mouth daily.     Marland Kitchen buPROPion (WELLBUTRIN XL) 300 MG 24 hr tablet TAKE ONE TABLET BY MOUTH ONCE DAILY. 30 tablet 0  . dapagliflozin propanediol (FARXIGA) 10 MG TABS tablet Take 10 mg by mouth daily. 30 tablet 3  . gabapentin (NEURONTIN) 300 MG capsule Take 2 capsules (600 mg total) by mouth 3 (three) times daily. 180 capsule 3  . Insulin Pen Needle 31G X 8 MM MISC Use QD for victoza 100 each 5  . rOPINIRole (REQUIP) 1 MG tablet Take 2 tablets (2 mg total) by mouth at bedtime. 60 tablet 5  . liraglutide (VICTOZA) 18 MG/3ML SOPN Inject .6mg  SQ daily x 1 week then 1.2mg  sq qd (Patient not taking: Reported on 08/09/2016) 9 mL 5   No facility-administered medications prior to visit.     Allergies: No Known Allergies    Review of Systems: See HPI for pertinent ROS. All other ROS negative.    Physical Exam: Blood pressure (!) 150/92, pulse (!) 104, temperature 98.2 F (36.8 C), temperature source Oral, resp. rate 18, weight 281 lb 9.6 oz (127.7 kg), SpO2 98 %., Body mass index is 41.59 kg/m. General:  Obese WM.  Appears in no acute distress. HEENT: Normocephalic, atraumatic, eyes without discharge, sclera non-icteric, nares are without discharge. Bilateral auditory canals clear, TM's are without perforation, pearly grey and translucent with reflective cone of light bilaterally. Oral cavity moist, posterior pharynx without exudate, erythema, peritonsillar abscess. No tenderness with percussion to frontal or maxillary sinuses bilaterally.  Neck: Supple. No thyromegaly. No lymphadenopathy. Lungs: Clear bilaterally to auscultation without wheezes, rales, or rhonchi. Breathing is unlabored. No wheezing on exam at this time. Heart: Regular rhythm. No murmurs, rubs, or gallops. Msk:  Strength and tone normal for age. Extremities/Skin: Warm and dry. Neuro: Alert and oriented X 3. Moves all extremities spontaneously. Gait is normal. CNII-XII grossly in tact. Psych:  Responds to questions appropriately with a normal affect.     ASSESSMENT AND PLAN:  56 y.o. year old male with  1. Bacterial respiratory infection He is to take the azithromycin and prednisone as directed. Also can use the albuterol as directed as needed. Follow-up if symptoms worsen or if symptoms do not resolve upon completion of antibiotic and prednisone. - azithromycin (ZITHROMAX) 250 MG tablet; Day : Take 2 daily. Days 2-5: Take 1 daily.  Dispense: 6 tablet; Refill: 0 - predniSONE (DELTASONE) 20 MG tablet; Take 1 tablet (20 mg total) by mouth daily with breakfast.  Dispense:  5 tablet; Refill: 0 - albuterol (PROVENTIL HFA;VENTOLIN HFA) 108 (90 Base) MCG/ACT inhaler; Inhale 2 puffs into the lungs every 6 (six) hours as needed for wheezing or shortness of breath.  Dispense: 1 Inhaler; Refill: 2  2. Smoker - predniSONE (DELTASONE) 20 MG tablet; Take 1 tablet (20 mg total) by mouth daily with breakfast.  Dispense: 5 tablet; Refill: 0 - albuterol (PROVENTIL HFA;VENTOLIN HFA) 108 (90 Base) MCG/ACT inhaler; Inhale 2 puffs into the lungs every 6 (six)  hours as needed for wheezing or shortness of breath.  Dispense: 1 Inhaler; Refill: 2   Signed, [address] [place], [place], [facility] 09/02/2016 4:13 PM

## 2016-09-17 ENCOUNTER — Telehealth: Payer: Self-pay | Admitting: Family Medicine

## 2016-09-17 NOTE — Telephone Encounter (Signed)
Spoke to pt and he states that since starting the Victoza his bs has been high. Today fasting it was 450 and he has been having some blurred vision and frequent urination. 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 He is down to 262lbs from [place].   Call and LMOVM at home number for pt with any changes to meds.

## 2016-09-17 NOTE — Telephone Encounter (Signed)
Patient would like to speak to you regarding his sugar reading being high [phone]

## 2016-09-19 NOTE — Telephone Encounter (Signed)
A1c was 6.3 in January which is good.  Does he think the prednisone MBD put him on may have increased his sugars.  What are they now?

## 2016-10-19 ENCOUNTER — Other Ambulatory Visit: Payer: Self-pay | Admitting: Family Medicine

## 2016-11-23 ENCOUNTER — Other Ambulatory Visit: Payer: Self-pay | Admitting: Family Medicine

## 2016-11-23 DIAGNOSIS — G629 Polyneuropathy, unspecified: Secondary | ICD-10-CM

## 2016-12-20 ENCOUNTER — Encounter: Payer: Self-pay | Admitting: Family Medicine

## 2017-01-18 ENCOUNTER — Other Ambulatory Visit: Payer: Self-pay | Admitting: Family Medicine

## 2017-01-24 ENCOUNTER — Encounter: Payer: Self-pay | Admitting: Family Medicine

## 2017-01-24 ENCOUNTER — Ambulatory Visit: Payer: 59 | Admitting: Family Medicine

## 2017-01-24 ENCOUNTER — Ambulatory Visit (INDEPENDENT_AMBULATORY_CARE_PROVIDER_SITE_OTHER): Payer: 59 | Admitting: Family Medicine

## 2017-01-24 VITALS — BP 138/72 | HR 100 | Temp 97.9°F | Resp 14 | Ht 69.0 in | Wt 274.0 lb

## 2017-01-24 DIAGNOSIS — G8929 Other chronic pain: Secondary | ICD-10-CM

## 2017-01-24 DIAGNOSIS — M25561 Pain in right knee: Secondary | ICD-10-CM

## 2017-01-24 MED ORDER — DICLOFENAC SODIUM 75 MG PO TBEC: 75.0000 mg | DELAYED_RELEASE_TABLET | Freq: Two times a day (BID) | ORAL | 1 refills | Status: DC

## 2017-01-24 MED ORDER — HYDROCODONE-ACETAMINOPHEN 5-325 MG PO TABS: 1.0000 | ORAL_TABLET | Freq: Four times a day (QID) | ORAL | 0 refills | Status: DC | PRN

## 2017-01-24 NOTE — Progress Notes (Signed)
   Subjective:    Patient ID: Jason French, male    DOB: 11-Nov-1960, 56 y.o.   MRN: [id number]  Patient presents for R Leg Pain (x months- states that he has R knee and leg pain) and Skin Tags (reports increased skin tags and would like them removed under arms)  Patient with right leg knee pain has been going on for many months. States he has just had pain on and off for years has never had excess. He drives long distance truck. He tries to get up to move around but often his leg and knee is quite stiff and painful. He occasionally has symptoms ago from his hip down his leg but mostly is around his knee. He does get some swelling at the knee as well. He has been taking over-the-counter anti-inflammatories but now is unable to the sleep at times due to pain and if he is walking long distances gets significant pain. He denies any giving out of the knee. Denies any tingling or numbness in his feet  He has multiple skin tags in his axilla region that he would like to have removed. He is going to follow visit for this.    Review Of Systems:  GEN- denies fatigue, fever, weight loss,weakness, recent illness HEENT- denies eye drainage, change in vision, nasal discharge, CVS- denies chest pain, palpitations RESP- denies SOB, cough, wheeze ABD- denies N/V, change in stools, abd pain GU- denies dysuria, hematuria, dribbling, incontinence MSK- + joint pain, muscle aches, injury Neuro- denies headache, dizziness, syncope, seizure activity       Objective:    BP 138/72   Pulse 100   Temp 97.9 F (36.6 C) (Oral)   Resp 14   Ht 5\' 9"  (1.753 m)   Wt 274 lb (124.3 kg)   SpO2 98%   BMI 40.46 kg/m  GEN- NAD, alert and oriented x3 MSK- Spine NT, fair ROM spine, neg SLR Fair ROM bilat hips, decreased ROM Right knee compared to left, small effusion,ligments in tact, no crepitus  mild antalgic gait  Ext- no edema         Assessment & Plan:      Problem List Items Addressed This Visit    None    Visit Diagnoses    Chronic pain of right knee    -  Primary   Obtain xray of knee, he has mild effusion today, he is leaving for vacation today, given diclofenac and norco short term.pending xray will see if orthopedics needed   Relevant Orders   DG Knee Complete 4 Views Right      Note: This dictation was prepared with Dragon dictation along with smaller phrase technology. Any transcriptional errors that result from this process are unintentional.

## 2017-01-24 NOTE — Patient Instructions (Addendum)
[name]- get xrays done Go Tuesday evening or Wed morning  Take anti-inflammatory ICE knee  Take pain medicine at bedtime as needed F/U pending results

## 2017-01-27 ENCOUNTER — Encounter: Payer: Self-pay | Admitting: Internal Medicine

## 2017-01-28 ENCOUNTER — Other Ambulatory Visit: Payer: Self-pay | Admitting: Family Medicine

## 2017-01-28 DIAGNOSIS — G629 Polyneuropathy, unspecified: Secondary | ICD-10-CM

## 2017-01-30 ENCOUNTER — Ambulatory Visit (HOSPITAL_COMMUNITY)
Admission: RE | Admit: 2017-01-30 | Discharge: 2017-01-30 | Disposition: A | Discharge status: Home / Self Care | Payer: 59 | Source: Ambulatory Visit | Attending: Family Medicine | Admitting: Family Medicine

## 2017-01-30 DIAGNOSIS — G8929 Other chronic pain: Secondary | ICD-10-CM | POA: Insufficient documentation

## 2017-01-30 DIAGNOSIS — M1711 Unilateral primary osteoarthritis, right knee: Secondary | ICD-10-CM | POA: Diagnosis not present

## 2017-01-30 DIAGNOSIS — M25561 Pain in right knee: Secondary | ICD-10-CM | POA: Diagnosis not present

## 2017-02-03 ENCOUNTER — Other Ambulatory Visit: Payer: Self-pay | Admitting: *Deleted

## 2017-02-03 DIAGNOSIS — M1710 Unilateral primary osteoarthritis, unspecified knee: Secondary | ICD-10-CM

## 2017-02-13 ENCOUNTER — Encounter: Payer: Self-pay | Admitting: Orthopaedic Surgery

## 2017-02-13 ENCOUNTER — Ambulatory Visit (INDEPENDENT_AMBULATORY_CARE_PROVIDER_SITE_OTHER): Payer: 59 | Admitting: Orthopaedic Surgery

## 2017-02-13 VITALS — BP 131/96 | HR 97 | Temp 97.0°F | Ht 71.5 in | Wt 272.0 lb

## 2017-02-13 DIAGNOSIS — M25561 Pain in right knee: Secondary | ICD-10-CM

## 2017-02-13 DIAGNOSIS — G8929 Other chronic pain: Secondary | ICD-10-CM

## 2017-02-13 DIAGNOSIS — F17210 Nicotine dependence, cigarettes, uncomplicated: Secondary | ICD-10-CM | POA: Diagnosis not present

## 2017-02-13 DIAGNOSIS — S83241A Other tear of medial meniscus, current injury, right knee, initial encounter: Secondary | ICD-10-CM | POA: Diagnosis not present

## 2017-02-13 MED ORDER — HYDROCODONE-ACETAMINOPHEN 5-325 MG PO TABS: ORAL_TABLET | ORAL | 0 refills | Status: DC

## 2017-02-13 NOTE — Progress Notes (Signed)
Subjective:    Patient ID: Jason French, male    DOB: [birth year], 56 y.o.   MRN: [id number]  HPI He has had pain in his right knee for about four to five months that is getting worse.  He has had pain on and off of the knee for several years but is has gotten to the point that he finally sought medical help.  He has swelling and popping and more recently giving way.  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  He has to pull himself up into the truck and be very careful in getting out.  He has no trauma, no redness.  He has tried ice, heat, rest, rubs, Tylenol and Advil with little help.  He saw Dr. Buelah Manis on 01-24-17 and then had x-rays done on 01-30-17.  X-ray report shows: Diffuse tricompartment degenerative change. No evidence of fracture or dislocation. No acute abnormality.  I have reviewed Dr. Dorian Heckle notes and the x-rays and x-ray report.  He is on diclofenac from Dr. Buelah Manis and he says he sees little benefit.  He is very concerned his knee gives way so much and stays swollen.   Review of Systems  HENT: Negative for congestion.   Respiratory: Negative for cough and shortness of breath.   Cardiovascular: Negative for chest pain and leg swelling.  Endocrine: Negative for cold intolerance.  Musculoskeletal: Positive for arthralgias, gait problem and joint swelling.  Allergic/Immunologic: Negative for environmental allergies.   Past Medical History:  Diagnosis Date  . Cancer [facility])    adrenal cancer  . Diabetes mellitus without complication ([place])   . Diverticulitis   . Hyperlipidemia   . Hypertension   . Neuropathy   . RLS (restless legs syndrome)     Past Surgical History:  Procedure Laterality Date  . COLON SURGERY    . RESECTION OF ABDOMINAL MASS     adrenal mass right (cancer)    Current Outpatient Prescriptions on File Prior to Visit  Medication Sig Dispense Refill  . atorvastatin (LIPITOR) 10 MG tablet TAKE (1) TABLET BY  MOUTH AT BEDTIME. 30 tablet 0  . benazepril (LOTENSIN) 20 MG tablet Take 40 mg by mouth daily.     . diclofenac (VOLTAREN) 75 MG EC tablet Take 1 tablet (75 mg total) by mouth 2 (two) times daily. 60 tablet 1  . gabapentin (NEURONTIN) 300 MG capsule TAKE 2 CAPSULES BY MOUTH THREE TIMES A DAY. 180 capsule 0  . metFORMIN (GLUCOPHAGE) 1000 MG tablet Take 1,000 mg by mouth 2 (two) times daily with a meal.    . rOPINIRole (REQUIP) 1 MG tablet TAKE (2) TABLETS BY MOUTH AT BEDTIME. 60 tablet 5   No current facility-administered medications on file prior to visit.     Social History   Social History  . Marital status: Married    Spouse name: N/A  . Number of children: N/A  . Years of education: N/A   Occupational History  . Not on file.   Social History Main Topics  . Smoking status: Current Every Day Smoker  . Smokeless tobacco: Never Used  . Alcohol use Yes     Comment: Socail  . Drug use: No  . Sexual activity: Yes   Other Topics Concern  . Not on file   Social History Narrative  . No narrative on file    Family history of diabetes and hypertension.  His father had colon cancer.  BP (!) 131/96   Pulse 97   Temp (!) 97 F (36.1 C)   Ht 5' 11.5" (1.816 m)   Wt 272 lb (123.4 kg)   BMI 37.41 kg/m      Objective:   Physical Exam  Constitutional: He is oriented to person, place, and time. He appears well-developed and well-nourished.  HENT:  Head: Normocephalic and atraumatic.  Eyes: Pupils are equal, round, and reactive to light. Conjunctivae and EOM are normal.  Neck: Normal range of motion. Neck supple.  Cardiovascular: Normal rate, regular rhythm and intact distal pulses.   Pulmonary/Chest: Effort normal.  Abdominal: Soft.  Musculoskeletal: He exhibits tenderness (Pain right knee, effusion 1+, ROM 0 to 105 with medial joint pain, positive medial McMurray and 1/2+ drawer sign, limp to the right, NV intact.  No distal edema.  Left knee negative.).  Neurological: He  is alert and oriented to person, place, and time. He has normal reflexes. He displays normal reflexes. No cranial nerve deficit. He exhibits normal muscle tone. Coordination normal.  Skin: Skin is warm and dry.  Psychiatric: He has a normal mood and affect. His behavior is normal. Judgment and thought content normal.  Vitals reviewed.   He smokes and has problems trying to stop.      Assessment & Plan:   Encounter Diagnoses  Name Primary?  . Chronic pain of right knee Yes  . Cigarette nicotine dependence without complication    PROCEDURE NOTE:  The patient requests injections of the right knee , verbal consent was obtained.  The right knee was prepped appropriately after time out was performed.   Sterile technique was observed and injection of 1 cc of Depo-Medrol 40 mg with several cc's of plain xylocaine. Anesthesia was provided by ethyl chloride and a 20-gauge needle was used to inject the knee area. The injection was tolerated well.  A band aid dressing was applied.  The patient was advised to apply ice later today and tomorrow to the injection sight as needed.  I would like to get a MRI of the right knee. He has giving way, swelling, effusion, no help with medication.  I am concerned about medial meniscus tear.  Call if any problem.  Precautions discussed.   Electronically Signed Sanjuana Kava, MD 7/19/20188:49 AM

## 2017-02-13 NOTE — Patient Instructions (Signed)
Steps to Quit Smoking Smoking tobacco can be bad for your health. It can also affect almost every organ in your body. Smoking puts you and people around you at risk for many serious long-lasting (chronic) diseases. Quitting smoking is hard, but it is one of the best things that you can do for your health. It is never too late to quit. What are the benefits of quitting smoking? When you quit smoking, you lower your risk for getting serious diseases and conditions. They can include:  Lung cancer or lung disease.  Heart disease.  Stroke.  Heart attack.  Not being able to have children (infertility).  Weak bones (osteoporosis) and broken bones (fractures).  If you have coughing, wheezing, and shortness of breath, those symptoms may get better when you quit. You may also get sick less often. If you are pregnant, quitting smoking can help to lower your chances of having a baby of low birth weight. What can I do to help me quit smoking? Talk with your doctor about what can help you quit smoking. Some things you can do (strategies) include:  Quitting smoking totally, instead of slowly cutting back how much you smoke over a period of time.  Going to in-person counseling. You are more likely to quit if you go to many counseling sessions.  Using resources and support systems, such as: ? Online chats with a counselor. ? Phone quitlines. ? Printed self-help materials. ? Support groups or group counseling. ? Text messaging programs. ? Mobile phone apps or applications.  Taking medicines. Some of these medicines may have nicotine in them. If you are pregnant or breastfeeding, do not take any medicines to quit smoking unless your doctor says it is okay. Talk with your doctor about counseling or other things that can help you.  Talk with your doctor about using more than one strategy at the same time, such as taking medicines while you are also going to in-person counseling. This can help make  quitting easier. What things can I do to make it easier to quit? Quitting smoking might feel very hard at first, but there is a lot that you can do to make it easier. Take these steps:  Talk to your family and friends. Ask them to support and encourage you.  Call phone quitlines, reach out to support groups, or work with a counselor.  Ask people who smoke to not smoke around you.  Avoid places that make you want (trigger) to smoke, such as: ? Bars. ? Parties. ? Smoke-break areas at work.  Spend time with people who do not smoke.  Lower the stress in your life. Stress can make you want to smoke. Try these things to help your stress: ? Getting regular exercise. ? Deep-breathing exercises. ? Yoga. ? Meditating. ? Doing a body scan. To do this, close your eyes, focus on one area of your body at a time from head to toe, and notice which parts of your body are tense. Try to relax the muscles in those areas.  Download or buy apps on your mobile phone or tablet that can help you stick to your quit plan. There are many free apps, such as QuitGuide from the CDC (Centers for Disease Control and Prevention). You can find more support from smokefree.gov and other websites.  This information is not intended to replace advice given to you by your health care provider. Make sure you discuss any questions you have with your health care provider. Document Released: 05/11/2009 Document   Revised: 03/12/2016 Document Reviewed: 11/29/2014 Elsevier Interactive Patient Education  2018 Elsevier Inc.  

## 2017-02-14 ENCOUNTER — Encounter: Payer: Self-pay | Admitting: Family Medicine

## 2017-02-17 ENCOUNTER — Ambulatory Visit (HOSPITAL_COMMUNITY)
Admission: RE | Admit: 2017-02-17 | Discharge: 2017-02-17 | Disposition: A | Discharge status: Home / Self Care | Payer: 59 | Source: Ambulatory Visit | Attending: Orthopaedic Surgery | Admitting: Orthopaedic Surgery

## 2017-02-17 DIAGNOSIS — M25561 Pain in right knee: Secondary | ICD-10-CM

## 2017-02-17 DIAGNOSIS — G8929 Other chronic pain: Secondary | ICD-10-CM | POA: Insufficient documentation

## 2017-02-17 DIAGNOSIS — S83241A Other tear of medial meniscus, current injury, right knee, initial encounter: Secondary | ICD-10-CM | POA: Diagnosis not present

## 2017-02-17 DIAGNOSIS — X58XXXA Exposure to other specified factors, initial encounter: Secondary | ICD-10-CM | POA: Diagnosis not present

## 2017-02-19 ENCOUNTER — Encounter: Payer: Self-pay | Admitting: Orthopaedic Surgery

## 2017-02-19 ENCOUNTER — Ambulatory Visit (INDEPENDENT_AMBULATORY_CARE_PROVIDER_SITE_OTHER): Payer: 59 | Admitting: Orthopaedic Surgery

## 2017-02-19 VITALS — BP 152/102 | HR 75 | Temp 98.1°F | Ht 71.5 in | Wt 274.0 lb

## 2017-02-19 DIAGNOSIS — M25561 Pain in right knee: Secondary | ICD-10-CM

## 2017-02-19 DIAGNOSIS — S83241A Other tear of medial meniscus, current injury, right knee, initial encounter: Secondary | ICD-10-CM

## 2017-02-19 DIAGNOSIS — F17210 Nicotine dependence, cigarettes, uncomplicated: Secondary | ICD-10-CM

## 2017-02-19 DIAGNOSIS — G8929 Other chronic pain: Secondary | ICD-10-CM

## 2017-02-19 NOTE — Patient Instructions (Signed)
Steps to Quit Smoking Smoking tobacco can be bad for your health. It can also affect almost every organ in your body. Smoking puts you and people around you at risk for many serious long-lasting (chronic) diseases. Quitting smoking is hard, but it is one of the best things that you can do for your health. It is never too late to quit. What are the benefits of quitting smoking? When you quit smoking, you lower your risk for getting serious diseases and conditions. They can include:  Lung cancer or lung disease.  Heart disease.  Stroke.  Heart attack.  Not being able to have children (infertility).  Weak bones (osteoporosis) and broken bones (fractures).  If you have coughing, wheezing, and shortness of breath, those symptoms may get better when you quit. You may also get sick less often. If you are pregnant, quitting smoking can help to lower your chances of having a baby of low birth weight. What can I do to help me quit smoking? Talk with your doctor about what can help you quit smoking. Some things you can do (strategies) include:  Quitting smoking totally, instead of slowly cutting back how much you smoke over a period of time.  Going to in-person counseling. You are more likely to quit if you go to many counseling sessions.  Using resources and support systems, such as: ? Online chats with a counselor. ? Phone quitlines. ? Printed self-help materials. ? Support groups or group counseling. ? Text messaging programs. ? Mobile phone apps or applications.  Taking medicines. Some of these medicines may have nicotine in them. If you are pregnant or breastfeeding, do not take any medicines to quit smoking unless your doctor says it is okay. Talk with your doctor about counseling or other things that can help you.  Talk with your doctor about using more than one strategy at the same time, such as taking medicines while you are also going to in-person counseling. This can help make  quitting easier. What things can I do to make it easier to quit? Quitting smoking might feel very hard at first, but there is a lot that you can do to make it easier. Take these steps:  Talk to your family and friends. Ask them to support and encourage you.  Call phone quitlines, reach out to support groups, or work with a counselor.  Ask people who smoke to not smoke around you.  Avoid places that make you want (trigger) to smoke, such as: ? Bars. ? Parties. ? Smoke-break areas at work.  Spend time with people who do not smoke.  Lower the stress in your life. Stress can make you want to smoke. Try these things to help your stress: ? Getting regular exercise. ? Deep-breathing exercises. ? Yoga. ? Meditating. ? Doing a body scan. To do this, close your eyes, focus on one area of your body at a time from head to toe, and notice which parts of your body are tense. Try to relax the muscles in those areas.  Download or buy apps on your mobile phone or tablet that can help you stick to your quit plan. There are many free apps, such as QuitGuide from the CDC (Centers for Disease Control and Prevention). You can find more support from smokefree.gov and other websites.  This information is not intended to replace advice given to you by your health care provider. Make sure you discuss any questions you have with your health care provider. Document Released: 05/11/2009 Document   Revised: 03/12/2016 Document Reviewed: 11/29/2014 Elsevier Interactive Patient Education  2018 Elsevier Inc.  

## 2017-02-19 NOTE — Progress Notes (Signed)
Patient Jason French, male DOB:November 15, 1960, 56 y.o. [id number]  Chief Complaint  Patient presents with  . Knee Pain    Right    HPI  Jason French is a 56 y.o. male who has right knee pain.  He had a MRI done which showed: IMPRESSION: 1. Complex tear of the posterior horn- body junction of the medial meniscus. 2. Tricompartmental cartilage abnormalities as described above. 3. Intact ACL with severe mucinous degeneration.  I have explained the findings using a model of the knee. I have recommended elective arthroscopy of the knee.  He asked various questions.  I will have Dr. Aline Brochure see him. He is agreeable. HPI  Body mass index is 37.68 kg/m.  ROS  Review of Systems  HENT: Negative for congestion.   Respiratory: Negative for cough and shortness of breath.   Cardiovascular: Negative for chest pain and leg swelling.  Endocrine: Negative for cold intolerance.  Musculoskeletal: Positive for arthralgias, gait problem and joint swelling.  Allergic/Immunologic: Negative for environmental allergies.    Past Medical History:  Diagnosis Date  . Cancer [facility])    adrenal cancer  . Diabetes mellitus without complication ([place])   . Diverticulitis   . Hyperlipidemia   . Hypertension   . Neuropathy   . RLS (restless legs syndrome)     Past Surgical History:  Procedure Laterality Date  . COLON SURGERY    . RESECTION OF ABDOMINAL MASS     adrenal mass right (cancer)    Family History  Problem Relation Age of Onset  . Diverticulitis Mother   . Hypertension Mother   . Diverticulitis Father   . Cancer Father   . Hypertension Father     Social History Social History  Substance Use Topics  . Smoking status: Current Every Day Smoker  . Smokeless tobacco: Never Used  . Alcohol use Yes     Comment: Socail    No Known Allergies  Current Outpatient Prescriptions  Medication Sig Dispense Refill  . atorvastatin (LIPITOR) 10 MG tablet TAKE (1) TABLET BY MOUTH AT  BEDTIME. 30 tablet 0  . benazepril (LOTENSIN) 20 MG tablet Take 40 mg by mouth daily.     . diclofenac (VOLTAREN) 75 MG EC tablet Take 1 tablet (75 mg total) by mouth 2 (two) times daily. 60 tablet 1  . gabapentin (NEURONTIN) 300 MG capsule TAKE 2 CAPSULES BY MOUTH THREE TIMES A DAY. 180 capsule 0  . HYDROcodone-acetaminophen (NORCO/VICODIN) 5-325 MG tablet One tablet every four hours as needed for acute pain.  Limit of five days per [place]. 30 tablet 0  . metFORMIN (GLUCOPHAGE) 1000 MG tablet Take 1,000 mg by mouth 2 (two) times daily with a meal.    . rOPINIRole (REQUIP) 1 MG tablet TAKE (2) TABLETS BY MOUTH AT BEDTIME. 60 tablet 5   No current facility-administered medications for this visit.      Physical Exam  Blood pressure (!) 152/102, pulse 75, temperature 98.1 F (36.7 C), height 5' 11.5" (1.816 m), weight 274 lb (124.3 kg).  Constitutional: overall normal hygiene, normal nutrition, well developed, normal grooming, normal body habitus. Assistive device:none  Musculoskeletal: gait and station Limp right, muscle tone and strength are normal, no tremors or atrophy is present.  .  Neurological: coordination overall normal.  Deep tendon reflex/nerve stretch intact.  Sensation normal.  Cranial nerves II-XII intact.   Skin:   Normal overall no scars, lesions, ulcers or rashes. No psoriasis.  Psychiatric: Alert and oriented x 3.  Recent memory intact, remote memory unclear.  Normal mood and affect. Well groomed.  Good eye contact.  Cardiovascular: overall no swelling, no varicosities, no edema bilaterally, normal temperatures of the legs and arms, no clubbing, cyanosis and good capillary refill.  Lymphatic: palpation is normal.  Right knee has effusion 1+, ROM 0-110, positive medial McMurray, limp right, strength and tone normal.  NV intact.  The patient has been educated about the nature of the problem(s) and counseled on treatment options.  The patient appeared to  understand what I have discussed and is in agreement with it.  Encounter Diagnoses  Name Primary?  . Chronic pain of right knee Yes  . Cigarette nicotine dependence without complication   . Acute tear medial meniscus, right, initial encounter     PLAN Call if any problems.  Precautions discussed.  Continue current medications.   Return to clinic to see Dr. Aline Brochure   Electronically Signed Sanjuana Kava, MD 7/25/20188:35 AM

## 2017-02-21 DIAGNOSIS — G4733 Obstructive sleep apnea (adult) (pediatric): Secondary | ICD-10-CM | POA: Diagnosis not present

## 2017-03-01 ENCOUNTER — Other Ambulatory Visit: Payer: Self-pay | Admitting: Family Medicine

## 2017-03-01 DIAGNOSIS — G629 Polyneuropathy, unspecified: Secondary | ICD-10-CM

## 2017-03-06 ENCOUNTER — Encounter: Payer: Self-pay | Admitting: Family Medicine

## 2017-03-12 ENCOUNTER — Ambulatory Visit (INDEPENDENT_AMBULATORY_CARE_PROVIDER_SITE_OTHER): Payer: 59 | Admitting: Orthopedic Surgery

## 2017-03-12 ENCOUNTER — Encounter: Payer: Self-pay | Admitting: Orthopedic Surgery

## 2017-03-12 VITALS — Ht 71.0 in | Wt 273.0 lb

## 2017-03-12 DIAGNOSIS — D689 Coagulation defect, unspecified: Secondary | ICD-10-CM

## 2017-03-12 DIAGNOSIS — M1711 Unilateral primary osteoarthritis, right knee: Secondary | ICD-10-CM | POA: Diagnosis not present

## 2017-03-12 DIAGNOSIS — S83241D Other tear of medial meniscus, current injury, right knee, subsequent encounter: Secondary | ICD-10-CM

## 2017-03-12 NOTE — Progress Notes (Signed)
Patient ID: Jason French, male   DOB: [birth year], 56 y.o.   MRN: [id number]   Chief Complaint  Patient presents with  . Follow-up    Right knee, no injury, to discuss surgery, referred by Dr. Luna Glasgow.    Jason French is a 56 y.o. male.   HPI 56 year old male presents for evaluation of his right knee. 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 He does remember now slipping on the step of his truck and falling down  In any event he's had a course of nonoperative treatment had chronic pain in his knee is MRI shows he has a meniscal tear. He says he can tolerate it now and needs to wait until he can get some time off from work before he can have the arthroscopic surgery  I explained his findings to him he basically has a medial meniscus tear he has osteoarthritis of the knee.  In the course of his history taking it turns out that he and his daughter both have prolonged bleeding times and he's never had this worked up although he did have surgery to remove a tumor in his abdomen and had a diaphragmatic laceration or cutting to get the tumor out and didn't have any problems with that surgery. He was told afterwards that his bleeding time was prolonged  He will have a hematologist see him prior to our surgery because he has sleep apnea and I'm concerned that if we get into some bleeding putting the tube in he may not be allowed to be intubated. I'm also concerned that if we try to do a spinal has some excessive bleeding we get into trouble from his excessive bleeding time    Review of Systems Review of Systems  Constitutional: Negative.   HENT: Negative.   Eyes: Negative.   Respiratory: Positive for shortness of breath.   Cardiovascular: Negative for chest pain.  Gastrointestinal: Negative.   Genitourinary: Negative.   Musculoskeletal: Negative.   Skin: Negative.   Neurological: Negative.   Endo/Heme/Allergies: Bruises/bleeds easily.   Psychiatric/Behavioral: Negative.     Past Medical History:  Diagnosis Date  . Cancer [facility])    adrenal cancer  . Diabetes mellitus without complication ([place])   . Diverticulitis   . Hyperlipidemia   . Hypertension   . Neuropathy   . RLS (restless legs syndrome)     Past Surgical History:  Procedure Laterality Date  . COLON SURGERY    . RESECTION OF ABDOMINAL MASS     adrenal mass right (cancer)    No Known Allergies  Current Outpatient Prescriptions  Medication Sig Dispense Refill  . atorvastatin (LIPITOR) 10 MG tablet TAKE (1) TABLET BY MOUTH AT BEDTIME. 30 tablet 0  . benazepril (LOTENSIN) 20 MG tablet Take 40 mg by mouth daily.     . diclofenac (VOLTAREN) 75 MG EC tablet Take 1 tablet (75 mg total) by mouth 2 (two) times daily. 60 tablet 1  . gabapentin (NEURONTIN) 300 MG capsule TAKE 2 CAPSULES BY MOUTH THREE TIMES A DAY. 180 capsule 2  . HYDROcodone-acetaminophen (NORCO/VICODIN) 5-325 MG tablet One tablet every four hours as needed for acute pain.  Limit of five days per [place]. 30 tablet 0  . metFORMIN (GLUCOPHAGE) 1000 MG tablet Take 1,000 mg by mouth 2 (two) times daily with a meal.    . rOPINIRole (REQUIP) 1 MG tablet TAKE (2) TABLETS BY MOUTH AT BEDTIME. 60 tablet 5  No current facility-administered medications for this visit.      Physical Exam Ht 5\' 11"  (1.803 m)   Wt 273 lb (123.8 kg)   BMI 38.08 kg/m  Physical Exam   The patient is well developed well nourished and well groomed.   Orientation to person place and time is normal   Mood is pleasant. Affect normal  Ambulatory status is remarkable for NO  limp in the involved extremity   Taken from Dr. Brooke Bonito evaluation: Musculoskeletal: He exhibits tenderness (Pain right knee, effusion 1+, ROM 0 to 105 with medial joint pain, positive medial McMurray and 1/2+ drawer sign, limp to the right, NV intact.  No distal edema.  Left knee negative.).        Motor exam: Grade 5 motor strength in  the quadriceps musculature   Skin: Warm dry and intact over the right leg                       Neuro: normal sensation   Vascular: 2+ DP pulse with normal color and no edema.   Currently the LEFT  lower extremity and knee examination revealed no tenderness or swelling, full range of motion without contracture subluxation atrophy or tremor. Normal muscle tone no instability and the neurovascular status of the limb is normal.    Assessment and Plan:  IMAGING:  Osteoarthritis of the knee  MRI with torn medial meniscus and osteoarthritis   IMPRESSION: 1. Complex tear of the posterior horn- body junction of the medial meniscus. 2. Tricompartmental cartilage abnormalities as described above. 3. Intact ACL with severe mucinous degeneration.     Arthroscopy of the right knee with partial medial meniscectomy  Scheduled for November 14  Encounter Diagnoses  Name Primary?  . Blood clotting disorder ([place]) Yes  . Acute medial meniscus tear, right, subsequent encounter   . Primary osteoarthritis of right knee     This procedure has been fully reviewed with the patient and written informed consent has been obtained.   [name], MD 03/12/2017 5:24 PM

## 2017-03-15 ENCOUNTER — Telehealth: Payer: Self-pay | Admitting: Orthopaedic Surgery

## 2017-03-15 ENCOUNTER — Other Ambulatory Visit: Payer: Self-pay | Admitting: Family Medicine

## 2017-03-20 ENCOUNTER — Other Ambulatory Visit: Payer: Self-pay | Admitting: Family Medicine

## 2017-03-25 ENCOUNTER — Other Ambulatory Visit: Payer: Self-pay | Admitting: Family Medicine

## 2017-04-07 ENCOUNTER — Encounter: Payer: Self-pay | Admitting: Family Medicine

## 2017-04-08 ENCOUNTER — Other Ambulatory Visit: Payer: Self-pay | Admitting: Family Medicine

## 2017-04-11 ENCOUNTER — Other Ambulatory Visit: Payer: Self-pay | Admitting: Family Medicine

## 2017-04-17 ENCOUNTER — Other Ambulatory Visit: Payer: Self-pay | Admitting: Family Medicine

## 2017-04-17 NOTE — Telephone Encounter (Signed)
Medication refilled per protocol. 

## 2017-04-18 ENCOUNTER — Other Ambulatory Visit: Payer: 59

## 2017-04-19 ENCOUNTER — Encounter (HOSPITAL_COMMUNITY): Payer: Self-pay | Admitting: *Deleted

## 2017-04-19 ENCOUNTER — Emergency Department (HOSPITAL_COMMUNITY)
Admission: EM | Admit: 2017-04-19 | Discharge: 2017-04-19 | Disposition: A | Discharge status: Home / Self Care | Payer: 59 | Attending: Emergency Medicine | Admitting: Emergency Medicine

## 2017-04-19 DIAGNOSIS — B353 Tinea pedis: Secondary | ICD-10-CM | POA: Insufficient documentation

## 2017-04-19 DIAGNOSIS — F17200 Nicotine dependence, unspecified, uncomplicated: Secondary | ICD-10-CM | POA: Insufficient documentation

## 2017-04-19 DIAGNOSIS — Z7984 Long term (current) use of oral hypoglycemic drugs: Secondary | ICD-10-CM | POA: Diagnosis not present

## 2017-04-19 DIAGNOSIS — E119 Type 2 diabetes mellitus without complications: Secondary | ICD-10-CM | POA: Insufficient documentation

## 2017-04-19 DIAGNOSIS — Z79899 Other long term (current) drug therapy: Secondary | ICD-10-CM | POA: Insufficient documentation

## 2017-04-19 DIAGNOSIS — C7491 Malignant neoplasm of unspecified part of right adrenal gland: Secondary | ICD-10-CM | POA: Diagnosis not present

## 2017-04-19 DIAGNOSIS — I10 Essential (primary) hypertension: Secondary | ICD-10-CM | POA: Insufficient documentation

## 2017-04-19 DIAGNOSIS — R21 Rash and other nonspecific skin eruption: Secondary | ICD-10-CM | POA: Diagnosis present

## 2017-04-19 LAB — CBC WITH DIFFERENTIAL/PLATELET
BASOS PCT: 0 %
Basophils Absolute: 0.1 10*3/uL (ref 0.0–0.1)
EOS ABS: 0.8 10*3/uL — AB (ref 0.0–0.7)
Eosinophils Relative: 6 %
HEMATOCRIT: 43.6 % (ref 39.0–52.0)
HEMOGLOBIN: 15 g/dL (ref 13.0–17.0)
LYMPHS ABS: 3.9 10*3/uL (ref 0.7–4.0)
Lymphocytes Relative: 33 %
MCH: 31.1 pg (ref 26.0–34.0)
MCHC: 34.4 g/dL (ref 30.0–36.0)
MCV: 90.3 fL (ref 78.0–100.0)
MONOS PCT: 10 %
Monocytes Absolute: 1.1 10*3/uL — ABNORMAL HIGH (ref 0.1–1.0)
NEUTROS ABS: 6 10*3/uL (ref 1.7–7.7)
NEUTROS PCT: 50 %
Platelets: 276 10*3/uL (ref 150–400)
RBC: 4.83 MIL/uL (ref 4.22–5.81)
RDW: 13.5 % (ref 11.5–15.5)
WBC: 11.8 10*3/uL — AB (ref 4.0–10.5)

## 2017-04-19 LAB — COMPREHENSIVE METABOLIC PANEL
ALK PHOS: 74 U/L (ref 38–126)
ALT: 17 U/L (ref 17–63)
AST: 22 U/L (ref 15–41)
Albumin: 3.8 g/dL (ref 3.5–5.0)
Anion gap: 9 (ref 5–15)
BUN: 12 mg/dL (ref 6–20)
CHLORIDE: 105 mmol/L (ref 101–111)
CO2: 26 mmol/L (ref 22–32)
Calcium: 9.1 mg/dL (ref 8.9–10.3)
Creatinine, Ser: 0.81 mg/dL (ref 0.61–1.24)
GFR calc Af Amer: >60 mL/min (ref >60)
GFR calc non Af Amer: >60 mL/min (ref >60)
GLUCOSE: 144 mg/dL — AB (ref 65–99)
Potassium: 3.4 mmol/L — ABNORMAL LOW (ref 3.5–5.1)
SODIUM: 140 mmol/L (ref 135–145)
TOTAL PROTEIN: 6.7 g/dL (ref 6.5–8.1)
Total Bilirubin: 0.7 mg/dL (ref 0.3–1.2)

## 2017-04-19 LAB — CBG MONITORING, ED: GLUCOSE-CAPILLARY: 157 mg/dL — AB (ref 65–99)

## 2017-04-19 MED ORDER — TERBINAFINE HCL 250 MG PO TABS: 250.0000 mg | ORAL_TABLET | Freq: Every day | ORAL | 0 refills | Status: DC

## 2017-04-19 MED ORDER — OXYCODONE-ACETAMINOPHEN 5-325 MG PO TABS
1.0000 | ORAL_TABLET | Freq: Once | ORAL | Status: AC
  Administered 2017-04-19: 1 via ORAL
  Filled 2017-04-19: qty 1

## 2017-04-19 MED ORDER — DOXYCYCLINE HYCLATE 100 MG PO CAPS: 100.0000 mg | ORAL_CAPSULE | Freq: Two times a day (BID) | ORAL | 0 refills | Status: DC

## 2017-04-19 MED ORDER — TERBINAFINE HCL 250 MG PO TABS
250.0000 mg | ORAL_TABLET | Freq: Every day | ORAL | Status: DC
  Administered 2017-04-19: 250 mg via ORAL
  Filled 2017-04-19 (×3): qty 1

## 2017-04-19 MED ORDER — TERBINAFINE HCL 250 MG PO TABS
250.0000 mg | ORAL_TABLET | Freq: Every day | ORAL | Status: DC
  Filled 2017-04-19 (×2): qty 1

## 2017-04-19 NOTE — Discharge Instructions (Signed)
It's important to keep your feet clean and dry. Follow-up with your primary provider next week for recheck. Return to ER for any worsening symptoms.

## 2017-04-19 NOTE — ED Notes (Signed)
TT in to speak w pt and spouse

## 2017-04-19 NOTE — ED Notes (Signed)
Lab drawn  Awaiting results

## 2017-04-19 NOTE — ED Triage Notes (Signed)
C/o swelling and rash to right foot

## 2017-04-19 NOTE — ED Provider Notes (Signed)
[facility] DEPT Provider Note   CSN: [id number] Arrival date & time: 04/19/17  2042     History   Chief Complaint Chief Complaint  Patient presents with  . Foot Pain    HPI Jason French is a 56 y.o. male.  HPI   Jason French is a 56 y.o. male with hx of DM, smoker and neuropathy, who presents to the Emergency Department complaining of pain, swelling and red rash to both feet for 2 weeks. Right worse than left.  Symptoms began after standing in water for extended period of time.  He also complains of mild itching and burning sensation.  He has been applying OTC anti-fungal cream w/o relief.  He denies fever, chills, numbness of the toes, calf pain or swelling and shortness of breath.  Past Medical History:  Diagnosis Date  . Cancer [facility])    adrenal cancer  . Diabetes mellitus without complication ([place])   . Diverticulitis   . Hyperlipidemia   . Hypertension   . Neuropathy   . RLS (restless legs syndrome)     Patient Active Problem List   Diagnosis Date Noted  . HEMATOCHEZIA 03/09/2010  . DIVERTICULITIS, HX OF 03/09/2010  . HYPERLIPIDEMIA 02/28/2010  . HYPERTENSION 02/28/2010    Past Surgical History:  Procedure Laterality Date  . COLON SURGERY    . RESECTION OF ABDOMINAL MASS     adrenal mass right (cancer)       Home Medications    Prior to Admission medications   Medication Sig Start Date End Date Taking? Authorizing Provider  atorvastatin (LIPITOR) 10 MG tablet TAKE (1) TABLET BY MOUTH AT BEDTIME. 04/17/17   Susy Frizzle, MD  benazepril (LOTENSIN) 20 MG tablet TAKE 2 TABLETS DAILY. 04/08/17   Susy Frizzle, MD  diclofenac (VOLTAREN) 75 MG EC tablet TAKE (1) TABLET BY MOUTH TWICE DAILY. 03/25/17   Susy Frizzle, MD  gabapentin (NEURONTIN) 300 MG capsule TAKE 2 CAPSULES BY MOUTH THREE TIMES A DAY. 03/03/17   Susy Frizzle, MD  HYDROcodone-acetaminophen (NORCO/VICODIN) 5-325 MG tablet TAKE (1) TABLET BY MOUTH EVERY 4 HOURS AS NEEDED.  MAX 5 PER DAY. 03/17/17   Sanjuana Kava, MD  metFORMIN (GLUCOPHAGE) 1000 MG tablet TAKE (1) TABLET BY MOUTH TWICE A DAY WITH A MEAL. 04/11/17   Susy Frizzle, MD  rOPINIRole (REQUIP) 1 MG tablet TAKE (2) TABLETS BY MOUTH AT BEDTIME. 04/17/17   Susy Frizzle, MD    Family History Family History  Problem Relation Age of Onset  . Diverticulitis Mother   . Hypertension Mother   . Diverticulitis Father   . Cancer Father   . Hypertension Father     Social History Social History  Substance Use Topics  . Smoking status: Current Every Day Smoker  . Smokeless tobacco: Never Used  . Alcohol use Yes     Comment: Socail     Allergies   Patient has no known allergies.   Review of Systems Review of Systems  Constitutional: Negative for activity change, appetite change, chills and fever.  HENT: Negative for facial swelling, sore throat and trouble swallowing.   Respiratory: Negative for chest tightness, shortness of breath and wheezing.   Cardiovascular: Negative for chest pain.  Gastrointestinal: Negative for nausea and vomiting.  Musculoskeletal: Positive for arthralgias (bilateral foot pain). Negative for neck pain and neck stiffness.  Skin: Positive for color change and rash. Negative for wound.  Neurological: Negative for dizziness, weakness, numbness and headaches.  All  other systems reviewed and are negative.    Physical Exam Updated Vital Signs BP (!) 158/95 (BP Location: Left Arm)   Pulse 98   Temp 97.8 F (36.6 C) (Oral)   Resp 20   Ht 5\' 9"  (1.753 m)   Wt 117.9 kg (260 lb)   SpO2 96%   BMI 38.40 kg/m   Physical Exam  Constitutional: He is oriented to person, place, and time. He appears well-developed and well-nourished. No distress.  HENT:  Head: Normocephalic and atraumatic.  Mouth/Throat: Oropharynx is clear and moist.  Neck: Normal range of motion. Neck supple.  Cardiovascular: Normal rate, regular rhythm, normal heart sounds and intact distal pulses.    No murmur heard. Pulmonary/Chest: Effort normal and breath sounds normal. No respiratory distress.  Musculoskeletal: He exhibits edema and tenderness.  Lymphadenopathy:    He has no cervical adenopathy.  Neurological: He is alert and oriented to person, place, and time. No sensory deficit. He exhibits normal muscle tone. Coordination normal.  Skin: Skin is warm. Capillary refill takes less than 2 seconds. Rash noted. There is erythema.  Confluent erythema of the distal right foot with scattered excoriations and weeping of the web spaces.  No obvious edema of the feet.  Similar sx's to left foot as well, but not as severe.     Nursing note and vitals reviewed.   ED Treatments / Results  Labs (all labs ordered are listed, but only abnormal results are displayed) Labs Reviewed  COMPREHENSIVE METABOLIC PANEL - Abnormal; Notable for the following:       Result Value   Potassium 3.4 (*)    Glucose, Bld 144 (*)    All other components within normal limits  CBC WITH DIFFERENTIAL/PLATELET - Abnormal; Notable for the following:    WBC 11.8 (*)    Monocytes Absolute 1.1 (*)    Eosinophils Absolute 0.8 (*)    All other components within normal limits  CBG MONITORING, ED - Abnormal; Notable for the following:    Glucose-Capillary 157 (*)    All other components within normal limits    EKG  EKG Interpretation None       Radiology No results found.  Procedures Procedures (including critical care time)  Medications Ordered in ED Medications - No data to display   Initial Impression / Assessment and Plan / ED Course  I have reviewed the triage vital signs and the nursing notes.  Pertinent labs & imaging results that were available during my care of the patient were reviewed by me and considered in my medical decision making (see chart for details).     2135  Patient also seen by Dr. Ralene Bathe and care plan discussed.     Sx's of the bilateral feet are c/w tinea pedis with sx's  of right foot > left.  NV intact.  No concerning sx's for DVT.  Also possible early secondary cellulitis.  Labs reassuring.  With prescribe an oral anti-fungal and pt agrees to close f/u with PCP next week for recheck.  Return precautions discussed.  Final Clinical Impressions(s) / ED Diagnoses   Final diagnoses:  Tinea pedis of both feet    New Prescriptions New Prescriptions   No medications on file     Bufford Lope 04/21/17 2254    Quintella Reichert, MD 04/23/17 1219

## 2017-04-19 NOTE — ED Notes (Signed)
Called a total of 3 times and no answer

## 2017-04-19 NOTE — ED Notes (Signed)
TT in to assess 

## 2017-04-24 ENCOUNTER — Ambulatory Visit (HOSPITAL_COMMUNITY): Payer: 59

## 2017-04-24 ENCOUNTER — Telehealth: Payer: Self-pay | Admitting: *Deleted

## 2017-04-24 NOTE — Telephone Encounter (Signed)
Patient request refill on Diclofenac 75 mg and Hydrocodone 5-325 mg. The diclofenac can go to [facility].

## 2017-04-25 ENCOUNTER — Other Ambulatory Visit: Payer: Self-pay | Admitting: Orthopedic Surgery

## 2017-04-25 MED ORDER — HYDROCODONE-ACETAMINOPHEN 5-325 MG PO TABS: 1.0000 | ORAL_TABLET | Freq: Four times a day (QID) | ORAL | 0 refills | Status: DC | PRN

## 2017-04-25 MED ORDER — DICLOFENAC SODIUM 75 MG PO TBEC: 75.0000 mg | DELAYED_RELEASE_TABLET | Freq: Two times a day (BID) | ORAL | 0 refills | Status: DC

## 2017-05-05 ENCOUNTER — Encounter (HOSPITAL_COMMUNITY): Payer: 59

## 2017-05-05 ENCOUNTER — Encounter (HOSPITAL_COMMUNITY): Payer: 59 | Attending: Family Medicine | Admitting: Oncology

## 2017-05-05 ENCOUNTER — Encounter (HOSPITAL_COMMUNITY): Payer: Self-pay

## 2017-05-05 ENCOUNTER — Other Ambulatory Visit: Payer: 59

## 2017-05-05 ENCOUNTER — Encounter: Payer: Self-pay | Admitting: Family Medicine

## 2017-05-05 ENCOUNTER — Ambulatory Visit (INDEPENDENT_AMBULATORY_CARE_PROVIDER_SITE_OTHER): Payer: 59 | Admitting: Family Medicine

## 2017-05-05 ENCOUNTER — Other Ambulatory Visit (HOSPITAL_COMMUNITY): Payer: Self-pay | Admitting: Oncology

## 2017-05-05 VITALS — BP 150/90 | HR 102 | Temp 98.3°F | Resp 18 | Ht 69.0 in | Wt 280.0 lb

## 2017-05-05 DIAGNOSIS — E1140 Type 2 diabetes mellitus with diabetic neuropathy, unspecified: Secondary | ICD-10-CM | POA: Insufficient documentation

## 2017-05-05 DIAGNOSIS — I10 Essential (primary) hypertension: Secondary | ICD-10-CM

## 2017-05-05 DIAGNOSIS — R791 Abnormal coagulation profile: Secondary | ICD-10-CM

## 2017-05-05 DIAGNOSIS — G2581 Restless legs syndrome: Secondary | ICD-10-CM | POA: Diagnosis not present

## 2017-05-05 DIAGNOSIS — Z7984 Long term (current) use of oral hypoglycemic drugs: Secondary | ICD-10-CM | POA: Diagnosis not present

## 2017-05-05 DIAGNOSIS — E7800 Pure hypercholesterolemia, unspecified: Secondary | ICD-10-CM | POA: Diagnosis not present

## 2017-05-05 DIAGNOSIS — E785 Hyperlipidemia, unspecified: Secondary | ICD-10-CM | POA: Diagnosis not present

## 2017-05-05 DIAGNOSIS — E119 Type 2 diabetes mellitus without complications: Secondary | ICD-10-CM

## 2017-05-05 DIAGNOSIS — Z85858 Personal history of malignant neoplasm of other endocrine glands: Secondary | ICD-10-CM | POA: Diagnosis not present

## 2017-05-05 DIAGNOSIS — B353 Tinea pedis: Secondary | ICD-10-CM

## 2017-05-05 DIAGNOSIS — Z79899 Other long term (current) drug therapy: Secondary | ICD-10-CM | POA: Diagnosis not present

## 2017-05-05 LAB — COMPREHENSIVE METABOLIC PANEL
ALT: 19 U/L (ref 17–63)
ANION GAP: 9 (ref 5–15)
AST: 23 U/L (ref 15–41)
Albumin: 3.8 g/dL (ref 3.5–5.0)
Alkaline Phosphatase: 68 U/L (ref 38–126)
BILIRUBIN TOTAL: 0.8 mg/dL (ref 0.3–1.2)
BUN: 11 mg/dL (ref 6–20)
CHLORIDE: 101 mmol/L (ref 101–111)
CO2: 25 mmol/L (ref 22–32)
Calcium: 8.9 mg/dL (ref 8.9–10.3)
Creatinine, Ser: 0.76 mg/dL (ref 0.61–1.24)
GFR calc Af Amer: >60 mL/min (ref >60)
Glucose, Bld: 102 mg/dL — ABNORMAL HIGH (ref 65–99)
POTASSIUM: 4 mmol/L (ref 3.5–5.1)
Sodium: 135 mmol/L (ref 135–145)
TOTAL PROTEIN: 6.7 g/dL (ref 6.5–8.1)

## 2017-05-05 LAB — CBC WITH DIFFERENTIAL/PLATELET
BASOS ABS: 0 10*3/uL (ref 0.0–0.1)
Basophils Relative: 0 %
Eosinophils Absolute: 0.7 10*3/uL (ref 0.0–0.7)
Eosinophils Relative: 7 %
HEMATOCRIT: 44.4 % (ref 39.0–52.0)
HEMOGLOBIN: 15 g/dL (ref 13.0–17.0)
LYMPHS PCT: 28 %
Lymphs Abs: 3 10*3/uL (ref 0.7–4.0)
MCH: 30.8 pg (ref 26.0–34.0)
MCHC: 33.8 g/dL (ref 30.0–36.0)
MCV: 91.2 fL (ref 78.0–100.0)
Monocytes Absolute: 0.9 10*3/uL (ref 0.1–1.0)
Monocytes Relative: 8 %
NEUTROS ABS: 6.3 10*3/uL (ref 1.7–7.7)
NEUTROS PCT: 57 %
PLATELETS: 258 10*3/uL (ref 150–400)
RBC: 4.87 MIL/uL (ref 4.22–5.81)
RDW: 13.5 % (ref 11.5–15.5)
WBC: 10.9 10*3/uL — AB (ref 4.0–10.5)

## 2017-05-05 LAB — FIBRINOGEN: Fibrinogen: 410 mg/dL (ref 210–475)

## 2017-05-05 LAB — PROTIME-INR
INR: 1.24
PROTHROMBIN TIME: 15.5 s — AB (ref 11.4–15.2)

## 2017-05-05 LAB — APTT: APTT: 27 s (ref 24–36)

## 2017-05-05 MED ORDER — CLOTRIMAZOLE 1 % EX CREA: 1.0000 "application " | TOPICAL_CREAM | Freq: Two times a day (BID) | CUTANEOUS | 0 refills | Status: DC

## 2017-05-05 MED ORDER — HYDROCHLOROTHIAZIDE 25 MG PO TABS: 25.0000 mg | ORAL_TABLET | Freq: Every day | ORAL | 3 refills | Status: DC

## 2017-05-05 MED ORDER — FLUCONAZOLE 150 MG PO TABS: 150.0000 mg | ORAL_TABLET | ORAL | 0 refills | Status: DC

## 2017-05-05 NOTE — Progress Notes (Signed)
[facility] Initial Visit:  Patient Care Team: Susy Frizzle, MD as PCP - General (Family Medicine)  CHIEF COMPLAINTS/PURPOSE OF CONSULTATION:  Abnormal bleeding time  HISTORY OF PRESENTING ILLNESS: Jason French 56 y.o. male is here because of abnormal bleeding time. Patient initially went to see orthopedics Dr. Aline Brochure on 03/12/2017 for a right knee meniscal tear. 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 He is here for preop evaluation since Dr. Aline Brochure is concerned that patient may have excessive bleeding during his surgery. He is currently scheduled for an arthroscopy of the right knee with partial medial meniscectomy on 06/11/17. Patient has never seen a hematologist for workup in the past. He denies any chronic problems with prolonged bleeding or bruising. He complains of leg swelling today but otherwise has no complaints. He denies any chest pain, shortness of breath,abdominal pain, focal weakness.  Review of Systems - Oncology ROS as per HPI otherwise 12 point ROS is negative.   MEDICAL HISTORY: Past Medical History:  Diagnosis Date  . Cancer [facility])    adrenal cancer  . Diabetes mellitus without complication ([place])   . Diverticulitis   . Hyperlipidemia   . Hypertension   . Neuropathy   . RLS (restless legs syndrome)     SURGICAL HISTORY: Past Surgical History:  Procedure Laterality Date  . COLON SURGERY    . RESECTION OF ABDOMINAL MASS     adrenal mass right (cancer)    SOCIAL HISTORY: Social History   Social History  . Marital status: Married    Spouse name: N/A  . Number of children: N/A  . Years of education: N/A   Occupational History  . Not  on file.   Social History Main Topics  . Smoking status: Current Every Day Smoker  . Smokeless tobacco: Never Used  . Alcohol use Yes     Comment: Socail  . Drug use: No  . Sexual activity: Yes   Other Topics Concern  . Not on file   Social History Narrative  . No narrative on file    FAMILY HISTORY Family History  Problem Relation Age of Onset  . Diverticulitis Mother   . Hypertension Mother   . Diverticulitis Father   . Cancer Father   . Hypertension Father     ALLERGIES:  has No Known Allergies.  MEDICATIONS:  Current Outpatient Prescriptions  Medication Sig Dispense Refill  . atorvastatin (LIPITOR) 10 MG tablet TAKE (1) TABLET BY MOUTH AT BEDTIME. 30 tablet 2  . benazepril (LOTENSIN) 20 MG tablet TAKE 2 TABLETS DAILY. 60 tablet 0  . diclofenac (VOLTAREN) 75 MG EC tablet Take 1 tablet (75 mg total) by mouth 2 (two) times daily. 60 tablet 0  . doxycycline (VIBRAMYCIN) 100 MG capsule Take 1 capsule (100 mg total) by mouth 2 (two) times daily. 20 capsule 0  . gabapentin (NEURONTIN) 300 MG capsule TAKE 2 CAPSULES BY MOUTH THREE TIMES A DAY. 180 capsule 2  . HYDROcodone-acetaminophen (NORCO/VICODIN) 5-325 MG tablet Take 1 tablet by mouth every 6 (six) hours as needed for moderate pain. 30 tablet 0  . metFORMIN (GLUCOPHAGE) 1000 MG tablet TAKE (1) TABLET BY MOUTH TWICE A DAY WITH A MEAL. 60 tablet  0  . rOPINIRole (REQUIP) 1 MG tablet TAKE (2) TABLETS BY MOUTH AT BEDTIME. 60 tablet 2  . terbinafine (LAMISIL) 250 MG tablet Take 1 tablet (250 mg total) by mouth daily. 14 tablet 0   No current facility-administered medications for this visit.     PHYSICAL EXAMINATION: Vitals:   05/05/17 0852  BP: (!) 172/106  Pulse: 83  Resp: 20  SpO2: 97%    Filed Weights   05/05/17 0852  Weight: 277 lb 6.4 oz (125.8 kg)     Physical Exam Constitutional: Well-developed, well-nourished, and in no distress.   HENT:  Head: Normocephalic and atraumatic.  Mouth/Throat: No  oropharyngeal exudate. Mucosa moist. Eyes: Pupils are equal, round, and reactive to light. Conjunctivae are normal. No scleral icterus.  Neck: Normal range of motion. Neck supple. No JVD present.  Cardiovascular: Normal rate, regular rhythm and normal heart sounds.  Exam reveals no gallop and no friction rub.   No murmur heard. Pulmonary/Chest: Effort normal and breath sounds normal. No respiratory distress. No wheezes.No rales.  Abdominal: Soft. Bowel sounds are normal. No distension. There is no tenderness. There is no guarding.  Musculoskeletal: No edema or tenderness.  Lymphadenopathy:    No cervical or supraclavicular adenopathy.  Neurological: Alert and oriented to person, place, and time. No cranial nerve deficit.  Skin: Skin is warm and dry. No rash noted. No erythema. No pallor.  Psychiatric: Affect and judgment normal.   LABORATORY DATA: I have personally reviewed the data as listed:  Admission on 04/19/2017, Discharged on 04/19/2017  Component Date Value Ref Range Status  . Glucose-Capillary 04/19/2017 157* 65 - 99 mg/dL Final  . Sodium 04/19/2017 140  135 - 145 mmol/L Final  . Potassium 04/19/2017 3.4* 3.5 - 5.1 mmol/L Final  . Chloride 04/19/2017 105  101 - 111 mmol/L Final  . CO2 04/19/2017 26  22 - 32 mmol/L Final  . Glucose, Bld 04/19/2017 144* 65 - 99 mg/dL Final  . BUN 04/19/2017 12  6 - 20 mg/dL Final  . Creatinine, Ser 04/19/2017 0.81  0.61 - 1.24 mg/dL Final  . Calcium 04/19/2017 9.1  8.9 - 10.3 mg/dL Final  . Total Protein 04/19/2017 6.7  6.5 - 8.1 g/dL Final  . Albumin 04/19/2017 3.8  3.5 - 5.0 g/dL Final  . AST 04/19/2017 22  15 - 41 U/L Final  . ALT 04/19/2017 17  17 - 63 U/L Final  . Alkaline Phosphatase 04/19/2017 74  38 - 126 U/L Final  . Total Bilirubin 04/19/2017 0.7  0.3 - 1.2 mg/dL Final  . GFR calc non Af Amer 04/19/2017 >60  >60 mL/min Final  . GFR calc Af Amer 04/19/2017 >60  >60 mL/min Final   Comment: (NOTE) The eGFR has been calculated using  the CKD EPI equation. This calculation has not been validated in all clinical situations. eGFR's persistently <60 mL/min signify possible Chronic Kidney Disease.   . Anion gap 04/19/2017 9  5 - 15 Final  . WBC 04/19/2017 11.8* 4.0 - 10.5 K/uL Final  . RBC 04/19/2017 4.83  4.22 - 5.81 MIL/uL Final  . Hemoglobin 04/19/2017 15.0  13.0 - 17.0 g/dL Final  . HCT 04/19/2017 43.6  39.0 - 52.0 % Final  . MCV 04/19/2017 90.3  78.0 - 100.0 fL Final  . MCH 04/19/2017 31.1  26.0 - 34.0 pg Final  . MCHC 04/19/2017 34.4  30.0 - 36.0 g/dL Final  . RDW 04/19/2017 13.5  11.5 - 15.5 % Final  . Platelets  04/19/2017 276  150 - 400 K/uL Final  . Neutrophils Relative % 04/19/2017 50  % Final  . Neutro Abs 04/19/2017 6.0  1.7 - 7.7 K/uL Final  . Lymphocytes Relative 04/19/2017 33  % Final  . Lymphs Abs 04/19/2017 3.9  0.7 - 4.0 K/uL Final  . Monocytes Relative 04/19/2017 10  % Final  . Monocytes Absolute 04/19/2017 1.1* 0.1 - 1.0 K/uL Final  . Eosinophils Relative 04/19/2017 6  % Final  . Eosinophils Absolute 04/19/2017 0.8* 0.0 - 0.7 K/uL Final  . Basophils Relative 04/19/2017 0  % Final  . Basophils Absolute 04/19/2017 0.1  0.0 - 0.1 K/uL Final    RADIOGRAPHIC STUDIES: I have personally reviewed the radiological images as listed and agree with the findings in the report  No results found.  ASSESSMENT/PLAN 56 year old male presents for pre-op clearance for history of abnormal bleeding time.   PLAN: -Unclear what test patient had in the past that indicated that he had coagulation issues. I do not have any labs available to me except for a CBC from 04/19/17 which indicated that he had normal platelets. -A normal BT does not predict the safety of surgical procedures, nor does an abnormal BT predict for excessive bleeding. Since assessment of the BT is subject to considerable variation due to technical factors in executing the test, a normal range for the test varies from laboratory to laboratory, and  cannot be generalized here. Of importance, the BT is not recommended as a preoperative screening test. -I will check the below tests to assess for his coagulation. RTC in 2 weeks to review lab results and the next plan of care. If either his PTT or PT is abnormal then I will order mixing studies for further evaluation.  Orders Placed This Encounter  Procedures  . APTT    Standing Status:   Future    Number of Occurrences:   1    Standing Expiration Date:   05/05/2018  . Protime-INR    Standing Status:   Future    Number of Occurrences:   1    Standing Expiration Date:   05/05/2018  . Comprehensive metabolic panel    Standing Status:   Future    Number of Occurrences:   1    Standing Expiration Date:   05/05/2018  . CBC with Differential    Standing Status:   Future    Number of Occurrences:   1    Standing Expiration Date:   05/05/2018  . Von Willebrand panel  . Fibrinogen    All questions were answered. The patient knows to call the clinic with any problems, questions or concerns.  This note was electronically signed.    Twana First, MD  05/05/2017 8:44 AM

## 2017-05-06 LAB — VON WILLEBRAND PANEL
COAGULATION FACTOR VIII: 93 % (ref 57–163)
RISTOCETIN CO-FACTOR, PLASMA: 47 % — AB (ref 50–200)
Von Willebrand Antigen, Plasma: 68 % (ref 50–200)

## 2017-05-06 LAB — HEMOGLOBIN A1C
HEMOGLOBIN A1C: 6.1 %{Hb} — AB (ref <5.7)
MEAN PLASMA GLUCOSE: 128 (calc)
eAG (mmol/L): 7.1 (calc)

## 2017-05-06 LAB — LIPID PANEL
CHOL/HDL RATIO: 3.8 (calc) (ref <5.0)
Cholesterol: 117 mg/dL (ref <200)
HDL: 31 mg/dL — ABNORMAL LOW (ref >40)
LDL CHOLESTEROL (CALC): 59 mg/dL
NON-HDL CHOLESTEROL (CALC): 86 mg/dL (ref <130)
TRIGLYCERIDES: 196 mg/dL — AB (ref <150)

## 2017-05-06 LAB — COAG STUDIES INTERP REPORT

## 2017-05-06 NOTE — Progress Notes (Signed)
Subjective:    Patient ID: Jason French, male    DOB: 1960/12/05, 56 y.o.   MRN: [id number]  Medication Refill     04/22/16 Patient is here today to establish care. 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 The mass was removed and he was followed for 10 years with periodic CT scans and MRIs. He states that this is resolved. However I have no old records to review. He is also diabetic who takes metformin. He denies any hypoglycemic episodes. He denies any polyuria, polydipsia, or blurred vision. His blood pressure is elevated today but he states this usually less than 140/90 at home. He denies any chest pain shortness of breath or dyspnea on exertion. He is also on Lipitor for hyperlipidemia. He denies any myalgias or right upper quadrant pain. He does have a large surgical scar in the right upper quadrant where the adrenal mass was removed. Past medical history significant for diverticulitis status post rupture and abscess formation. This required operative drainage and also the patient states that a portion of his colon was removed. Last colonoscopy was less than 10 years ago. He is due for PSA. He refuses a flu shot. He refuses pneumonia vaccine.  At that time, my plan was: Blood pressure is elevated today. Patient is confident is better at home. He does report feeling nervous today. He will check his blood pressure frequently at home and notify me of the values. Return fasting for a CBC, CMP, fasting lipid panel, and a PSA. I will also check a hemoglobin A1c as well as a urine microalbumin. If A1c is acceptable, consider switching metformin to the toes of for weight loss. Patient's colonoscopy is up-to-date. He refuses a flu shot as well as the pneumonia shot. I will check a PSA to screen for prostate cancer. 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  Patient has restless leg syndrome and symptoms are not well controlled. He also complains of some numbness and tingling in his feet as well as some neuropathic pain in his feet at night. Therefore I'll add gabapentin initially 300 mg by mouth daily at bedtime. Patient may take more often if necessary for nerve pain  08/09/16 Has not seen any benefit in managing his neuropathy since starting gabapentin. He continues to have pins and needles pain and tingling in his feet on a daily basis. He is interested in trying to increase the dose. He has been taking Vick toes of as we discussed at his last visit. His hemoglobin A1c was outstanding at 6.1 and therefore we discontinued metformin and replaced with Victoza. Unfortunately he has actually gained weight since switching to Vick toes a. Furthermore he is having hypoglycemic episodes. He is interesting in trying another alternative. His blood pressure is elevated today but he swears this is due to whitecoat syndrome. I need to correct his medication list listed below. He is also taking benazepril 40 mg a day not 20 mg a day. He denies any chest pain shortness of breath or dyspnea on exertion.  AT that time, my plan was:  Check blood pressure on a daily basis and call me with the values in one week. If consistently  greater than 140/90, I will add hydrochlorothiazide. I will recheck a hemoglobin A1c. However I'm going to discontinue Victoza and replace with Jardiance 25 mg a day. Furthermore he is no longer on metformin. Recheck hemoglobin A1c in 6 months. Work on diet neck size and weight loss. Increase gabapentin gradually to 600 mg by mouth 3 times a day for neuropathy  05/06/17 Patient was recently in the emergency room and was diagnosed with tinea pedis on both feet and was given Lamisil pills in addition to doxycycline for secondary cellulitis. He  states that his feet are better though by no means are back to normal. I examined both feet. Toes are pink with scale on the dorsums and in the intertriginous  spaces between the toes.  In several areas, the skin has cracked open and is weeping. There is crusting secondary to the weeping. There is also similar plaques on the plantar surfaces of both midfeet.  He has completed a full course of Lamisil and although improvement was seen, there has been no means resolution. His blood pressure was also extremely high in the emergency room as well as in our office. His systolic blood pressure has been averaging between 150 and 170. His diastolic blood pressure has been averaging between 90 and 100. He is overdue for hemoglobin A1c. He is also overdue for fasting lab work. He is planning to have knee surgery and has recently seen hematology due to an elevated prothrombin time. They are working up the patient for increased risk of bleeding. He is also requesting removal of skin tags however our time did not allow discussion of all of these issues. Past Medical History:  Diagnosis Date  . Cancer [facility])    adrenal cancer  . Diabetes mellitus without complication ([place])   . Diverticulitis   . Hyperlipidemia   . Hypertension   . Neuropathy   . RLS (restless legs syndrome)    Past Surgical History:  Procedure Laterality Date  . COLON SURGERY    . RESECTION OF ABDOMINAL MASS     adrenal mass right (cancer)   Current Outpatient Prescriptions on File Prior to Visit  Medication Sig Dispense Refill  . atorvastatin (LIPITOR) 10 MG tablet TAKE (1) TABLET BY MOUTH AT BEDTIME. 30 tablet 2  . benazepril (LOTENSIN) 20 MG tablet TAKE 2 TABLETS DAILY. 60 tablet 0  . diclofenac (VOLTAREN) 75 MG EC tablet Take 1 tablet (75 mg total) by mouth 2 (two) times daily. 60 tablet 0  . gabapentin (NEURONTIN) 300 MG capsule TAKE 2 CAPSULES BY MOUTH THREE TIMES A DAY. 180 capsule 2  . HYDROcodone-acetaminophen (NORCO/VICODIN) 5-325  MG tablet Take 1 tablet by mouth every 6 (six) hours as needed for moderate pain. 30 tablet 0  . metFORMIN (GLUCOPHAGE) 1000 MG tablet TAKE (1) TABLET BY MOUTH TWICE A DAY WITH A MEAL. 60 tablet 0  . rOPINIRole (REQUIP) 1 MG tablet TAKE (2) TABLETS BY MOUTH AT BEDTIME. 60 tablet 2   No current facility-administered medications on file prior to visit.    No Known Allergies Social History   Social History  . Marital status: Married    Spouse name: N/A  . Number of children: N/A  . Years of education: N/A   Occupational History  . Not on file.   Social History Main Topics  . Smoking status: Current Every Day Smoker  . Smokeless tobacco: Never Used  . Alcohol use Yes     Comment: Socail  . Drug use: No  .  Sexual activity: Yes   Other Topics Concern  . Not on file   Social History Narrative  . No narrative on file      Review of Systems  All other systems reviewed and are negative.      Objective:   Physical Exam  Constitutional: He appears well-developed and well-nourished. No distress.  Eyes: Pupils are equal, round, and reactive to light. Conjunctivae and EOM are normal. Right eye exhibits no discharge. Left eye exhibits no discharge. No scleral icterus.  Neck: Normal range of motion. Neck supple. No JVD present. No thyromegaly present.  Cardiovascular: Normal rate, regular rhythm, normal heart sounds and intact distal pulses.  Exam reveals no gallop and no friction rub.   No murmur heard. Pulmonary/Chest: Effort normal and breath sounds normal. No stridor. No respiratory distress. He has no wheezes. He has no rales. He exhibits no tenderness.  Abdominal: Soft. Bowel sounds are normal.  Musculoskeletal: Normal range of motion.  Skin: Skin is warm. Rash noted. He is not diaphoretic. There is erythema.  Vitals reviewed.         Assessment & Plan:  Tinea pedis of both feet - Plan: clotrimazole (LOTRIMIN AF) 1 % cream, fluconazole (DIFLUCAN) 150 MG  tablet  Controlled type 2 diabetes mellitus without complication, without long-term current use of insulin (HCC)  Pure hypercholesterolemia  Benign essential HTN  I will start the patient on Diflucan 150 mg by mouth every other day for 2 weeks. Meanwhile I want the patient to use Lotrimin 1% cream twice daily as a barrier cream, Imodium, and an antifungal medication as well to treat the severe case of athlete's foot.  His blood pressure is out of control. I have asked him to add hydrochlorothiazide 25 mg a day and recheck blood pressure in one month. I will check a CMP along with a hemoglobin A1c to assess the control of his diabetes prior to surgery. His goal hemoglobin A1c is less than 6.5. I will defer removing the skin tags in his right axilla until his follow-up office visit to ensure that the patient returns so that I can make sure his blood pressure is well controlled prior to surgery

## 2017-05-07 LAB — PT FACTOR INHIBITOR (MIXING STUDY)
1 HR INCUB PT 1:1NP: 11.2 s (ref 9.6–11.5)
PT 11NP: 11.4 s (ref 9.6–11.5)
PT: 13.3 s — AB (ref 9.6–11.5)

## 2017-05-10 ENCOUNTER — Other Ambulatory Visit: Payer: Self-pay | Admitting: Family Medicine

## 2017-05-10 DIAGNOSIS — G629 Polyneuropathy, unspecified: Secondary | ICD-10-CM

## 2017-05-16 ENCOUNTER — Other Ambulatory Visit: Payer: Self-pay | Admitting: Family Medicine

## 2017-05-16 NOTE — Telephone Encounter (Signed)
Medication refilled per protocol. 

## 2017-05-20 ENCOUNTER — Encounter (HOSPITAL_COMMUNITY): Payer: Self-pay

## 2017-05-20 ENCOUNTER — Encounter (HOSPITAL_BASED_OUTPATIENT_CLINIC_OR_DEPARTMENT_OTHER): Payer: 59 | Admitting: Oncology

## 2017-05-20 ENCOUNTER — Encounter (HOSPITAL_COMMUNITY): Payer: 59

## 2017-05-20 DIAGNOSIS — R791 Abnormal coagulation profile: Secondary | ICD-10-CM | POA: Diagnosis not present

## 2017-05-20 LAB — PROTIME-INR
INR: 1.18
PROTHROMBIN TIME: 14.9 s (ref 11.4–15.2)

## 2017-05-20 NOTE — Progress Notes (Signed)
Please call the patient and let him know that I had repeated his PT INR today and it has returned to normal ranges. Let him know I have sent a note to Dr. Aline Brochure his surgeon, stating that he is cleared for knee surgery.

## 2017-05-20 NOTE — Progress Notes (Signed)
[facility] Initial Visit:  Patient Care Team: Susy Frizzle, MD as PCP - General (Family Medicine)  CHIEF COMPLAINTS/PURPOSE OF CONSULTATION:  Abnormal bleeding time  HISTORY OF PRESENTING ILLNESS: Jason French 56 y.o. male is here because of abnormal bleeding time. Patient initially went to see orthopedics Dr. Aline Brochure on 03/12/2017 for a right knee meniscal tear. 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 He is here for preop evaluation since Dr. Aline Brochure is concerned that patient may have excessive bleeding during his surgery. He is currently scheduled for an arthroscopy of the right knee with partial medial meniscectomy on 06/11/17. Patient has never seen a hematologist for workup in the past. He denies any chronic problems with prolonged bleeding or bruising. He complains of leg swelling today but otherwise has no complaints. He denies any chest pain, shortness of breath,abdominal pain, focal weakness.   INTERVAL HISTORY:  Patient presents today for continue follow-up.  Lab work performed on 05/05/2017 demonstrated PT 15.5, INR 1.24, PTT 27.  Fibrinogen was 410.  Von Willebrand's panel was negative for von Willebrand's disease.  A PT mixing study demonstrated correction of PT with mixing.  Today he has no new complaints. He denies any chest pain, shortness of breath,abdominal pain, focal weakness.  Review of Systems - Oncology ROS as per HPI otherwise 12 point ROS is negative.   MEDICAL HISTORY: Past Medical History:  Diagnosis Date  . Cancer [facility]    adrenal cancer  . Diabetes mellitus without complication ([place])   . Diverticulitis   . Hyperlipidemia   . Hypertension    . Neuropathy   . RLS (restless legs syndrome)     SURGICAL HISTORY: Past Surgical History:  Procedure Laterality Date  . COLON SURGERY    . RESECTION OF ABDOMINAL MASS     adrenal mass right (cancer)    SOCIAL HISTORY: Social History   Social History  . Marital status: Married    Spouse name: N/A  . Number of children: N/A  . Years of education: N/A   Occupational History  . Not on file.   Social History Main Topics  . Smoking status: Current Every Day Smoker  . Smokeless tobacco: Never Used  . Alcohol use Yes     Comment: Socail  . Drug use: No  . Sexual activity: Yes   Other Topics Concern  . Not on file   Social History Narrative  . No narrative on file    FAMILY HISTORY Family History  Problem Relation Age of Onset  . Diverticulitis Mother   . Hypertension Mother   . Diverticulitis Father   . Cancer Father   . Hypertension Father     ALLERGIES:  has No Known Allergies.  MEDICATIONS:  Current Outpatient Prescriptions  Medication Sig Dispense Refill  . atorvastatin (LIPITOR) 10 MG tablet TAKE (1) TABLET BY MOUTH AT BEDTIME. 30 tablet 2  . benazepril (LOTENSIN) 20 MG tablet TAKE 2 TABLETS DAILY. 60 tablet 0  . clotrimazole (LOTRIMIN AF) 1 % cream Apply 1 application topically 2 (two) times daily. 30 g 0  . diclofenac (VOLTAREN) 75 MG EC tablet Take 1 tablet (75 mg total) by mouth 2 (two) times daily. 60 tablet  0  . fluconazole (DIFLUCAN) 150 MG tablet Take 1 tablet (150 mg total) by mouth every other day. 7 tablet 0  . gabapentin (NEURONTIN) 300 MG capsule TAKE 2 CAPSULES BY MOUTH THREE TIMES A DAY. 180 capsule 0  . hydrochlorothiazide (HYDRODIURIL) 25 MG tablet Take 1 tablet (25 mg total) by mouth daily. 90 tablet 3  . HYDROcodone-acetaminophen (NORCO/VICODIN) 5-325 MG tablet Take 1 tablet by mouth every 6 (six) hours as needed for moderate pain. 30 tablet 0  . metFORMIN (GLUCOPHAGE) 1000 MG tablet TAKE (1) TABLET BY MOUTH TWICE A DAY WITH A MEAL. 180  tablet 1  . rOPINIRole (REQUIP) 1 MG tablet TAKE (2) TABLETS BY MOUTH AT BEDTIME. 60 tablet 2   No current facility-administered medications for this visit.     PHYSICAL EXAMINATION: Vitals:   05/20/17 1518  BP: 119/68  Pulse: (!) 110  Resp: 16  SpO2: 97%    Filed Weights   05/20/17 1518  Weight: 280 lb (127 kg)     Physical Exam Constitutional: Well-developed, well-nourished, and in no distress.   HENT:  Head: Normocephalic and atraumatic.  Mouth/Throat: No oropharyngeal exudate. Mucosa moist. Eyes: Pupils are equal, round, and reactive to light. Conjunctivae are normal. No scleral icterus.  Neck: Normal range of motion. Neck supple. No JVD present.  Cardiovascular: Normal rate, regular rhythm and normal heart sounds.  Exam reveals no gallop and no friction rub.   No murmur heard. Pulmonary/Chest: Effort normal and breath sounds normal. No respiratory distress. No wheezes.No rales.  Abdominal: Soft. Bowel sounds are normal. No distension. There is no tenderness. There is no guarding.  Musculoskeletal: No edema or tenderness.  Lymphadenopathy:    No cervical or supraclavicular adenopathy.  Neurological: Alert and oriented to person, place, and time. No cranial nerve deficit.  Skin: Skin is warm and dry. No rash noted. No erythema. No pallor.  Psychiatric: Affect and judgment normal.   LABORATORY DATA: I have personally reviewed the data as listed:  Orders Only on 05/05/2017  Component Date Value Ref Range Status  . PT 05/05/2017 13.3* 9.6 - 11.5 sec Final  . PT 1:1NP 05/05/2017 11.4  9.6 - 11.5 sec Final  . 1 HR INCUB PT 1:1NP 05/05/2017 11.2  9.6 - 11.5 sec Final   Comment: (NOTE) Performed At: [facility] [place], [place] [id number] Lindon Romp MD [id number]   Appointment on 05/05/2017  Component Date Value Ref Range Status  . Hgb A1c MFr Bld 05/05/2017 6.1* <5.7 % of total Hgb Final   Comment: For someone without known  diabetes, a hemoglobin  A1c value between 5.7% and 6.4% is consistent with prediabetes and should be confirmed with a  follow-up test. . For someone with known diabetes, a value <7% indicates that their diabetes is well controlled. A1c targets should be individualized based on duration of diabetes, age, comorbid conditions, and other considerations. . This assay result is consistent with an increased risk of diabetes. . Currently, no consensus exists regarding use of hemoglobin A1c for diagnosis of diabetes for children. .   . Mean Plasma Glucose 05/05/2017 128  (calc) Final  . eAG (mmol/L) 05/05/2017 7.1  (calc) Final  . Cholesterol 05/05/2017 117  <200 mg/dL Final  . HDL 05/05/2017 31* >40 mg/dL Final  . Triglycerides 05/05/2017 196* <150 mg/dL Final  . LDL Cholesterol (Calc) 05/05/2017 59  mg/dL (calc) Final   Comment: Reference range: <100 . Desirable range <100 mg/dL for primary prevention;   <  70 mg/dL for patients with CHD or diabetic patients  with > or = 2 CHD risk factors. Marland Kitchen LDL-C is now calculated using the Martin-Hopkins  calculation, which is a validated novel method providing  better accuracy than the Friedewald equation in the  estimation of LDL-C.  Cresenciano Genre et al. Annamaria Helling. 6384;536(46): 2061-2068  (http://education.[facility].com/faq/FAQ164)   . Total CHOL/HDL Ratio 05/05/2017 3.8  <5.0 (calc) Final  . Non-HDL Cholesterol (Calc) 05/05/2017 86  <130 mg/dL (calc) Final   Comment: For patients with diabetes plus 1 major ASCVD risk  factor, treating to a non-HDL-C goal of <100 mg/dL  (LDL-C of <70 mg/dL) is considered a therapeutic  option.   Appointment on 05/05/2017  Component Date Value Ref Range Status  . aPTT 05/05/2017 27  24 - 36 seconds Final  . Prothrombin Time 05/05/2017 15.5* 11.4 - 15.2 seconds Final  . INR 05/05/2017 1.24   Final  . Sodium 05/05/2017 135  135 - 145 mmol/L Final  . Potassium 05/05/2017 4.0  3.5 - 5.1 mmol/L Final  .  Chloride 05/05/2017 101  101 - 111 mmol/L Final  . CO2 05/05/2017 25  22 - 32 mmol/L Final  . Glucose, Bld 05/05/2017 102* 65 - 99 mg/dL Final  . BUN 05/05/2017 11  6 - 20 mg/dL Final  . Creatinine, Ser 05/05/2017 0.76  0.61 - 1.24 mg/dL Final  . Calcium 05/05/2017 8.9  8.9 - 10.3 mg/dL Final  . Total Protein 05/05/2017 6.7  6.5 - 8.1 g/dL Final  . Albumin 05/05/2017 3.8  3.5 - 5.0 g/dL Final  . AST 05/05/2017 23  15 - 41 U/L Final  . ALT 05/05/2017 19  17 - 63 U/L Final  . Alkaline Phosphatase 05/05/2017 68  38 - 126 U/L Final  . Total Bilirubin 05/05/2017 0.8  0.3 - 1.2 mg/dL Final  . GFR calc non Af Amer 05/05/2017 >60  >60 mL/min Final  . GFR calc Af Amer 05/05/2017 >60  >60 mL/min Final   Comment: (NOTE) The eGFR has been calculated using the CKD EPI equation. This calculation has not been validated in all clinical situations. eGFR's persistently <60 mL/min signify possible Chronic Kidney Disease.   . Anion gap 05/05/2017 9  5 - 15 Final  . WBC 05/05/2017 10.9* 4.0 - 10.5 K/uL Final  . RBC 05/05/2017 4.87  4.22 - 5.81 MIL/uL Final  . Hemoglobin 05/05/2017 15.0  13.0 - 17.0 g/dL Final  . HCT 05/05/2017 44.4  39.0 - 52.0 % Final  . MCV 05/05/2017 91.2  78.0 - 100.0 fL Final  . MCH 05/05/2017 30.8  26.0 - 34.0 pg Final  . MCHC 05/05/2017 33.8  30.0 - 36.0 g/dL Final  . RDW 05/05/2017 13.5  11.5 - 15.5 % Final  . Platelets 05/05/2017 258  150 - 400 K/uL Final  . Neutrophils Relative % 05/05/2017 57  % Final  . Neutro Abs 05/05/2017 6.3  1.7 - 7.7 K/uL Final  . Lymphocytes Relative 05/05/2017 28  % Final  . Lymphs Abs 05/05/2017 3.0  0.7 - 4.0 K/uL Final  . Monocytes Relative 05/05/2017 8  % Final  . Monocytes Absolute 05/05/2017 0.9  0.1 - 1.0 K/uL Final  . Eosinophils Relative 05/05/2017 7  % Final  . Eosinophils Absolute 05/05/2017 0.7  0.0 - 0.7 K/uL Final  . Basophils Relative 05/05/2017 0  % Final  . Basophils Absolute 05/05/2017 0.0  0.0 - 0.1 K/uL Final  Office Visit  on 05/05/2017  Component Date Value  Ref Range Status  . Coagulation Factor VIII 05/05/2017 93  57 - 163 % Final  . Ristocetin Co-factor, Plasma 05/05/2017 47* 50 - 200 % Final   Comment: (NOTE) Performed At: [facility] [place], [place] [id number] Lindon Romp MD [id number]   . Von Willebrand Antigen, Plasma 05/05/2017 68  50 - 200 % Final   Comment: (NOTE) This test was developed and its performance characteristics determined by [facility]. It has not been cleared or approved by the Food and Drug Administration.   . Fibrinogen 05/05/2017 410  210 - 475 mg/dL Final  . Interpretation 05/05/2017 Note   Final   Comment: (NOTE) ------------------------------- COAGULATION: VON WILLEBRAND FACTOR ASSESSMENT CURRENT RESULTS ASSESSMENT The VWF:Ag is normal. The VWF:RCo is slightly decreased. The FVIII is normal. VON WILLEBRAND FACTOR ASSESSMENT CURRENT RESULTS INTERPRETATION - Although the results are abnormal, the panel does not meet the laboratory criteria for VWD. The 2008 NHLBI VWD guideline (summary in Am J Hematol. 2009; 84(6):366-370) suggests that a diagnosis of VWD (excluding type 2N) be reserved for patients with VWF levels that fall below 30%. Results in the range of 30 to 50% may be associated with an increased risk for bleeding, and do not preclude a VWD diagnosis if supporting clinical history is present, nor preclude therapy to elevate VWF levels with a clinical bleeding risk. VWF:RCo/VWF:Ag ratio is less than 0.7 and for this reason type 2 VWD, including type 2A, 2B, and 36M, or platelet-type VWD cannot be excluded. Distinction between type 1 and type 2 VWD should not be                           made on VWF:RCo/VWF:Ag ratios only as there may be significant overlap between type 1 and type 2 VWD especially when ratios are between 0.5 and 0.7. This evaluation does not distinguish congenital from acquired von Willebrand syndrome (e.g.  secondary to hypothyroidism, lymphoproliferative disorders, certain cardiac conditions associated with increased shear stress). VON WILLEBRAND FACTOR ASSESSMENT - The VWF:RCo assay demonstrates significant variability and falsely low values are commonly seen due to preanalytical and analytical variables. VWF:RCo may be spuriously low in individuals with polymorphisms (reported in 17% of whites and 63% of African Americans) in the VWF gene (clinical testing not available) that alter the binding of ristocetin to VWF. (Blood. 2010; 116(2):280-286). VWF activity can also be measured using a collagen binding assay, which is labeled for research use only, but does not show interference due to these polymorphisms. Results may be falsely e                          levated and possibly falsely normal as VWF and FVIII may increase in samples drawn from patients (particularly children) who are visibly stressed at the time of phlebotomy, as acute phase reactants, or in response to certain drug therapies such as desmopressin. Repeat testing may be necessary before excluding a diagnosis of VWD especially if the clinical suspicion is high for an underlying bleeding disorder. The setting for phlebotomy should be as calm as possible and patients should be encouraged to sit quietly prior to the blood draw. VON WILLEBRAND FACTOR ASSESSMENT FURTHER CONSIDERATIONS - Consider repeat analysis on a new plasma sample to re-evaluate this pattern of results. If this pattern of results persists, further analysis will be needed to characterize VWD type. VON WILLEBRAND FACTOR ASSESSMENT DEFINITIONS - VWD - von Willebrand  disease; VWF - von Willebrand factor; VWF:Ag - VWF antigen; VWF:RCo - VWF ristocetin cofactor activity; FVIII - factor VIII act                          ivity. - MEDICAL DIRECTOR: For questions regarding panel interpretation, please contact Jake Bathe, M.D. at [facility] at [phone]. ------------------------------- DISCLAIMER These assessments and interpretations are provided as a convenience in support of the physician-patient relationship and are not intended to replace the physician's clinical judgment. They are derived from national guidelines in addition to other evidence and expert opinion. The clinician should consider this information within the context of clinical opinion and the individual patient. SEE GUIDANCE FOR VON WILLEBRAND FACTOR ASSESSMENT: (1) The National Heart, Lung and Blood Institute. The Diagnosis, Evaluation and Management of von Willebrand Disease. Janeal Holmes, MD: [place] Publication 21-2248. 2500. Available at vSpecials.com.pt. (2) Daryl Eastern et al. Carmin Muskrat J Hematol. 2009; 84(6):366-370. (3) Advance. 2                          004;10(3):199-217. (4) Pasi KJ et al. Haemophilia. 2004; 10(3):218-231. Performed At: [facility] [place], [place] [id number] Thomasene Ripple MD QX:[id number]     RADIOGRAPHIC STUDIES: I have personally reviewed the radiological images as listed and agree with the findings in the report  No results found.  ASSESSMENT/PLAN 56 year old male presents for pre-op clearance for history of abnormal bleeding time.  Coagulation workup demonstrated a mildly prolonged PT at 15.5, INR 1.24. PTT normal. Fibrinogen normal. Von Willebrand's panel was negative for von willebrand's disease.   PLAN: -Reviewed patient's coagulation workup with him. He has a mildly elevated PT which corrected with mixing study. 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 Therefore I will get a Factor VII assay today. I doubt that this mild elevation in PT is anything significant. His PT is definitely not elevated enough for him to experience significant bleeding from surgery.  -A normal  BT does not predict the safety of surgical procedures, nor does an abnormal BT predict for excessive bleeding. Since assessment of the BT is subject to considerable variation due to technical factors in executing the test, a normal range for the test varies from laboratory to laboratory, and cannot be generalized here. Of importance, the BT is not recommended as a preoperative screening test. -I will call him with the Factor 7 Assay levels. -He is cleared from hematology standpoint to proceed with his knee surgery with Dr. Aline Brochure.  -RTC in 3 months for follow up with repeat PT/INR.  Orders Placed This Encounter  Procedures  . Factor 7 assay  . Protime-INR    Standing Status:   Future    Number of Occurrences:   1    Standing Expiration Date:   05/20/2018    All questions were answered. The patient knows to call the clinic with any problems, questions or concerns.  This note was electronically signed.    Twana First, MD  05/20/2017 3:29 PM

## 2017-05-21 LAB — FACTOR 7 ASSAY: Factor VII Activity: 46 % — ABNORMAL LOW (ref 51–186)

## 2017-05-21 NOTE — Progress Notes (Signed)
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 Bleeding is unlikely with factor VII activity levels >10 percent. He can go for his knee surgery safely.

## 2017-05-23 ENCOUNTER — Telehealth: Payer: Self-pay | Admitting: Orthopedic Surgery

## 2017-05-23 NOTE — Telephone Encounter (Signed)
Patient returned nurse's call regarding pre-op appointment information. I confirmed, and also discussed out of work note, and have entered, as patient requested. He has question about ambulating on the knee until surgery, and also mentioned the strain on his opposite knee.  Please call his CELL # [phone].

## 2017-05-23 NOTE — Telephone Encounter (Signed)
I called him to discuss, he can ambulate on the knee, but has more pain of the other knee. I have made him an appointment for Nov 5th

## 2017-05-26 ENCOUNTER — Other Ambulatory Visit: Payer: Self-pay | Admitting: Orthopedic Surgery

## 2017-05-26 NOTE — Telephone Encounter (Signed)
Patient requests refills (aware of his upcoming appointment 06/02/17, and of pending surgery 06/12/17 : (1)  diclofenac (VOLTAREN) 75 MG EC tablet 60 tablet   - [place]   and (2) also asking for refill of pain medication: HYDROcodone-acetaminophen (NORCO/VICODIN) 5-325 MG tablet 30 tablet

## 2017-05-26 NOTE — Telephone Encounter (Signed)
Hydrocodone and Voltaren 75mg  both last filled 04/25/17 Ok to refill?   He has follow up with you on 06/02/17 and has knee surgery sch for November also 06/12/17 (has appt scheduled since his non operative knee is painful.   Pended

## 2017-05-28 MED ORDER — DICLOFENAC SODIUM 75 MG PO TBEC: 75.0000 mg | DELAYED_RELEASE_TABLET | Freq: Two times a day (BID) | ORAL | 0 refills | Status: DC

## 2017-05-28 MED ORDER — HYDROCODONE-ACETAMINOPHEN 5-325 MG PO TABS: 1.0000 | ORAL_TABLET | Freq: Four times a day (QID) | ORAL | 0 refills | Status: DC | PRN

## 2017-05-30 ENCOUNTER — Other Ambulatory Visit: Payer: Self-pay | Admitting: Family Medicine

## 2017-06-02 ENCOUNTER — Ambulatory Visit (INDEPENDENT_AMBULATORY_CARE_PROVIDER_SITE_OTHER): Payer: 59 | Admitting: Orthopedic Surgery

## 2017-06-02 ENCOUNTER — Ambulatory Visit (INDEPENDENT_AMBULATORY_CARE_PROVIDER_SITE_OTHER): Payer: 59

## 2017-06-02 VITALS — BP 131/84 | HR 102 | Ht 69.0 in | Wt 269.0 lb

## 2017-06-02 DIAGNOSIS — M25562 Pain in left knee: Secondary | ICD-10-CM

## 2017-06-02 NOTE — Progress Notes (Addendum)
Progress Note   Patient ID: Jason French, male   DOB: Oct 12, 1960, 56 y.o.   MRN: [id number]  Chief Complaint  Patient presents with  . Knee Pain    Left knee pain, no injury.    HPI  56 year old male scheduled for right knee surgery presents now with left knee mild to moderate dull aching constant pain swelling occasional locking   Review of Systems  Constitutional: Negative.   Skin: Negative.   Neurological: Negative for tingling.   No outpatient medications have been marked as taking for the 06/02/17 encounter (Office Visit) with [name], MD.    Past Medical History:  Diagnosis Date  . Cancer [facility])    adrenal cancer  . Diabetes mellitus without complication ([place])   . Diverticulitis   . Hyperlipidemia   . Hypertension   . Neuropathy   . RLS (restless legs syndrome)      No Known Allergies  BP 131/84   Pulse (!) 102   Ht 5\' 9"  (1.753 m)   Wt 269 lb (122 kg)   BMI 39.72 kg/m    Physical Exam  Musculoskeletal:       Left knee: He exhibits no effusion.   Gen. appearance the patient's appearance is normal with normal grooming and  hygiene The patient is oriented to person place and time Mood and affect are normal   Right Knee Exam   Muscle Strength  The patient has normal right knee strength.  Tenderness  The patient is experiencing tenderness in the medial joint line.  Range of Motion  Extension: normal  Flexion: normal   Tests  McMurray:  Medial - positive Lateral - negative Varus: negative Valgus: negative Drawer:  Anterior - negative    Posterior - negative  Other  Erythema: absent Scars: absent Sensation: normal Pulse: present Swelling: none   Left Knee Exam   Muscle Strength  The patient has normal left knee strength.  Tenderness  The patient is experiencing tenderness in the medial joint line.  Range of Motion  Extension: normal  Flexion: normal   Tests  McMurray:  Medial - negative Lateral - negative Varus:  negative Valgus: negative Drawer:  Anterior - negative     Posterior - negative  Other  Erythema: absent Scars: absent Sensation: normal Pulse: present Swelling: none Effusion: no effusion present       X-ray left knee shows medial compartment arthrosis Medical decision-making Encounter Diagnosis  Name Primary?  . Left knee pain, unspecified chronicity Yes      No orders of the defined types were placed in this encounter.  I recommended a cortisone shot but he would like to have it when he has his right knee surgery   [name], MD 06/02/2017 4:46 PM

## 2017-06-05 NOTE — Patient Instructions (Signed)
Jason French  06/05/2017     @PREFPERIOPPHARMACY @   Your procedure is scheduled on  06/12/2017 .  Report to [name] at  615   A.M.  Call this number if you have problems the morning of surgery:  [phone]   Remember:  Do not eat food or drink liquids after midnight.  Take these medicines the morning of surgery with A SIP OF WATER  Benazepril, gabapentin, hydrocodone.   Do not wear jewelry, make-up or nail polish.  Do not wear lotions, powders, or perfumes, or deoderant.  Do not shave 48 hours prior to surgery.  Men may shave face and neck.  Do not bring valuables to the hospital.  [name] is not responsible for any belongings or valuables.  Contacts, dentures or bridgework may not be worn into surgery.  Leave your suitcase in the car.  After surgery it may be brought to your room.  For patients admitted to the hospital, discharge time will be determined by your treatment team.  Patients discharged the day of surgery will not be allowed to drive home.   Name and phone number of your driver:   family Special instructions:    Please read over the following fact sheets that you were given. Anesthesia Post-op Instructions and Care and Recovery After Surgery      Knee Ligament Injury, Arthroscopy Arthroscopy is a surgical technique in which your health care provider examines your knee through a small, pencil-sized telescope (arthroscope). Often, repairs to injured ligaments can be done with instruments in the arthroscope. Arthroscopy is less invasive than open-knee surgery. Tell a health care provider about:  Any allergies you have.  All medicines you are taking, including vitamins, herbs, eye drops, creams, and over-the-counter medicines.  Any problems you or family members have had with anesthetic medicines.  Any blood disorders you have.  Any surgeries you have had.  Any medical conditions you have. What are the risks? Generally, this is  a safe procedure. However, as with any procedure, problems can occur. Possible problems include:  Infection.  Bleeding.  Stiffness.  What happens before the procedure?  Ask your health care provider about changing or stopping any regular medicines. Avoid taking aspirin or blood thinners as directed by your health care provider.  Do not eat or drink anything after midnight the night before surgery.  If you smoke, do not smoke for at least 2 weeks before your surgery.  Do not drink alcohol starting the day before your surgery.  Let your health care provider know if you develop a cold or any infection before your surgery.  Arrange for someone to drive you home after the surgery or after your hospital stay. Also arrange for someone to help you with activities during recovery. What happens during the procedure?  Small monitors will be put on your body. They are used to check your heart, blood pressure, and oxygen levels.  An IV access tube will be put into one of your veins. Medicine will be able to flow directly into your body through this IV tube.  You might be given a medicine to help you relax (sedative).  You will be given a medicine that makes you go to sleep (general anesthetic), and a breathing tube will be placed into your lungs during the procedure.  Several small incisions are made in your knee. Saline fluid is placed into one of the incisions to expand the knee and  clear away any blood in the knee.  Your health care provider will insert the arthroscope to examine the injured knee.  During arthroscopy, your health care provider may find a partial or complete tear in a ligament.  Tools can be inserted through the other incisions to repair the injured ligaments.  The incisions are then closed with absorbable stitches and covered with dressings. What happens after the procedure?  You will be taken to the recovery area where you will be monitored.  When you are awake,  stable, and taking fluids without problems, you will be allowed to go home. This information is not intended to replace advice given to you by your health care provider. Make sure you discuss any questions you have with your health care provider. Document Released: 07/12/2000 Document Revised: 12/21/2015 Document Reviewed: 02/24/2013 Elsevier Interactive Patient Education  2017 [place]. Knee Arthroscopy, Care After Refer to this sheet in the next few weeks. These instructions provide you with information about caring for yourself after your procedure. Your health care provider may also give you more specific instructions. Your treatment has been planned according to current medical practices, but problems sometimes occur. Call your health care provider if you have any problems or questions after your procedure. What can I expect after the procedure? After the procedure, it is common to have:  Soreness.  Pain.  Follow these instructions at home: Bathing  Do not take baths, swim, or use a hot tub until your health care provider approves. Incision care  There are many different ways to close and cover an incision, including stitches, skin glue, and adhesive strips. Follow your health care provider's instructions about: ? Incision care. ? Bandage (dressing) changes and removal. ? Incision closure removal.  Check your incision area every day for signs of infection. Watch for: ? Redness, swelling, or pain. ? Fluid, blood, or pus. Activity  Avoid strenuous activities for as long as directed by your health care provider.  Return to your normal activities as directed by your health care provider. Ask your health care provider what activities are safe for you.  Perform range-of-motion exercises only as directed by your health care provider.  Do not lift anything that is heavier than 10 lb (4.5 kg).  Do not drive or operate heavy machinery while taking pain medicine.  If you were  given crutches, use them as directed by your health care provider. Managing pain, stiffness, and swelling  If directed, apply ice to the injured area: ? Put ice in a plastic bag. ? Place a towel between your skin and the bag. ? Leave the ice on for 20 minutes, 2-3 times per day.  Raise the injured area above the level of your heart while you are sitting or lying down as directed by your health care provider. General instructions  Keep all follow-up visits as directed by your health care provider. This is important.  Take medicines only as directed by your health care provider.  Do not use any tobacco products, including cigarettes, chewing tobacco, or electronic cigarettes. If you need help quitting, ask your health care provider.  If you were given compression stockings, wear them as directed by your health care provider. These stockings help prevent blood clots and reduce swelling in your legs. Contact a health care provider if:  You have severe pain with any movement of your knee.  You notice a bad smell coming from the incision or dressing.  You have redness, swelling, or pain at the  site of your incision.  You have fluid, blood, or pus coming from your incision. Get help right away if:  You develop a rash.  You have a fever.  You have difficulty breathing or have shortness of breath.  You develop pain in your calves or in the back of your knee.  You develop chest pain.  You develop numbness or tingling in your leg or foot. This information is not intended to replace advice given to you by your health care provider. Make sure you discuss any questions you have with your health care provider. Document Released: 02/01/2005 Document Revised: 12/15/2015 Document Reviewed: 07/11/2014 Elsevier Interactive Patient Education  2017 [place] Anesthesia, Adult General anesthesia is the use of medicines to make a person "go to sleep" (be unconscious) for a medical  procedure. General anesthesia is often recommended when a procedure:  Is long.  Requires you to be still or in an unusual position.  Is major and can cause you to lose blood.  Is impossible to do without general anesthesia.  The medicines used for general anesthesia are called general anesthetics. In addition to making you sleep, the medicines:  Prevent pain.  Control your blood pressure.  Relax your muscles.  Tell a health care provider about:  Any allergies you have.  All medicines you are taking, including vitamins, herbs, eye drops, creams, and over-the-counter medicines.  Any problems you or family members have had with anesthetic medicines.  Types of anesthetics you have had in the past.  Any bleeding disorders you have.  Any surgeries you have had.  Any medical conditions you have.  Any history of heart or lung conditions, such as heart failure, sleep apnea, or chronic obstructive pulmonary disease (COPD).  Whether you are pregnant or may be pregnant.  Whether you use tobacco, alcohol, marijuana, or street drugs.  Any history of Armed forces logistics/support/administrative officer.  Any history of depression or anxiety. What are the risks? Generally, this is a safe procedure. However, problems may occur, including:  Allergic reaction to anesthetics.  Lung and heart problems.  Inhaling food or liquids from your stomach into your lungs (aspiration).  Injury to nerves.  Waking up during your procedure and being unable to move (rare).  Extreme agitation or a state of mental confusion (delirium) when you wake up from the anesthetic.  Air in the bloodstream, which can lead to stroke.  These problems are more likely to develop if you are having a major surgery or if you have an advanced medical condition. You can prevent some of these complications by answering all of your health care provider's questions thoroughly and by following all pre-procedure instructions. General anesthesia can  cause side effects, including:  Nausea or vomiting  A sore throat from the breathing tube.  Feeling cold or shivery.  Feeling tired, washed out, or achy.  Sleepiness or drowsiness.  Confusion or agitation.  What happens before the procedure? Staying hydrated Follow instructions from your health care provider about hydration, which may include:  Up to 2 hours before the procedure - you may continue to drink clear liquids, such as water, clear fruit juice, black coffee, and plain tea.  Eating and drinking restrictions Follow instructions from your health care provider about eating and drinking, which may include:  8 hours before the procedure - stop eating heavy meals or foods such as meat, fried foods, or fatty foods.  6 hours before the procedure - stop eating light meals or foods, such as toast  or cereal.  6 hours before the procedure - stop drinking milk or drinks that contain milk.  2 hours before the procedure - stop drinking clear liquids.  Medicines  Ask your health care provider about: ? Changing or stopping your regular medicines. This is especially important if you are taking diabetes medicines or blood thinners. ? Taking medicines such as aspirin and ibuprofen. These medicines can thin your blood. Do not take these medicines before your procedure if your health care provider instructs you not to. ? Taking new dietary supplements or medicines. Do not take these during the week before your procedure unless your health care provider approves them.  If you are told to take a medicine or to continue taking a medicine on the day of the procedure, take the medicine with sips of water. General instructions   Ask if you will be going home the same day, the following day, or after a longer hospital stay. ? Plan to have someone take you home. ? Plan to have someone stay with you for the first 24 hours after you leave the hospital or clinic.  For 3-6 weeks before the  procedure, try not to use any tobacco products, such as cigarettes, chewing tobacco, and e-cigarettes.  You may brush your teeth on the morning of the procedure, but make sure to spit out the toothpaste. What happens during the procedure?  You will be given anesthetics through a mask and through an IV tube in one of your veins.  You may receive medicine to help you relax (sedative).  As soon as you are asleep, a breathing tube may be used to help you breathe.  An anesthesia specialist will stay with you throughout the procedure. He or she will help keep you comfortable and safe by continuing to give you medicines and adjusting the amount of medicine that you get. He or she will also watch your blood pressure, pulse, and oxygen levels to make sure that the anesthetics do not cause any problems.  If a breathing tube was used to help you breathe, it will be removed before you wake up. The procedure may vary among health care providers and hospitals. What happens after the procedure?  You will wake up, often slowly, after the procedure is complete, usually in a recovery area.  Your blood pressure, heart rate, breathing rate, and blood oxygen level will be monitored until the medicines you were given have worn off.  You may be given medicine to help you calm down if you feel anxious or agitated.  If you will be going home the same day, your health care provider may check to make sure you can stand, drink, and urinate.  Your health care providers will treat your pain and side effects before you go home.  Do not drive for 24 hours if you received a sedative.  You may: ? Feel nauseous and vomit. ? Have a sore throat. ? Have mental slowness. ? Feel cold or shivery. ? Feel sleepy. ? Feel tired. ? Feel sore or achy, even in parts of your body where you did not have surgery. This information is not intended to replace advice given to you by your health care provider. Make sure you discuss  any questions you have with your health care provider. Document Released: 10/22/2007 Document Revised: 12/26/2015 Document Reviewed: 06/29/2015 Elsevier Interactive Patient Education  2018 [place] Anesthesia, Adult, Care After These instructions provide you with information about caring for yourself after your procedure. Your  health care provider may also give you more specific instructions. Your treatment has been planned according to current medical practices, but problems sometimes occur. Call your health care provider if you have any problems or questions after your procedure. What can I expect after the procedure? After the procedure, it is common to have:  Vomiting.  A sore throat.  Mental slowness.  It is common to feel:  Nauseous.  Cold or shivery.  Sleepy.  Tired.  Sore or achy, even in parts of your body where you did not have surgery.  Follow these instructions at home: For at least 24 hours after the procedure:  Do not: ? Participate in activities where you could fall or become injured. ? Drive. ? Use heavy machinery. ? Drink alcohol. ? Take sleeping pills or medicines that cause drowsiness. ? Make important decisions or sign legal documents. ? Take care of children on your own.  Rest. Eating and drinking  If you vomit, drink water, juice, or soup when you can drink without vomiting.  Drink enough fluid to keep your urine clear or pale yellow.  Make sure you have little or no nausea before eating solid foods.  Follow the diet recommended by your health care provider. General instructions  Have a responsible adult stay with you until you are awake and alert.  Return to your normal activities as told by your health care provider. Ask your health care provider what activities are safe for you.  Take over-the-counter and prescription medicines only as told by your health care provider.  If you smoke, do not smoke without  supervision.  Keep all follow-up visits as told by your health care provider. This is important. Contact a health care provider if:  You continue to have nausea or vomiting at home, and medicines are not helpful.  You cannot drink fluids or start eating again.  You cannot urinate after 8-12 hours.  You develop a skin rash.  You have fever.  You have increasing redness at the site of your procedure. Get help right away if:  You have difficulty breathing.  You have chest pain.  You have unexpected bleeding.  You feel that you are having a life-threatening or urgent problem. This information is not intended to replace advice given to you by your health care provider. Make sure you discuss any questions you have with your health care provider. Document Released: 10/21/2000 Document Revised: 12/18/2015 Document Reviewed: 06/29/2015 Elsevier Interactive Patient Education  [employer].

## 2017-06-09 ENCOUNTER — Other Ambulatory Visit (HOSPITAL_COMMUNITY): Payer: 59

## 2017-06-09 ENCOUNTER — Other Ambulatory Visit: Payer: Self-pay

## 2017-06-09 ENCOUNTER — Ambulatory Visit (INDEPENDENT_AMBULATORY_CARE_PROVIDER_SITE_OTHER): Payer: 59 | Admitting: Family Medicine

## 2017-06-09 ENCOUNTER — Encounter (HOSPITAL_COMMUNITY)
Admission: RE | Admit: 2017-06-09 | Discharge: 2017-06-09 | Disposition: A | Discharge status: Home / Self Care | Payer: 59 | Source: Ambulatory Visit | Attending: Orthopedic Surgery | Admitting: Orthopedic Surgery

## 2017-06-09 ENCOUNTER — Encounter (HOSPITAL_COMMUNITY): Payer: Self-pay

## 2017-06-09 ENCOUNTER — Encounter: Payer: Self-pay | Admitting: Family Medicine

## 2017-06-09 VITALS — BP 110/76 | HR 99 | Temp 97.9°F | Resp 18 | Ht 69.0 in | Wt 274.0 lb

## 2017-06-09 DIAGNOSIS — E785 Hyperlipidemia, unspecified: Secondary | ICD-10-CM | POA: Diagnosis not present

## 2017-06-09 DIAGNOSIS — E119 Type 2 diabetes mellitus without complications: Secondary | ICD-10-CM

## 2017-06-09 DIAGNOSIS — M2241 Chondromalacia patellae, right knee: Secondary | ICD-10-CM | POA: Diagnosis not present

## 2017-06-09 DIAGNOSIS — E1140 Type 2 diabetes mellitus with diabetic neuropathy, unspecified: Secondary | ICD-10-CM | POA: Diagnosis not present

## 2017-06-09 DIAGNOSIS — G2581 Restless legs syndrome: Secondary | ICD-10-CM | POA: Diagnosis not present

## 2017-06-09 DIAGNOSIS — Z85858 Personal history of malignant neoplasm of other endocrine glands: Secondary | ICD-10-CM | POA: Diagnosis not present

## 2017-06-09 DIAGNOSIS — Z8719 Personal history of other diseases of the digestive system: Secondary | ICD-10-CM | POA: Diagnosis not present

## 2017-06-09 DIAGNOSIS — I10 Essential (primary) hypertension: Secondary | ICD-10-CM | POA: Diagnosis not present

## 2017-06-09 DIAGNOSIS — G4730 Sleep apnea, unspecified: Secondary | ICD-10-CM | POA: Diagnosis not present

## 2017-06-09 DIAGNOSIS — M23221 Derangement of posterior horn of medial meniscus due to old tear or injury, right knee: Secondary | ICD-10-CM | POA: Diagnosis not present

## 2017-06-09 DIAGNOSIS — Z7984 Long term (current) use of oral hypoglycemic drugs: Secondary | ICD-10-CM | POA: Diagnosis not present

## 2017-06-09 DIAGNOSIS — Z79899 Other long term (current) drug therapy: Secondary | ICD-10-CM | POA: Diagnosis not present

## 2017-06-09 DIAGNOSIS — M1711 Unilateral primary osteoarthritis, right knee: Secondary | ICD-10-CM | POA: Diagnosis not present

## 2017-06-09 DIAGNOSIS — F17200 Nicotine dependence, unspecified, uncomplicated: Secondary | ICD-10-CM | POA: Diagnosis not present

## 2017-06-09 DIAGNOSIS — L918 Other hypertrophic disorders of the skin: Secondary | ICD-10-CM | POA: Diagnosis not present

## 2017-06-09 HISTORY — DX: Sleep apnea, unspecified: G47.30

## 2017-06-09 HISTORY — DX: Unspecified osteoarthritis, unspecified site: M19.90

## 2017-06-09 NOTE — Progress Notes (Signed)
Subjective:    Patient ID: Jason French, male    DOB: 05/25/1961, 56 y.o.   MRN: [id number]  Medication Refill     04/22/16 Patient is here today to establish care. 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 The mass was removed and he was followed for 10 years with periodic CT scans and MRIs. He states that this is resolved. However I have no old records to review. He is also diabetic who takes metformin. He denies any hypoglycemic episodes. He denies any polyuria, polydipsia, or blurred vision. His blood pressure is elevated today but he states this usually less than 140/90 at home. He denies any chest pain shortness of breath or dyspnea on exertion. He is also on Lipitor for hyperlipidemia. He denies any myalgias or right upper quadrant pain. He does have a large surgical scar in the right upper quadrant where the adrenal mass was removed. Past medical history significant for diverticulitis status post rupture and abscess formation. This required operative drainage and also the patient states that a portion of his colon was removed. Last colonoscopy was less than 10 years ago. He is due for PSA. He refuses a flu shot. He refuses pneumonia vaccine.  At that time, my plan was: Blood pressure is elevated today. Patient is confident is better at home. He does report feeling nervous today. He will check his blood pressure frequently at home and notify me of the values. Return fasting for a CBC, CMP, fasting lipid panel, and a PSA. I will also check a hemoglobin A1c as well as a urine microalbumin. If A1c is acceptable, consider switching metformin to the toes of for weight loss. Patient's colonoscopy is up-to-date. He refuses a flu shot as well as the pneumonia shot. I will check a PSA to screen for prostate cancer. 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  Patient has restless leg syndrome and symptoms are not well controlled. He also complains of some numbness and tingling in his feet as well as some neuropathic pain in his feet at night. Therefore I'll add gabapentin initially 300 mg by mouth daily at bedtime. Patient may take more often if necessary for nerve pain  08/09/16 Has not seen any benefit in managing his neuropathy since starting gabapentin. He continues to have pins and needles pain and tingling in his feet on a daily basis. He is interested in trying to increase the dose. He has been taking Vick toes of as we discussed at his last visit. His hemoglobin A1c was outstanding at 6.1 and therefore we discontinued metformin and replaced with Victoza. Unfortunately he has actually gained weight since switching to Vick toes a. Furthermore he is having hypoglycemic episodes. He is interesting in trying another alternative. His blood pressure is elevated today but he swears this is due to whitecoat syndrome. I need to correct his medication list listed below. He is also taking benazepril 40 mg a day not 20 mg a day. He denies any chest pain shortness of breath or dyspnea on exertion.  AT that time, my plan was:  Check blood pressure on a daily basis and call me with the values in one week. If consistently  greater than 140/90, I will add hydrochlorothiazide. I will recheck a hemoglobin A1c. However I'm going to discontinue Victoza and replace with Jardiance 25 mg a day. Furthermore he is no longer on metformin. Recheck hemoglobin A1c in 6 months. Work on diet neck size and weight loss. Increase gabapentin gradually to 600 mg by mouth 3 times a day for neuropathy  05/06/17 Patient was recently in the emergency room and was diagnosed with tinea pedis on both feet and was given Lamisil pills in addition to doxycycline for secondary cellulitis. He  states that his feet are better though by no means are back to normal. I examined both feet. Toes are pink with scale on the dorsums and in the intertriginous  spaces between the toes.  In several areas, the skin has cracked open and is weeping. There is crusting secondary to the weeping. There is also similar plaques on the plantar surfaces of both midfeet.  He has completed a full course of Lamisil and although improvement was seen, there has been no means resolution. His blood pressure was also extremely high in the emergency room as well as in our office. His systolic blood pressure has been averaging between 150 and 170. His diastolic blood pressure has been averaging between 90 and 100. He is overdue for hemoglobin A1c. He is also overdue for fasting lab work. He is planning to have knee surgery and has recently seen hematology due to an elevated prothrombin time. They are working up the patient for increased risk of bleeding. He is also requesting removal of skin tags however our time did not allow discussion of all of these issues.  At that time, my plan was: I will start the patient on Diflucan 150 mg by mouth every other day for 2 weeks. Meanwhile I want the patient to use Lotrimin 1% cream twice daily as a barrier cream, Imodium, and an antifungal medication as well to treat the severe case of athlete's foot.  His blood pressure is out of control. I have asked him to add hydrochlorothiazide 25 mg a day and recheck blood pressure in one month. I will check a CMP along with a hemoglobin A1c to assess the control of his diabetes prior to surgery. His goal hemoglobin A1c is less than 6.5. I will defer removing the skin tags in his right axilla until his follow-up office visit to ensure that the patient returns so that I can make sure his blood pressure is well controlled prior to surgery  06/09/17 Patient's blood pressure is much better today 110/76.  He is requesting diabetic shoes.  He has severe  neuropathy in his feet.  He states that his feet are numb the majority of the time.  He has diminished sensation to 10 g monofilament in multiple areas on the plantar aspects of all of his toes.  He also has a history of diabetes mellitus spelled out above and therefore would qualify for diabetic shoes.  He is here today however mainly to have his skin tags removed.  There are 11 skin tags in his right axilla ranging in size from 3 mm to 6 mm.  There is a large 7 mm skin tag on his right gluteus.  This makes 12 skin tags in total that he would request to be removed Past Medical History:  Diagnosis Date  . Cancer [facility])    adrenal cancer  . Diabetes mellitus without complication ([place])   . Diverticulitis   . Hyperlipidemia   . Hypertension   .  Neuropathy   . RLS (restless legs syndrome)    Past Surgical History:  Procedure Laterality Date  . COLON SURGERY    . RESECTION OF ABDOMINAL MASS     adrenal mass right (cancer)   Current Outpatient Medications on File Prior to Visit  Medication Sig Dispense Refill  . atorvastatin (LIPITOR) 10 MG tablet TAKE (1) TABLET BY MOUTH AT BEDTIME. 30 tablet 2  . benazepril (LOTENSIN) 20 MG tablet TAKE 2 TABLETS DAILY. 60 tablet 2  . diclofenac (VOLTAREN) 75 MG EC tablet Take 1 tablet (75 mg total) by mouth 2 (two) times daily. 60 tablet 0  . gabapentin (NEURONTIN) 300 MG capsule TAKE 2 CAPSULES BY MOUTH THREE TIMES A DAY. 180 capsule 0  . hydrochlorothiazide (HYDRODIURIL) 25 MG tablet Take 1 tablet (25 mg total) by mouth daily. 90 tablet 3  . HYDROcodone-acetaminophen (NORCO/VICODIN) 5-325 MG tablet Take 1 tablet by mouth every 6 (six) hours as needed for moderate pain. 30 tablet 0  . metFORMIN (GLUCOPHAGE) 1000 MG tablet TAKE (1) TABLET BY MOUTH TWICE A DAY WITH A MEAL. 180 tablet 1  . rOPINIRole (REQUIP) 1 MG tablet TAKE (2) TABLETS BY MOUTH AT BEDTIME. 60 tablet 2   No current facility-administered medications on file prior to visit.    No Known  Allergies Social History   Socioeconomic History  . Marital status: Married    Spouse name: Not on file  . Number of children: Not on file  . Years of education: Not on file  . Highest education level: Not on file  Social Needs  . Financial resource strain: Not on file  . Food insecurity - worry: Not on file  . Food insecurity - inability: Not on file  . Transportation needs - medical: Not on file  . Transportation needs - non-medical: Not on file  Occupational History  . Not on file  Tobacco Use  . Smoking status: Current Every Day Smoker  . Smokeless tobacco: Never Used  Substance and Sexual Activity  . Alcohol use: Yes    Comment: Socail  . Drug use: No  . Sexual activity: Yes  Other Topics Concern  . Not on file  Social History Narrative  . Not on file      Review of Systems  All other systems reviewed and are negative.      Objective:   Physical Exam  Constitutional: He appears well-developed and well-nourished. No distress.  Eyes: Conjunctivae and EOM are normal. Pupils are equal, round, and reactive to light. Right eye exhibits no discharge. Left eye exhibits no discharge. No scleral icterus.  Neck: Normal range of motion. Neck supple. No JVD present. No thyromegaly present.  Cardiovascular: Normal rate, regular rhythm, normal heart sounds and intact distal pulses. Exam reveals no gallop and no friction rub.  No murmur heard. Pulmonary/Chest: Effort normal and breath sounds normal. No stridor. No respiratory distress. He has no wheezes. He has no rales. He exhibits no tenderness.  Abdominal: Soft. Bowel sounds are normal.  Musculoskeletal: Normal range of motion.  Skin: Skin is warm. Rash noted. He is not diaphoretic. There is erythema.  Vitals reviewed.         Assessment & Plan:  Skin tags, multiple acquired Essential hypertension Diabetic neuropathy  We will send a prescription to [facility] for diabetic shoes.  Patient has diabetes as  well as peripheral neuropathy stemming from the diabetes.  He also has a history of severe skin infections on both feet.  His  blood pressure is much better controlled today.  I will make no changes in his medication.  I anesthetize each of the 12 skin tags, 11 in his right axilla, one on his right gluteus with 0.1% lidocaine with epinephrine.  Each of the skin tags was then cleaned with Betadine and removed using a scalpel.  Hemostasis was achieved with Drysol and Silvadene cream.  Patient tolerated the procedures well without complication

## 2017-06-10 ENCOUNTER — Telehealth: Payer: Self-pay | Admitting: Orthopedic Surgery

## 2017-06-10 DIAGNOSIS — M1712 Unilateral primary osteoarthritis, left knee: Secondary | ICD-10-CM

## 2017-06-10 NOTE — Telephone Encounter (Signed)
Patient would like to know if prescription for walker can be issue, prior to surgery, which is scheduled 06/12/17 - or must it be issued at time of discharge from hospital?

## 2017-06-10 NOTE — Telephone Encounter (Signed)
He will have knee scope, ok to provide order for the walker?

## 2017-06-11 ENCOUNTER — Telehealth: Payer: Self-pay | Admitting: Family Medicine

## 2017-06-11 DIAGNOSIS — M1712 Unilateral primary osteoarthritis, left knee: Secondary | ICD-10-CM | POA: Diagnosis not present

## 2017-06-11 NOTE — Telephone Encounter (Signed)
yes

## 2017-06-11 NOTE — H&P (Signed)
Chief Complaint  Patient presents with  . Follow-up      Right knee, no injury, to discuss surgery, referred by Dr. Luna Glasgow.      Jason French is a 56 y.o. male.   HPI 56 year old male presents for evaluation of his right knee. 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 He does remember now slipping on the step of his truck and falling down   In any event he's had a course of nonoperative treatment had chronic pain in his knee is MRI shows he has a meniscal tear. He says he can tolerate it now and needs to wait until he can get some time off from work before he can have the arthroscopic surgery   I explained his findings to him he basically has a medial meniscus tear he has osteoarthritis of the knee.   In the course of his history taking it turns out that he and his daughter both have prolonged bleeding times and he's never had this worked up although he did have surgery to remove a tumor in his abdomen and had a diaphragmatic laceration or cutting to get the tumor out and didn't have any problems with that surgery. He was told afterwards that his bleeding time was prolonged   He will have a hematologist see him prior to our surgery because he has sleep apnea and I'm concerned that if we get into some bleeding putting the tube in he may not be allowed to be intubated. I'm also concerned that if we try to do a spinal has some excessive bleeding we get into trouble from his excessive bleeding time      Review of Systems Review of Systems  Constitutional: Negative.   HENT: Negative.   Eyes: Negative.   Respiratory: Positive for shortness of breath.   Cardiovascular: Negative for chest pain.  Gastrointestinal: Negative.   Genitourinary: Negative.   Musculoskeletal: Negative.   Skin: Negative.   Neurological: Negative.   Endo/Heme/Allergies: Bruises/bleeds easily.  Psychiatric/Behavioral: Negative.           Past Medical History:   Diagnosis Date  . Cancer [facility])      adrenal cancer  . Diabetes mellitus without complication ([place])    . Diverticulitis    . Hyperlipidemia    . Hypertension    . Neuropathy    . RLS (restless legs syndrome)             Past Surgical History:  Procedure Laterality Date  . COLON SURGERY      . RESECTION OF ABDOMINAL MASS        adrenal mass right (cancer)      No Known Allergies         Current Outpatient Prescriptions  Medication Sig Dispense Refill  . atorvastatin (LIPITOR) 10 MG tablet TAKE (1) TABLET BY MOUTH AT BEDTIME. 30 tablet 0  . benazepril (LOTENSIN) 20 MG tablet Take 40 mg by mouth daily.       . diclofenac (VOLTAREN) 75 MG EC tablet Take 1 tablet (75 mg total) by mouth 2 (two) times daily. 60 tablet 1  . gabapentin (NEURONTIN) 300 MG capsule TAKE 2 CAPSULES BY MOUTH THREE TIMES A DAY. 180 capsule 2  . HYDROcodone-acetaminophen (NORCO/VICODIN) 5-325 MG tablet One tablet every four hours as needed for acute pain.  Limit of five days per [place] statue. 30 tablet 0  . metFORMIN (GLUCOPHAGE) 1000 MG tablet Take 1,000  mg by mouth 2 (two) times daily with a meal.      . rOPINIRole (REQUIP) 1 MG tablet TAKE (2) TABLETS BY MOUTH AT BEDTIME. 60 tablet 5    No current facility-administered medications for this visit.         Physical Exam Ht 5\' 11"  (1.803 m)   Wt 273 lb (123.8 kg)   BMI 38.08 kg/m  Physical Exam    The patient is well developed well nourished and well groomed.   Orientation to person place and time is normal   Mood is pleasant. Affect normal  Ambulatory status is remarkable for NO  limp in the involved extremity    Taken from Dr. Brooke Bonito evaluation: Musculoskeletal: He exhibits tenderness (Pain right knee, effusion 1+, ROM 0 to 105 with medial joint pain, positive medial McMurray and 1/2+ drawer sign, limp to the right, NV intact.  No distal edema.  Left knee negative.).        Motor exam: Grade 5 motor strength in the quadriceps  musculature    Skin: Warm dry and intact over the right leg                       Neuro: normal sensation   Vascular: 2+ DP pulse with normal color and no edema.    Currently the LEFT  lower extremity and knee examination revealed no tenderness or swelling, full range of motion without contracture subluxation atrophy or tremor. Normal muscle tone no instability and the neurovascular status of the limb is normal.       Assessment and Plan:  IMAGING:  Osteoarthritis of the knee   MRI with torn medial meniscus and osteoarthritis     IMPRESSION: 1. Complex tear of the posterior horn- body junction of the medial meniscus. 2. Tricompartmental cartilage abnormalities as described above. 3. Intact ACL with severe mucinous degeneration.     Arthroscopy of the right knee with partial medial meniscectomy

## 2017-06-11 NOTE — Telephone Encounter (Signed)
Pt wanting to get diabetic shoes - order and lov faxed to [facility] at [phone].

## 2017-06-11 NOTE — Telephone Encounter (Signed)
Faxed to [facility] for him, per his request.

## 2017-06-11 NOTE — Telephone Encounter (Signed)
Left message, order ready, does he want to pick up order, or have me fax it?

## 2017-06-12 ENCOUNTER — Encounter (HOSPITAL_COMMUNITY)
Admission: RE | Disposition: A | Discharge status: Home / Self Care | Payer: Self-pay | Source: Ambulatory Visit | Attending: Orthopedic Surgery

## 2017-06-12 ENCOUNTER — Ambulatory Visit (HOSPITAL_COMMUNITY): Payer: 59 | Admitting: Anesthesiology

## 2017-06-12 ENCOUNTER — Ambulatory Visit (HOSPITAL_COMMUNITY)
Admission: RE | Admit: 2017-06-12 | Discharge: 2017-06-12 | Disposition: A | Discharge status: Home / Self Care | Payer: 59 | Source: Ambulatory Visit | Attending: Orthopedic Surgery | Admitting: Orthopedic Surgery

## 2017-06-12 ENCOUNTER — Encounter (HOSPITAL_COMMUNITY): Payer: Self-pay | Admitting: *Deleted

## 2017-06-12 DIAGNOSIS — M1711 Unilateral primary osteoarthritis, right knee: Secondary | ICD-10-CM | POA: Insufficient documentation

## 2017-06-12 DIAGNOSIS — M2241 Chondromalacia patellae, right knee: Secondary | ICD-10-CM | POA: Diagnosis not present

## 2017-06-12 DIAGNOSIS — F17200 Nicotine dependence, unspecified, uncomplicated: Secondary | ICD-10-CM | POA: Insufficient documentation

## 2017-06-12 DIAGNOSIS — M1712 Unilateral primary osteoarthritis, left knee: Secondary | ICD-10-CM

## 2017-06-12 DIAGNOSIS — Z7984 Long term (current) use of oral hypoglycemic drugs: Secondary | ICD-10-CM | POA: Insufficient documentation

## 2017-06-12 DIAGNOSIS — G2581 Restless legs syndrome: Secondary | ICD-10-CM | POA: Insufficient documentation

## 2017-06-12 DIAGNOSIS — Z79899 Other long term (current) drug therapy: Secondary | ICD-10-CM | POA: Insufficient documentation

## 2017-06-12 DIAGNOSIS — Z85858 Personal history of malignant neoplasm of other endocrine glands: Secondary | ICD-10-CM | POA: Insufficient documentation

## 2017-06-12 DIAGNOSIS — E785 Hyperlipidemia, unspecified: Secondary | ICD-10-CM | POA: Insufficient documentation

## 2017-06-12 DIAGNOSIS — G4730 Sleep apnea, unspecified: Secondary | ICD-10-CM | POA: Insufficient documentation

## 2017-06-12 DIAGNOSIS — M23221 Derangement of posterior horn of medial meniscus due to old tear or injury, right knee: Secondary | ICD-10-CM | POA: Diagnosis not present

## 2017-06-12 DIAGNOSIS — E1140 Type 2 diabetes mellitus with diabetic neuropathy, unspecified: Secondary | ICD-10-CM | POA: Insufficient documentation

## 2017-06-12 DIAGNOSIS — Z8719 Personal history of other diseases of the digestive system: Secondary | ICD-10-CM | POA: Insufficient documentation

## 2017-06-12 DIAGNOSIS — I10 Essential (primary) hypertension: Secondary | ICD-10-CM | POA: Insufficient documentation

## 2017-06-12 DIAGNOSIS — S83241A Other tear of medial meniscus, current injury, right knee, initial encounter: Secondary | ICD-10-CM | POA: Diagnosis not present

## 2017-06-12 HISTORY — PX: KNEE ARTHROSCOPY WITH MEDIAL MENISECTOMY: SHX5651

## 2017-06-12 LAB — GLUCOSE, CAPILLARY
GLUCOSE-CAPILLARY: 115 mg/dL — AB (ref 65–99)
GLUCOSE-CAPILLARY: 129 mg/dL — AB (ref 65–99)

## 2017-06-12 SURGERY — ARTHROSCOPY, KNEE, WITH MEDIAL MENISCECTOMY
Anesthesia: General | Site: Knee | Laterality: Right

## 2017-06-12 MED ORDER — LIDOCAINE HCL 1 % IJ SOLN
10.0000 mL | Freq: Once | INTRAMUSCULAR | Status: DC
  Filled 2017-06-12: qty 10

## 2017-06-12 MED ORDER — MIDAZOLAM HCL 2 MG/2ML IJ SOLN
1.0000 mg | INTRAMUSCULAR | Status: AC
  Administered 2017-06-12: 2 mg via INTRAVENOUS

## 2017-06-12 MED ORDER — LACTATED RINGERS IV SOLN
INTRAVENOUS | Status: DC
  Administered 2017-06-12: 1000 mL via INTRAVENOUS

## 2017-06-12 MED ORDER — LIDOCAINE HCL (CARDIAC) 10 MG/ML IV SOLN
INTRAVENOUS | Status: DC | PRN
  Administered 2017-06-12: 50 mg via INTRAVENOUS

## 2017-06-12 MED ORDER — GLYCOPYRROLATE 0.2 MG/ML IJ SOLN
0.2000 mg | Freq: Once | INTRAMUSCULAR | Status: AC
  Administered 2017-06-12: 0.2 mg via INTRAVENOUS

## 2017-06-12 MED ORDER — PROPOFOL 10 MG/ML IV BOLUS
INTRAVENOUS | Status: DC | PRN
  Administered 2017-06-12: 30 mg via INTRAVENOUS
  Administered 2017-06-12: 170 mg via INTRAVENOUS

## 2017-06-12 MED ORDER — BUPIVACAINE-EPINEPHRINE (PF) 0.5% -1:200000 IJ SOLN
INTRAMUSCULAR | Status: DC | PRN
  Administered 2017-06-12: 60 mL via PERINEURAL

## 2017-06-12 MED ORDER — PHENYLEPHRINE HCL 10 MG/ML IJ SOLN
INTRAMUSCULAR | Status: DC | PRN
  Administered 2017-06-12 (×2): 80 ug via INTRAVENOUS
  Administered 2017-06-12 (×2): 60 ug via INTRAVENOUS
  Administered 2017-06-12: 80 ug via INTRAVENOUS

## 2017-06-12 MED ORDER — CEFAZOLIN SODIUM-DEXTROSE 1-4 GM/50ML-% IV SOLN
INTRAVENOUS | Status: AC
  Filled 2017-06-12: qty 50

## 2017-06-12 MED ORDER — PHENYLEPHRINE 40 MCG/ML (10ML) SYRINGE FOR IV PUSH (FOR BLOOD PRESSURE SUPPORT)
PREFILLED_SYRINGE | INTRAVENOUS | Status: AC
  Filled 2017-06-12: qty 10

## 2017-06-12 MED ORDER — FENTANYL CITRATE (PF) 100 MCG/2ML IJ SOLN
INTRAMUSCULAR | Status: DC | PRN
  Administered 2017-06-12: 25 ug via INTRAVENOUS
  Administered 2017-06-12: 50 ug via INTRAVENOUS

## 2017-06-12 MED ORDER — CHLORHEXIDINE GLUCONATE 4 % EX LIQD: 60.0000 mL | Freq: Once | CUTANEOUS | Status: DC

## 2017-06-12 MED ORDER — CEFAZOLIN SODIUM-DEXTROSE 2-4 GM/100ML-% IV SOLN
INTRAVENOUS | Status: AC
  Filled 2017-06-12: qty 100

## 2017-06-12 MED ORDER — LIDOCAINE HCL (PF) 1 % IJ SOLN
10.0000 mL | Freq: Once | INTRAMUSCULAR | Status: DC
  Filled 2017-06-12: qty 10

## 2017-06-12 MED ORDER — DEXTROSE 5 % IV SOLN
INTRAVENOUS | Status: DC | PRN
  Administered 2017-06-12: 3 g via INTRAVENOUS

## 2017-06-12 MED ORDER — BUPIVACAINE-EPINEPHRINE (PF) 0.5% -1:200000 IJ SOLN
INTRAMUSCULAR | Status: AC
  Filled 2017-06-12: qty 60

## 2017-06-12 MED ORDER — HYDROCODONE-ACETAMINOPHEN 7.5-325 MG PO TABS: 1.0000 | ORAL_TABLET | ORAL | 0 refills | Status: DC | PRN

## 2017-06-12 MED ORDER — LIDOCAINE HCL (PF) 1 % IJ SOLN
INTRAMUSCULAR | Status: DC | PRN
  Administered 2017-06-12: 10 mL

## 2017-06-12 MED ORDER — SODIUM CHLORIDE 0.9 % IR SOLN
Status: DC | PRN
  Administered 2017-06-12: 1000 mL

## 2017-06-12 MED ORDER — FENTANYL CITRATE (PF) 250 MCG/5ML IJ SOLN
INTRAMUSCULAR | Status: AC
  Filled 2017-06-12: qty 5

## 2017-06-12 MED ORDER — GLYCOPYRROLATE 0.2 MG/ML IJ SOLN
INTRAMUSCULAR | Status: AC
  Filled 2017-06-12: qty 1

## 2017-06-12 MED ORDER — ONDANSETRON HCL 4 MG/2ML IJ SOLN
4.0000 mg | Freq: Once | INTRAMUSCULAR | Status: AC
  Administered 2017-06-12: 4 mg via INTRAVENOUS

## 2017-06-12 MED ORDER — METHYLPREDNISOLONE ACETATE 40 MG/ML IJ SUSP
40.0000 mg | Freq: Once | INTRAMUSCULAR | Status: DC
  Filled 2017-06-12: qty 1

## 2017-06-12 MED ORDER — ROCURONIUM BROMIDE 50 MG/5ML IV SOLN
INTRAVENOUS | Status: AC
  Filled 2017-06-12: qty 1

## 2017-06-12 MED ORDER — CEFAZOLIN SODIUM 10 G IJ SOLR
3.0000 g | INTRAMUSCULAR | Status: DC
  Filled 2017-06-12: qty 3000

## 2017-06-12 MED ORDER — LIDOCAINE HCL (PF) 1 % IJ SOLN
INTRAMUSCULAR | Status: AC
  Filled 2017-06-12: qty 5

## 2017-06-12 MED ORDER — ONDANSETRON HCL 4 MG/2ML IJ SOLN
INTRAMUSCULAR | Status: AC
  Filled 2017-06-12: qty 2

## 2017-06-12 MED ORDER — METHYLPREDNISOLONE ACETATE 40 MG/ML IJ SUSP
INTRAMUSCULAR | Status: DC | PRN
  Administered 2017-06-12: 40 mg

## 2017-06-12 MED ORDER — DEXAMETHASONE SODIUM PHOSPHATE 4 MG/ML IJ SOLN
INTRAMUSCULAR | Status: AC
  Filled 2017-06-12: qty 1

## 2017-06-12 MED ORDER — SODIUM CHLORIDE 0.9 % IR SOLN
Status: DC | PRN
  Administered 2017-06-12 (×3): 3000 mL

## 2017-06-12 MED ORDER — MIDAZOLAM HCL 2 MG/2ML IJ SOLN
INTRAMUSCULAR | Status: AC
  Filled 2017-06-12: qty 2

## 2017-06-12 MED ORDER — PROPOFOL 10 MG/ML IV BOLUS
INTRAVENOUS | Status: AC
  Filled 2017-06-12: qty 40

## 2017-06-12 MED ORDER — SUCCINYLCHOLINE CHLORIDE 20 MG/ML IJ SOLN
INTRAMUSCULAR | Status: AC
  Filled 2017-06-12: qty 1

## 2017-06-12 MED ORDER — DEXAMETHASONE SODIUM PHOSPHATE 4 MG/ML IJ SOLN
4.0000 mg | Freq: Once | INTRAMUSCULAR | Status: AC
  Administered 2017-06-12: 4 mg via INTRAVENOUS

## 2017-06-12 MED ORDER — FENTANYL CITRATE (PF) 100 MCG/2ML IJ SOLN: 25.0000 ug | INTRAMUSCULAR | Status: DC | PRN

## 2017-06-12 SURGICAL SUPPLY — 49 items
ARTHROWAND PARAGON T2 (SURGICAL WAND)
BAG HAMPER (MISCELLANEOUS) ×2 IMPLANT
BANDAGE ELASTIC 6 LF NS (GAUZE/BANDAGES/DRESSINGS) ×2 IMPLANT
BLADE AGGRESSIVE PLUS 4.0 (BLADE) ×2 IMPLANT
BLADE SURG SZ11 CARB STEEL (BLADE) ×2 IMPLANT
CHLORAPREP W/TINT 26ML (MISCELLANEOUS) ×2 IMPLANT
CLOTH BEACON ORANGE TIMEOUT ST (SAFETY) ×2 IMPLANT
COOLER CRYO IC GRAV AND TUBE (ORTHOPEDIC SUPPLIES) ×2 IMPLANT
CUFF CRYO KNEE18X23 MED (MISCELLANEOUS) ×2 IMPLANT
CUFF TOURNIQUET SINGLE 34IN LL (TOURNIQUET CUFF) ×2 IMPLANT
CUTTER ANGLED DBL BITE 4.5 (BURR) ×2 IMPLANT
DECANTER SPIKE VIAL GLASS SM (MISCELLANEOUS) ×4 IMPLANT
GAUZE SPONGE 4X4 12PLY STRL (GAUZE/BANDAGES/DRESSINGS) ×2 IMPLANT
GAUZE SPONGE 4X4 16PLY XRAY LF (GAUZE/BANDAGES/DRESSINGS) ×2 IMPLANT
GAUZE XEROFORM 5X9 LF (GAUZE/BANDAGES/DRESSINGS) ×2 IMPLANT
GLOVE BIOGEL PI IND STRL 7.0 (GLOVE) ×1 IMPLANT
GLOVE BIOGEL PI INDICATOR 7.0 (GLOVE) ×1
GLOVE SKINSENSE NS SZ8.0 LF (GLOVE) ×1
GLOVE SKINSENSE STRL SZ8.0 LF (GLOVE) ×1 IMPLANT
GLOVE SS N UNI LF 8.5 STRL (GLOVE) ×2 IMPLANT
GOWN STRL REUS W/ TWL LRG LVL3 (GOWN DISPOSABLE) ×1 IMPLANT
GOWN STRL REUS W/TWL LRG LVL3 (GOWN DISPOSABLE) ×1
GOWN STRL REUS W/TWL XL LVL3 (GOWN DISPOSABLE) ×2 IMPLANT
HLDR LEG FOAM (MISCELLANEOUS) ×1 IMPLANT
IV NS IRRIG 3000ML ARTHROMATIC (IV SOLUTION) ×6 IMPLANT
KIT BLADEGUARD II DBL (SET/KITS/TRAYS/PACK) ×2 IMPLANT
KIT ROOM TURNOVER AP CYSTO (KITS) ×2 IMPLANT
LEG HOLDER FOAM (MISCELLANEOUS) ×1
MANIFOLD NEPTUNE II (INSTRUMENTS) ×2 IMPLANT
MARKER SKIN DUAL TIP RULER LAB (MISCELLANEOUS) ×2 IMPLANT
NEEDLE HYPO 18GX1.5 BLUNT FILL (NEEDLE) ×2 IMPLANT
NEEDLE HYPO 21X1.5 SAFETY (NEEDLE) ×2 IMPLANT
NEEDLE SPNL 18GX3.5 QUINCKE PK (NEEDLE) ×2 IMPLANT
NS IRRIG 1000ML POUR BTL (IV SOLUTION) ×2 IMPLANT
PACK ARTHRO LIMB DRAPE STRL (MISCELLANEOUS) ×2 IMPLANT
PAD ABD 5X9 TENDERSORB (GAUZE/BANDAGES/DRESSINGS) ×2 IMPLANT
PAD ARMBOARD 7.5X6 YLW CONV (MISCELLANEOUS) ×2 IMPLANT
PADDING CAST COTTON 6X4 STRL (CAST SUPPLIES) ×2 IMPLANT
PADDING WEBRIL 6 STERILE (GAUZE/BANDAGES/DRESSINGS) ×2 IMPLANT
PROBE BIPOLAR 50 DEGREE SUCT (MISCELLANEOUS) IMPLANT
PROBE BIPOLAR ATHRO 135MM 90D (MISCELLANEOUS) ×2 IMPLANT
SET ARTHROSCOPY INST (INSTRUMENTS) ×2 IMPLANT
SET ARTHROSCOPY PUMP TUBE (IRRIGATION / IRRIGATOR) ×2 IMPLANT
SET BASIN LINEN APH (SET/KITS/TRAYS/PACK) ×2 IMPLANT
SUT ETHILON 3 0 FSL (SUTURE) ×2 IMPLANT
SYR 30ML LL (SYRINGE) ×2 IMPLANT
SYRINGE 10CC LL (SYRINGE) ×2 IMPLANT
TUBE CONNECTING 12X1/4 (SUCTIONS) ×6 IMPLANT
WAND ARTHRO PARAGON T2 (SURGICAL WAND) IMPLANT

## 2017-06-12 NOTE — Anesthesia Postprocedure Evaluation (Signed)
Anesthesia Post Note  Patient: Jason French  Procedure(s) Performed: KNEE ARTHROSCOPY WITH PARTIAL MEDIAL MENISECTOMY (Right Knee)  Patient location during evaluation: Short Stay Anesthesia Type: General Level of consciousness: awake and alert and oriented Pain management: pain level controlled Vital Signs Assessment: post-procedure vital signs reviewed and stable Respiratory status: spontaneous breathing Cardiovascular status: blood pressure returned to baseline Postop Assessment: adequate PO intake Anesthetic complications: no     Last Vitals:  Vitals:   06/12/17 0915 06/12/17 0941  BP: 95/69 112/68  Pulse: 83 81  Resp: 17 16  Temp:  37 C  SpO2: 91% 94%    Last Pain:  Vitals:   06/12/17 0941  TempSrc: Oral                 Ger Ringenberg

## 2017-06-12 NOTE — Op Note (Addendum)
Preop diagnosis torn medial meniscus right knee   Postop diagnosis same   Procedure arthroscopy and partial medial meniscectomy right knee Surgeon Aline Brochure  General LMA anesthesia  Antibiotic Ancef  Complications none  Sponge and needle count correct  60 cc Marcaine with epinephrine injected  Right knee arthroscopy dictation  The patient was identified in the preoperative holding area using 2 approved identification mechanisms. The chart was reviewed and updated. The surgical site was confirmed as left knee and marked with an indelible marker.  The patient was taken to the operating room for anesthesia. After successful general LMA anesthesia, Ancef was used as IV antibiotics.  The patient was placed in the supine position with the (left) the operative extremity in an arthroscopic leg holder and the opposite extremity in a padded leg holder.  The timeout was executed.  A lateral portal was established with an 11 blade and the scope was introduced into the joint. A diagnostic arthroscopy was performed in circumferential manner examining the entire knee joint. A medial portal was established and the diagnostic arthroscopy was repeated using a probe to palpate intra-articular structures as they were encountered.   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 The meniscal fragments were removed with a motorized shaver. The meniscus was balanced with a combination of a motorized shaver and a 90 degree wand until a stable rim was obtained.   The arthroscopic pump was placed on the  wash mode and any excess debris was removed from the joint using suction.  60 cc of Marcaine with epinephrine was injected through the arthroscope.  The portals were closed with 3-0 nylon suture.  A sterile bandage, Ace wrap and Cryo/Cuff was placed and the Cryo/Cuff was activated. The patient was taken to the recovery room in stable condition.   Separate procedure at patient's request  Procedure note left knee injection verbal consent was obtained to inject left knee joint  Timeout was completed to confirm the site of injection  The medications used were 40 mg of Depo-Medrol and 1% lidocaine 3 cc  Anesthesia was provided by ethyl chloride and the skin was prepped with alcohol.  After cleaning the skin with alcohol a 20-gauge needle was used to inject the left knee joint. There were no complications. A sterile bandage was applied.

## 2017-06-12 NOTE — Transfer of Care (Signed)
Immediate Anesthesia Transfer of Care Note  Patient: Jason French  Procedure(s) Performed: KNEE ARTHROSCOPY WITH PARTIAL MEDIAL MENISECTOMY (Right Knee)  Patient Location: PACU  Anesthesia Type:General  Level of Consciousness: awake, alert  and oriented  Airway & Oxygen Therapy: Patient Spontanous Breathing  Post-op Assessment: Report given to RN and Post -op Vital signs reviewed and stable  Post vital signs: Reviewed and stable  Last Vitals:  Vitals:   06/12/17 0715 06/12/17 0730  BP: 102/63 102/66  Resp: 20 19  Temp:    SpO2: 93% 92%    Last Pain:  Vitals:   06/12/17 0649  TempSrc: Oral      Patients Stated Pain Goal: 9 (59/74/71 8550)  Complications: No apparent anesthesia complications

## 2017-06-12 NOTE — Anesthesia Procedure Notes (Signed)
Procedure Name: LMA Insertion Date/Time: 06/12/2017 7:47 AM Performed by: Ollen Bowl, CRNA Pre-anesthesia Checklist: Patient identified, Patient being monitored, Emergency Drugs available, Timeout performed and Suction available Patient Re-evaluated:Patient Re-evaluated prior to induction Oxygen Delivery Method: Circle System Utilized Preoxygenation: Pre-oxygenation with 100% oxygen Induction Type: IV induction Ventilation: Mask ventilation without difficulty LMA: LMA inserted LMA Size: 4.0 and 5.0 Number of attempts: 1 Placement Confirmation: positive ETCO2 and breath sounds checked- equal and bilateral Comments: Inserted #4 LMA with inadequate seal, placed #5 with air leak at approximately 20cm.

## 2017-06-12 NOTE — Brief Op Note (Addendum)
06/12/2017  8:36 AM  PATIENT:  Jason French  56 y.o. male  PRE-OPERATIVE DIAGNOSIS:  right medial meniscus tear  POST-OPERATIVE DIAGNOSIS:  right medial meniscus tear  PROCEDURE:  Procedure(s): KNEE ARTHROSCOPY WITH PARTIAL MEDIAL MENISECTOMY ([id number]  Operative findings torn medial meniscus posterior horn complex tear, mild chondromalacia patella trochlea medial femoral condyle medial tibial plateau  Lateral compartment and notch area normal   SURGEON:  Surgeon(s) and Role:    * [name], MD - Primary  PHYSICIAN ASSISTANT:   ASSISTANTS: none   ANESTHESIA:   general  EBL:  none   BLOOD ADMINISTERED:none  DRAINS: none   LOCAL MEDICATIONS USED:  MARCAINE     SPECIMEN:  No Specimen  DISPOSITION OF SPECIMEN:  N/A  COUNTS:  YES  TOURNIQUET:  * Missing tourniquet times found for documented tourniquets in log: [id number] *  DICTATION: .Dragon Dictation  PLAN OF CARE: Discharge to home after PACU  PATIENT DISPOSITION:  PACU - hemodynamically stable.   Delay start of Pharmacological VTE agent (>24hrs) due to surgical blood loss or risk of bleeding: not applicable    [id number]

## 2017-06-12 NOTE — Anesthesia Preprocedure Evaluation (Signed)
Anesthesia Evaluation  Patient identified by MRN, date of birth, ID band Patient awake    Reviewed: Allergy & Precautions, NPO status , Patient's Chart, lab work & pertinent test results  Airway Mallampati: II  TM Distance: >3 FB Neck ROM: Full    Dental  (+) Teeth Intact   Pulmonary sleep apnea , Current Smoker,    breath sounds clear to auscultation       Cardiovascular hypertension, Pt. on medications  Rhythm:Regular Rate:Normal     Neuro/Psych negative psych ROS   GI/Hepatic negative GI ROS, Neg liver ROS,   Endo/Other  diabetes, Type 2, Oral Hypoglycemic Agents  Renal/GU negative Renal ROS     Musculoskeletal   Abdominal   Peds  Hematology   Anesthesia Other Findings   Reproductive/Obstetrics                             Anesthesia Physical Anesthesia Plan  ASA: II  Anesthesia Plan: General   Post-op Pain Management:    Induction: Intravenous  PONV Risk Score and Plan:   Airway Management Planned: LMA  Additional Equipment:   Intra-op Plan:   Post-operative Plan: Extubation in OR  Informed Consent: I have reviewed the patients History and Physical, chart, labs and discussed the procedure including the risks, benefits and alternatives for the proposed anesthesia with the patient or authorized representative who has indicated his/her understanding and acceptance.     Plan Discussed with:   Anesthesia Plan Comments:         Anesthesia Quick Evaluation

## 2017-06-12 NOTE — Interval H&P Note (Signed)
History and Physical Interval Note:  06/12/2017 7:20 AM  Jason French  has presented today for surgery, with the diagnosis of right medial meniscus tear  The various methods of treatment have been discussed with the patient and family. After consideration of risks, benefits and other options for treatment, the patient has consented to  Procedure(s): KNEE ARTHROSCOPY WITH MEDIAL MENISECTOMY (Right) as a surgical intervention .  The patient's history has been reviewed, patient examined, no change in status, stable for surgery.  I have reviewed the patient's chart and labs.  Questions were answered to the patient's satisfaction.     [name]

## 2017-06-16 ENCOUNTER — Other Ambulatory Visit: Payer: Self-pay | Admitting: Family Medicine

## 2017-06-16 ENCOUNTER — Ambulatory Visit (INDEPENDENT_AMBULATORY_CARE_PROVIDER_SITE_OTHER): Payer: 59 | Admitting: Orthopedic Surgery

## 2017-06-16 DIAGNOSIS — Z4889 Encounter for other specified surgical aftercare: Secondary | ICD-10-CM

## 2017-06-16 DIAGNOSIS — Z9889 Other specified postprocedural states: Secondary | ICD-10-CM

## 2017-06-16 NOTE — Telephone Encounter (Signed)
Medication refilled per protocol. 

## 2017-06-16 NOTE — Progress Notes (Signed)
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

## 2017-06-30 ENCOUNTER — Ambulatory Visit (INDEPENDENT_AMBULATORY_CARE_PROVIDER_SITE_OTHER): Payer: 59 | Admitting: Orthopedic Surgery

## 2017-06-30 ENCOUNTER — Encounter: Payer: Self-pay | Admitting: Orthopedic Surgery

## 2017-06-30 VITALS — BP 134/82 | HR 107 | Ht 69.0 in | Wt 274.0 lb

## 2017-06-30 DIAGNOSIS — Z9889 Other specified postprocedural states: Secondary | ICD-10-CM

## 2017-06-30 DIAGNOSIS — Z4889 Encounter for other specified surgical aftercare: Secondary | ICD-10-CM

## 2017-06-30 MED ORDER — HYDROCODONE-ACETAMINOPHEN 5-325 MG PO TABS: 1.0000 | ORAL_TABLET | Freq: Four times a day (QID) | ORAL | 0 refills | Status: DC | PRN

## 2017-06-30 MED ORDER — DICLOFENAC SODIUM 75 MG PO TBEC: 75.0000 mg | DELAYED_RELEASE_TABLET | Freq: Two times a day (BID) | ORAL | 0 refills | Status: DC

## 2017-06-30 NOTE — Patient Instructions (Signed)
Meds ordered this encounter  Medications  . diclofenac (VOLTAREN) 75 MG EC tablet    Sig: Take 1 tablet (75 mg total) by mouth 2 (two) times daily.    Dispense:  60 tablet    Refill:  0  . HYDROcodone-acetaminophen (NORCO) 5-325 MG tablet    Sig: Take 1 tablet by mouth every 6 (six) hours as needed for moderate pain.    Dispense:  30 tablet    Refill:  0

## 2017-06-30 NOTE — Progress Notes (Signed)
POST OP VISIT   Patient ID: Jason French, male   DOB: [birth year], 56 y.o.   MRN: [id number]  Chief Complaint  Patient presents with  . Follow-up    Recheck on righ tknee, DOS 06-12-17.    No diagnosis found.  S/P right knee arthroscopy and partial medial meniscectomy  Findings at surgery:    Arthroscopy right knee on 15 November   the operative findings are    1. torn medial meniscus posterior horn complex tear,  2. chondral lesion medial femoral condyle mild grade 1 and tibial plateau grade 1 3. Lateral: normal meniscus and normal articular cartilage except for one small fissure of 5 mm on the lateral femoral condyle 4. 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  He said he did a lot of steps and a lot of walking during his Thanksgiving break at [facility] caused some swelling  He has regained his range of motion small effusion he still using his cane  We will refill his medication see him in mid January he can go back to work December 16

## 2017-07-16 ENCOUNTER — Other Ambulatory Visit: Payer: Self-pay | Admitting: Family Medicine

## 2017-07-16 DIAGNOSIS — G629 Polyneuropathy, unspecified: Secondary | ICD-10-CM

## 2017-08-13 ENCOUNTER — Ambulatory Visit: Payer: 59 | Admitting: Orthopedic Surgery

## 2017-08-15 ENCOUNTER — Other Ambulatory Visit: Payer: Self-pay | Admitting: Family Medicine

## 2017-08-15 DIAGNOSIS — G629 Polyneuropathy, unspecified: Secondary | ICD-10-CM

## 2017-08-21 ENCOUNTER — Ambulatory Visit (HOSPITAL_COMMUNITY): Payer: 59 | Admitting: Internal Medicine

## 2017-08-21 ENCOUNTER — Inpatient Hospital Stay (HOSPITAL_COMMUNITY): Payer: 59 | Attending: Internal Medicine

## 2017-08-27 ENCOUNTER — Encounter: Payer: Self-pay | Admitting: Orthopedic Surgery

## 2017-08-27 ENCOUNTER — Ambulatory Visit (INDEPENDENT_AMBULATORY_CARE_PROVIDER_SITE_OTHER): Payer: 59 | Admitting: Orthopedic Surgery

## 2017-08-27 VITALS — BP 139/85 | HR 100 | Ht 69.0 in | Wt 271.0 lb

## 2017-08-27 DIAGNOSIS — Z4889 Encounter for other specified surgical aftercare: Secondary | ICD-10-CM

## 2017-08-27 DIAGNOSIS — M1712 Unilateral primary osteoarthritis, left knee: Secondary | ICD-10-CM

## 2017-08-27 DIAGNOSIS — Z9889 Other specified postprocedural states: Secondary | ICD-10-CM

## 2017-08-27 DIAGNOSIS — M25462 Effusion, left knee: Secondary | ICD-10-CM | POA: Diagnosis not present

## 2017-08-27 NOTE — Patient Instructions (Signed)
Steps to Quit Smoking Smoking tobacco can be bad for your health. It can also affect almost every organ in your body. Smoking puts you and people around you at risk for many serious Jason French-lasting (chronic) diseases. Quitting smoking is hard, but it is one of the best things that you can do for your health. It is never too late to quit. What are the benefits of quitting smoking? When you quit smoking, you lower your risk for getting serious diseases and conditions. They can include:  Lung cancer or lung disease.  Heart disease.  Stroke.  Heart attack.  Not being able to have children (infertility).  Weak bones (osteoporosis) and broken bones (fractures).  If you have coughing, wheezing, and shortness of breath, those symptoms may get better when you quit. You may also get sick less often. If you are pregnant, quitting smoking can help to lower your chances of having a baby of low birth weight. What can I do to help me quit smoking? Talk with your doctor about what can help you quit smoking. Some things you can do (strategies) include:  Quitting smoking totally, instead of slowly cutting back how much you smoke over a period of time.  Going to in-person counseling. You are more likely to quit if you go to many counseling sessions.  Using resources and support systems, such as: ? Online chats with a counselor. ? Phone quitlines. ? Printed self-help materials. ? Support groups or group counseling. ? Text messaging programs. ? Mobile phone apps or applications.  Taking medicines. Some of these medicines may have nicotine in them. If you are pregnant or breastfeeding, do not take any medicines to quit smoking unless your doctor says it is okay. Talk with your doctor about counseling or other things that can help you.  Talk with your doctor about using more than one strategy at the same time, such as taking medicines while you are also going to in-person counseling. This can help make  quitting easier. What things can I do to make it easier to quit? Quitting smoking might feel very hard at first, but there is a lot that you can do to make it easier. Take these steps:  Talk to your family and friends. Ask them to support and encourage you.  Call phone quitlines, reach out to support groups, or work with a counselor.  Ask people who smoke to not smoke around you.  Avoid places that make you want (trigger) to smoke, such as: ? Bars. ? Parties. ? Smoke-break areas at work.  Spend time with people who do not smoke.  Lower the stress in your life. Stress can make you want to smoke. Try these things to help your stress: ? Getting regular exercise. ? Deep-breathing exercises. ? Yoga. ? Meditating. ? Doing a body scan. To do this, close your eyes, focus on one area of your body at a time from head to toe, and notice which parts of your body are tense. Try to relax the muscles in those areas.  Download or buy apps on your mobile phone or tablet that can help you stick to your quit plan. There are many free apps, such as QuitGuide from the CDC (Centers for Disease Control and Prevention). You can find more support from smokefree.gov and other websites.  This information is not intended to replace advice given to you by your health care provider. Make sure you discuss any questions you have with your health care provider. Document Released: 05/11/2009 Document   Revised: 03/12/2016 Document Reviewed: 11/29/2014 Elsevier Interactive Patient Education  2018 Elsevier Inc.  

## 2017-08-27 NOTE — Progress Notes (Signed)
Chief Complaint  Patient presents with  . Follow-up    S/P Right Knee Arthroscopy DOS 06/12/17   LEFT KNEE PAIN AND SWELLING   RIGHT KNEE PAIN AFTER SURGERY  Regarding his right knee he still says he has some with the right knee but is getting better  He has no swelling in the knee is regained his stability to the knee  Separate problem left knee pain and swelling weakness since he started requiring more weightbearing on the left knee after surgery  Symptoms now since late November  He reports swelling painful range of motion difficulty with weightbearing and somewhat weakness in the left knee Review of swelling systems no swelling or rash over the left knee   BP 139/85   Pulse 100   Ht 5\' 9"  (1.753 m)   Wt 271 lb (122.9 kg)   BMI 40.02 kg/m   Physical Exam  Constitutional: He is oriented to person, place, and time. He appears well-developed and well-nourished.  Vital signs have been reviewed and are stable. Gen. appearance the patient is well-developed and well-nourished with normal grooming and hygiene.   Musculoskeletal:       Legs: No significant limp is noted when he is walking  Neurological: He is alert and oriented to person, place, and time.  Skin: Skin is warm and dry. No erythema.  Psychiatric: He has a normal mood and affect.  Vitals reviewed.   Encounter Diagnoses  Name Primary?  . S/P right knee arthroscopy   . Primary osteoarthritis of left knee   . Aftercare following surgery   . Effusion of knee joint, left Yes    I recommended and he consented to aspiration injection left knee  I was able to pull off 25 cc of clear yellow fluid  Continue diclofenac 75 mg twice a day return in 1 month  Procedure note injection and aspiration left knee joint  Verbal consent was obtained to aspirate and inject the left knee joint   Timeout was completed to confirm the site of aspiration and injection  An 18-gauge needle was used to aspirate the left knee joint  from a suprapatellar lateral approach.  The medications used were 40 mg of Depo-Medrol and 1% lidocaine 3 cc  Anesthesia was provided by ethyl chloride and the skin was prepped with alcohol.  After cleaning the skin with alcohol an 18-gauge needle was used to aspirate the right knee joint.  We obtained 25 cc of fluid  We followed this by injection of 40 mg of Depo-Medrol and 3 cc 1% lidocaine.  There were no complications. A sterile bandage was applied.

## 2017-08-28 ENCOUNTER — Other Ambulatory Visit: Payer: Self-pay | Admitting: Family Medicine

## 2017-08-28 ENCOUNTER — Telehealth: Payer: Self-pay | Admitting: Orthopedic Surgery

## 2017-08-28 MED ORDER — DICLOFENAC SODIUM 75 MG PO TBEC: 75.0000 mg | DELAYED_RELEASE_TABLET | Freq: Two times a day (BID) | ORAL | 0 refills | Status: DC

## 2017-08-28 NOTE — Telephone Encounter (Signed)
Patient said when he was seen yesterday he thought  Dr. Aline Brochure was going to give him a refill on Diclofenac 75  Mgs.  He said his pharmacy does not have this.    He wants a prescription for Diclofenac 75 mgs.       Sig: Take 1 tablet (75 mg total) by mouth 2 (two) times daily.   He wants this prescription sent in to [facility]

## 2017-08-28 NOTE — Telephone Encounter (Signed)
Resent to [facility], was sent to incorrect pharmacy yesterday.

## 2017-10-02 ENCOUNTER — Other Ambulatory Visit: Payer: Self-pay | Admitting: Orthopedic Surgery

## 2017-10-08 ENCOUNTER — Telehealth: Payer: Self-pay | Admitting: Family Medicine

## 2017-10-08 NOTE — Telephone Encounter (Signed)
Pt would like to start Wellbutrin for smoking. He states he is done with his surgery and finish all those meds and would like to restart Wellbutrin for the smoking. Please advise.

## 2017-10-09 MED ORDER — BUPROPION HCL ER (XL) 150 MG PO TB24: 150.0000 mg | ORAL_TABLET | Freq: Every day | ORAL | 0 refills | Status: DC

## 2017-10-09 NOTE — Telephone Encounter (Signed)
Pt aware of provider recommendations and med sent to pharm

## 2017-10-09 NOTE — Telephone Encounter (Signed)
Start wellbutrin xr 150 mg poqam and increase to 300 mg poqam in 1 week.

## 2017-10-10 ENCOUNTER — Encounter (HOSPITAL_COMMUNITY): Payer: Self-pay | Admitting: Adult Health

## 2017-10-10 ENCOUNTER — Other Ambulatory Visit: Payer: Self-pay

## 2017-10-10 ENCOUNTER — Inpatient Hospital Stay (HOSPITAL_COMMUNITY): Payer: 59 | Attending: Internal Medicine

## 2017-10-10 ENCOUNTER — Inpatient Hospital Stay (HOSPITAL_BASED_OUTPATIENT_CLINIC_OR_DEPARTMENT_OTHER): Payer: 59 | Admitting: Adult Health

## 2017-10-10 ENCOUNTER — Ambulatory Visit (INDEPENDENT_AMBULATORY_CARE_PROVIDER_SITE_OTHER): Payer: 59 | Admitting: Orthopedic Surgery

## 2017-10-10 VITALS — BP 120/66 | HR 76 | Ht 69.0 in | Wt 271.0 lb

## 2017-10-10 VITALS — BP 120/66 | HR 91 | Temp 99.0°F | Resp 20 | Wt 274.4 lb

## 2017-10-10 DIAGNOSIS — F17210 Nicotine dependence, cigarettes, uncomplicated: Secondary | ICD-10-CM | POA: Diagnosis not present

## 2017-10-10 DIAGNOSIS — M1990 Unspecified osteoarthritis, unspecified site: Secondary | ICD-10-CM

## 2017-10-10 DIAGNOSIS — R791 Abnormal coagulation profile: Secondary | ICD-10-CM | POA: Diagnosis not present

## 2017-10-10 DIAGNOSIS — I10 Essential (primary) hypertension: Secondary | ICD-10-CM | POA: Insufficient documentation

## 2017-10-10 DIAGNOSIS — Z7984 Long term (current) use of oral hypoglycemic drugs: Secondary | ICD-10-CM | POA: Diagnosis not present

## 2017-10-10 DIAGNOSIS — Z79899 Other long term (current) drug therapy: Secondary | ICD-10-CM

## 2017-10-10 DIAGNOSIS — E119 Type 2 diabetes mellitus without complications: Secondary | ICD-10-CM | POA: Insufficient documentation

## 2017-10-10 DIAGNOSIS — Z9889 Other specified postprocedural states: Secondary | ICD-10-CM

## 2017-10-10 DIAGNOSIS — M1712 Unilateral primary osteoarthritis, left knee: Secondary | ICD-10-CM | POA: Diagnosis not present

## 2017-10-10 DIAGNOSIS — E785 Hyperlipidemia, unspecified: Secondary | ICD-10-CM

## 2017-10-10 LAB — PROTIME-INR
INR: 1.19
PROTHROMBIN TIME: 15 s (ref 11.4–15.2)

## 2017-10-10 NOTE — Progress Notes (Signed)
Progress Note   Patient ID: Jason French, male   DOB: Jan 08, 1961, 57 y.o.   MRN: [id number]  Chief Complaint  Patient presents with  . Follow-up    Recheck on bilateral knees.Jason French 11.15.2018    57 year old male treated for arthritis with aspiration injection after arthroscopy back in November had an injection about a month ago says he is no better no worse he seems to do better when he is on his arthritis medication  He has a twinge of pain intermittently his left knee did get better after aspiration injection  Is back on his NSAIDs at this point     ROS No outpatient medications have been marked as taking for the 10/10/17 encounter (Office Visit) with [name], MD.    No Known Allergies   BP 120/66   Pulse 76   Ht 5\' 9"  (1.753 m)   Wt 271 lb (122.9 kg)   BMI 40.02 kg/m   Physical Exam   Medical decision-making Encounter Diagnosis  Name Primary?  . S/P right knee arthroscopy 06/12/17 Yes    As I discussed with him (10-minute appointment) he will wait until his knee becomes more symptomatic before he considers knee replacement surgery we will schedule him for 79-month follow-up probably need to take some x-rays at that time if he gets worse before then or has a flareup we will see him for possible injection  No orders of the defined types were placed in this encounter.    Jason Abbott, MD 10/10/2017 11:10 AM

## 2017-10-10 NOTE — Progress Notes (Signed)
[address], [place] [address]   CLINIC:  Medical Oncology/Hematology  PCP:  Susy Frizzle, MD [address] [place] [address] [phone]   REASON FOR VISIT:  Follow-up for Elevated prothrombin time (PT)  CURRENT THERAPY: Observation    HISTORY OF PRESENT ILLNESS:  (From Dr. Laverle Patter note on 05/20/17)       INTERVAL HISTORY:  [name] 57 y.o. male returns for routine follow-up for elevated prothrombin time (PT).   Here today unaccompanied.    Overall, he tells me he has been feeling well. Appetite and energy levels both 100%.    Denies any recent bleeding or abnormal bruising episodes.  He underwent his knee surgery as scheduled; denies any intraoperative or postoperative bleeding or complications.  He sees his PCP regularly.  He is asking if he can be released from follow-up at the cancer center today, "because I have to take off work to come here, and I feel like everything is going okay and I do not need to come back."  He works as a Administrator.  Currently smokes about 1-1.5 packs/day.  Started smoking as an early teenager (about 40 years ago).  Otherwise, he is largely without complaints today.    REVIEW OF SYSTEMS:  Review of Systems  Constitutional: Negative.  Negative for chills, fatigue and fever.  HENT:  Negative.  Negative for lump/mass and nosebleeds.   Eyes: Negative.   Respiratory: Negative.  Negative for cough and shortness of breath.   Cardiovascular: Negative.  Negative for chest pain and leg swelling.  Gastrointestinal: Negative.  Negative for abdominal pain, blood in stool, constipation, diarrhea, nausea and vomiting.  Endocrine: Negative.   Genitourinary: Negative.  Negative for dysuria and hematuria.   Musculoskeletal: Negative.  Negative for arthralgias.  Skin: Negative.  Negative for rash.  Neurological: Negative.  Negative for dizziness and headaches.  Hematological: Negative.   Negative for adenopathy. Does not bruise/bleed easily.  Psychiatric/Behavioral: Negative.  Negative for depression and sleep disturbance. The patient is not nervous/anxious.      PAST MEDICAL/SURGICAL HISTORY:  Past Medical History:  Diagnosis Date  . Arthritis   . Cancer [facility])    adrenal cancer  . Diabetes mellitus without complication ([place])   . Diverticulitis   . Hyperlipidemia   . Hypertension   . Neuropathy   . RLS (restless legs syndrome)   . Sleep apnea    Past Surgical History:  Procedure Laterality Date  . COLON SURGERY     ruptured diverticulosis  . KNEE ARTHROSCOPY WITH MEDIAL MENISECTOMY Right 06/12/2017   Procedure: KNEE ARTHROSCOPY WITH PARTIAL MEDIAL MENISECTOMY;  Surgeon: [name], MD;  Location: [facility];  Service: Orthopedics;  Laterality: Right;  . RESECTION OF ABDOMINAL MASS     adrenal mass right (cancer)     SOCIAL HISTORY:  Social History   Socioeconomic History  . Marital status: Married    Spouse name: Not on file  . Number of children: Not on file  . Years of education: Not on file  . Highest education level: Not on file  Social Needs  . Financial resource strain: Not on file  . Food insecurity - worry: Not on file  . Food insecurity - inability: Not on file  . Transportation needs - medical: Not on file  . Transportation needs - non-medical: Not on file  Occupational History  . Not on file  Tobacco Use  . Smoking status: Current  Every Day Smoker    Packs/day: 0.50    Years: 40.00    Pack years: 20.00    Types: Cigarettes  . Smokeless tobacco: Never Used  Substance and Sexual Activity  . Alcohol use: Yes    Comment: social  . Drug use: No  . Sexual activity: Yes  Other Topics Concern  . Not on file  Social History Narrative  . Not on file    FAMILY HISTORY:  Family History  Problem Relation Age of Onset  . Diverticulitis Mother   . Hypertension Mother   . Diverticulitis Father   . Cancer Father   . Hypertension  Father     CURRENT MEDICATIONS:  Outpatient Encounter Medications as of 10/10/2017  Medication Sig  . atorvastatin (LIPITOR) 10 MG tablet TAKE (1) TABLET BY MOUTH AT BEDTIME.  . benazepril (LOTENSIN) 20 MG tablet TAKE 2 TABLETS DAILY.  Marland Kitchen gabapentin (NEURONTIN) 300 MG capsule TAKE 2 CAPSULES BY MOUTH THREE TIMES A DAY.  . hydrochlorothiazide (HYDRODIURIL) 25 MG tablet Take 1 tablet (25 mg total) by mouth daily.  . metFORMIN (GLUCOPHAGE) 1000 MG tablet TAKE (1) TABLET BY MOUTH TWICE A DAY WITH A MEAL.  Marland Kitchen rOPINIRole (REQUIP) 1 MG tablet TAKE (2) TABLETS BY MOUTH AT BEDTIME.  . [DISCONTINUED] benazepril (LOTENSIN) 20 MG tablet TAKE 2 TABLETS DAILY.  . [DISCONTINUED] buPROPion (WELLBUTRIN XL) 150 MG 24 hr tablet Take 1 tablet (150 mg total) by mouth daily. X 1 week then 2 tab po qd thereafter  . [DISCONTINUED] diclofenac (VOLTAREN) 75 MG EC tablet Take 1 tablet (75 mg total) by mouth 2 (two) times daily.  . [DISCONTINUED] diclofenac (VOLTAREN) 75 MG EC tablet TAKE (1) TABLET BY MOUTH TWICE DAILY.  . [DISCONTINUED] HYDROcodone-acetaminophen (NORCO) 5-325 MG tablet Take 1 tablet by mouth every 6 (six) hours as needed for moderate pain. (Patient not taking: Reported on 08/27/2017)   No facility-administered encounter medications on file as of 10/10/2017.     ALLERGIES:  No Known Allergies   PHYSICAL EXAM:  ECOG Performance status: 0 - Asymptomatic   Vitals:   10/10/17 1009  BP: 120/66  Pulse: 91  Resp: 20  Temp: 99 F (37.2 C)  SpO2: 95%   Filed Weights   10/10/17 1009  Weight: 274 lb 6.4 oz (124.5 kg)    Physical Exam  Constitutional: He is oriented to person, place, and time and well-developed, well-nourished, and in no distress.  HENT:  Head: Normocephalic.  Mouth/Throat: Oropharynx is clear and moist. No oropharyngeal exudate.  Eyes: Conjunctivae are normal. Pupils are equal, round, and reactive to light. No scleral icterus.  Neck: Normal range of motion. Neck supple.    Cardiovascular: Normal rate and regular rhythm.  Pulmonary/Chest: Effort normal and breath sounds normal. No respiratory distress.  Abdominal: Soft. Bowel sounds are normal. There is no tenderness.  Musculoskeletal: Normal range of motion. He exhibits no edema.  Lymphadenopathy:    He has no cervical adenopathy.       Right: No supraclavicular adenopathy present.       Left: No supraclavicular adenopathy present.  Neurological: He is alert and oriented to person, place, and time. No cranial nerve deficit. Gait normal.  Skin: Skin is warm and dry. No rash noted.  Psychiatric: Mood, memory, affect and judgment normal.  Nursing note and vitals reviewed.    LABORATORY DATA:  I have reviewed the labs as listed.  CBC    Component Value Date/Time   WBC 10.9 (H) 05/05/2017 3825  RBC 4.87 05/05/2017 0910   HGB 15.0 05/05/2017 0910   HCT 44.4 05/05/2017 0910   PLT 258 05/05/2017 0910   MCV 91.2 05/05/2017 0910   MCH 30.8 05/05/2017 0910   MCHC 33.8 05/05/2017 0910   RDW 13.5 05/05/2017 0910   LYMPHSABS 3.0 05/05/2017 0910   MONOABS 0.9 05/05/2017 0910   EOSABS 0.7 05/05/2017 0910   BASOSABS 0.0 05/05/2017 0910   CMP Latest Ref Rng & Units 05/05/2017 04/19/2017 08/09/2016  Glucose 65 - 99 mg/dL 102(H) 144(H) 116(H)  BUN 6 - 20 mg/dL 11 12 12   Creatinine 0.61 - 1.24 mg/dL 0.76 0.81 0.86  Sodium 135 - 145 mmol/L 135 140 137  Potassium 3.5 - 5.1 mmol/L 4.0 3.4(L) 4.5  Chloride 101 - 111 mmol/L 101 105 106  CO2 22 - 32 mmol/L 25 26 26   Calcium 8.9 - 10.3 mg/dL 8.9 9.1 9.3  Total Protein 6.5 - 8.1 g/dL 6.7 6.7 6.5  Total Bilirubin 0.3 - 1.2 mg/dL 0.8 0.7 0.5  Alkaline Phos 38 - 126 U/L 68 74 71  AST 15 - 41 U/L 23 22 18   ALT 17 - 63 U/L 19 17 18    Results for DERRAL, COLUCCI (MRN [id number])   Ref. Range 10/10/2017 09:23  Prothrombin Time Latest Ref Range: 11.4 - 15.2 seconds 15.0  INR Unknown 1.19   PENDING LABS:    DIAGNOSTIC IMAGING:    PATHOLOGY:     ASSESSMENT &  PLAN:   Elevated prothrombin time (PT): -Very mildly elevated PT of 13.3 in 04/2017.  Patient was referred here prior to knee surgery last fall for concerns for intraoperative and postoperative bleeding complications.  Coagulation workup demonstrated him mildly prolonged PT of 15.5, INR 1.24.  PTT normal.  Fibrinogen normal.  Von Willebrand's panel was negative for von Willebrand's disease.  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  Per Dr. Laverle Patter previous recommendations and plan, bleeding is unlikely to be an issue for him with factor VII activity levels greater than 10%. -He had his knee surgery last fall with no reported bleeding complications.   -Repeat PT today has normalized and 15 secs;  INR 1.19. Clinically, he feels very well and denies any recent bleeding or abnormal bruising. -Patient is wishing to be released from cancer center follow-up at this time.  I think this is reasonable, given his previous workup has been negative, with the exception of mildly low factor VII activity levels.  Reviewed with him that it is not expected that these levels would calls abnormal breathing in the future.  Of course, he is welcome to return to the cancer center for additional evaluation with any recurrent or new problems.  He is enthusiastic about this plan.  Encouraged him to call with an E abnormal bleeding or bruising, and we could certainly see him again as needed.  I wished him well.   Health maintenance/Wellness:  -Sees his PCP regularly. States that he is up-to-date with PSA screening and vaccinations. He tells me he is due for colonoscopy with Dr. Roseanne Kaufman team with GI; "I'm avoiding that because I've had so many colonoscopies in the past and I hate them."  Encouraged him to maintain follow-up as directed.   -He is current, everyday smoker ~1-1.5 packs per day.  Has been smoking for ~40 years.  He would qualify for lung cancer screening with annual low-dose CT.  Shared this with him today and encouraged him to talk to his PCP  about his important screening tool used to detect lung cancer.   -Recommended continued follow-up with his PCP and other specialists as directed.      Dispo:  -No further follow-up needed at this time. Encouraged him to call us if he has any new or concerning symptoms, and we could see him back as needed.  He agreed with this plan. I wished him well.    All questions were answered to patient's stated satisfaction. Encouraged patient to call with any new concerns or questions.    Orders placed this encounter:  No orders of the defined types were placed in this encounter.     Mike Craze, NP [place] [phone]

## 2017-10-10 NOTE — Patient Instructions (Signed)
[place] at [facility] Discharge Instructions  You were seen today by Mike Craze, NP We have released you from the clinic. If you should need [name] in the future please give [name] a call.  See schedulers up front for appointments    Thank you for choosing [place] at [facility] to provide your oncology and hematology care.  To afford each patient quality time with our provider, please arrive at least 15 minutes before your scheduled appointment time.   If you have a lab appointment with the [facility] please come in thru the  Main Entrance and check in at the main information desk  You need to re-schedule your appointment should you arrive 10 or more minutes late.  We strive to give you quality time with our providers, and arriving late affects you and other patients whose appointments are after yours.  Also, if you no show three or more times for appointments you may be dismissed from the clinic at the providers discretion.     Again, thank you for choosing [facility].  Our hope is that these requests will decrease the amount of time that you wait before being seen by our physicians.       _____________________________________________________________  Should you have questions after your visit to [facility], please contact our office at [phone] between the hours of 8:30 a.m. and 4:30 p.m.  Voicemails left after 4:30 p.m. will not be returned until the following business day.  For prescription refill requests, have your pharmacy contact our office.       Resources For Cancer Patients and their Caregivers ? American Cancer Society: Can assist with transportation, wigs, general needs, runs Look Good Feel Better.        830 702 5181 ? Cancer Care: Provides financial assistance, online support groups, medication/co-pay assistance.  1-800-813-HOPE 385 854 9765) ? [place] Assists  [place] cancer patients and their families through emotional , educational and financial support.  281-747-0270 ? [place] DSS Where to apply for food stamps, Medicaid and utility assistance. (440)485-2939 ? [facility]: Transportation to medical appointments. 684-859-6343 ? Social Security Administration: May apply for disability if have a Stage IV cancer. 716-760-5192 (505)837-1944 ? [employer], Disability and Transit Services: Assists with nutrition, care and transit needs. [place] Support Programs:   > Cancer Support Group  2nd Tuesday of the month 1pm-2pm, Journey Room   > Creative Journey  3rd Tuesday of the month 1130am-1pm, Journey Room

## 2017-10-18 ENCOUNTER — Other Ambulatory Visit: Payer: Self-pay | Admitting: Family Medicine

## 2017-10-31 ENCOUNTER — Other Ambulatory Visit: Payer: Self-pay | Admitting: Orthopedic Surgery

## 2017-11-17 ENCOUNTER — Other Ambulatory Visit: Payer: Self-pay | Admitting: Family Medicine

## 2017-11-17 MED ORDER — BUPROPION HCL ER (XL) 300 MG PO TB24: 300.0000 mg | ORAL_TABLET | Freq: Every day | ORAL | 3 refills | Status: DC

## 2017-11-24 ENCOUNTER — Other Ambulatory Visit: Payer: 59

## 2017-11-24 DIAGNOSIS — E7800 Pure hypercholesterolemia, unspecified: Secondary | ICD-10-CM | POA: Diagnosis not present

## 2017-11-24 DIAGNOSIS — E119 Type 2 diabetes mellitus without complications: Secondary | ICD-10-CM | POA: Diagnosis not present

## 2017-11-24 DIAGNOSIS — I10 Essential (primary) hypertension: Secondary | ICD-10-CM

## 2017-11-24 LAB — LIPID PANEL
CHOLESTEROL: 141 mg/dL (ref <200)
HDL: 32 mg/dL — ABNORMAL LOW (ref >40)
LDL CHOLESTEROL (CALC): 71 mg/dL
Non-HDL Cholesterol (Calc): 109 mg/dL (calc) (ref <130)
TRIGLYCERIDES: 319 mg/dL — AB (ref <150)
Total CHOL/HDL Ratio: 4.4 (calc) (ref <5.0)

## 2017-11-24 LAB — CBC WITH DIFFERENTIAL/PLATELET
BASOS ABS: 95 {cells}/uL (ref 0–200)
Basophils Relative: 0.7 %
Eosinophils Absolute: 734 cells/uL — ABNORMAL HIGH (ref 15–500)
Eosinophils Relative: 5.4 %
HEMATOCRIT: 44.6 % (ref 38.5–50.0)
Hemoglobin: 15.2 g/dL (ref 13.2–17.1)
LYMPHS ABS: 3645 {cells}/uL (ref 850–3900)
MCH: 30.8 pg (ref 27.0–33.0)
MCHC: 34.1 g/dL (ref 32.0–36.0)
MCV: 90.3 fL (ref 80.0–100.0)
MPV: 10 fL (ref 7.5–12.5)
Monocytes Relative: 9.7 %
NEUTROS PCT: 57.4 %
Neutro Abs: 7806 cells/uL — ABNORMAL HIGH (ref 1500–7800)
Platelets: 341 10*3/uL (ref 140–400)
RBC: 4.94 10*6/uL (ref 4.20–5.80)
RDW: 12.6 % (ref 11.0–15.0)
Total Lymphocyte: 26.8 %
WBC: 13.6 10*3/uL — ABNORMAL HIGH (ref 3.8–10.8)
WBCMIX: 1319 {cells}/uL — AB (ref 200–950)

## 2017-11-24 LAB — COMPREHENSIVE METABOLIC PANEL
AG Ratio: 1.7 (calc) (ref 1.0–2.5)
ALKALINE PHOSPHATASE (APISO): 89 U/L (ref 40–115)
ALT: 13 U/L (ref 9–46)
AST: 15 U/L (ref 10–35)
Albumin: 4.1 g/dL (ref 3.6–5.1)
BILIRUBIN TOTAL: 0.5 mg/dL (ref 0.2–1.2)
BUN: 16 mg/dL (ref 7–25)
CALCIUM: 9.9 mg/dL (ref 8.6–10.3)
CO2: 27 mmol/L (ref 20–32)
Chloride: 101 mmol/L (ref 98–110)
Creat: 1.31 mg/dL (ref 0.70–1.33)
Globulin: 2.4 g/dL (calc) (ref 1.9–3.7)
Glucose, Bld: 84 mg/dL (ref 65–99)
Potassium: 4.4 mmol/L (ref 3.5–5.3)
Sodium: 139 mmol/L (ref 135–146)
Total Protein: 6.5 g/dL (ref 6.1–8.1)

## 2017-11-25 ENCOUNTER — Encounter (INDEPENDENT_AMBULATORY_CARE_PROVIDER_SITE_OTHER): Payer: Self-pay

## 2017-11-25 LAB — HEMOGLOBIN A1C
EAG (MMOL/L): 7.7 (calc)
HEMOGLOBIN A1C: 6.5 %{Hb} — AB (ref <5.7)
Mean Plasma Glucose: 140 (calc)

## 2017-11-27 ENCOUNTER — Encounter: Payer: Self-pay | Admitting: Family Medicine

## 2017-12-11 ENCOUNTER — Encounter: Payer: Self-pay | Admitting: Family Medicine

## 2017-12-11 ENCOUNTER — Ambulatory Visit (INDEPENDENT_AMBULATORY_CARE_PROVIDER_SITE_OTHER): Payer: 59 | Admitting: Family Medicine

## 2017-12-11 VITALS — BP 110/72 | HR 114 | Temp 98.3°F | Resp 18 | Ht 69.0 in | Wt 275.0 lb

## 2017-12-11 DIAGNOSIS — E7800 Pure hypercholesterolemia, unspecified: Secondary | ICD-10-CM

## 2017-12-11 DIAGNOSIS — E119 Type 2 diabetes mellitus without complications: Secondary | ICD-10-CM | POA: Diagnosis not present

## 2017-12-11 DIAGNOSIS — F17200 Nicotine dependence, unspecified, uncomplicated: Secondary | ICD-10-CM | POA: Diagnosis not present

## 2017-12-11 DIAGNOSIS — I10 Essential (primary) hypertension: Secondary | ICD-10-CM

## 2017-12-11 DIAGNOSIS — Z9889 Other specified postprocedural states: Secondary | ICD-10-CM | POA: Diagnosis not present

## 2017-12-11 MED ORDER — DICLOFENAC SODIUM 75 MG PO TBEC: DELAYED_RELEASE_TABLET | ORAL | 5 refills | Status: DC

## 2017-12-11 MED ORDER — ALBUTEROL SULFATE HFA 108 (90 BASE) MCG/ACT IN AERS: 2.0000 | INHALATION_SPRAY | Freq: Four times a day (QID) | RESPIRATORY_TRACT | 0 refills | Status: DC | PRN

## 2017-12-12 NOTE — Progress Notes (Signed)
Subjective:    Patient ID: Jason French, male    DOB: [birth year], 57 y.o.   MRN: [id number]  Medication Refill     Patient is here today for follow-up of his chronic medical conditions.  His medical conditions include hypertension, hyperlipidemia, hypertriglyceridemia, and type 2 diabetes mellitus non-insulin-dependent.  He also has peripheral neuropathy in both legs as well as restless leg syndrome.  He also continues to smoke 1 pack of cigarettes per day.  He denies any chest pain shortness of breath or dyspnea on exertion.  He denies any myalgias or right upper quadrant pain.  He denies any polyuria, polydipsia, or blurry vision.  Patient underwent knee arthroscopy in November 2018 for meniscal tear.  Despite undergoing surgical correction for this, he continues to have chronic pain in his knee.  His orthopedist is recommended a knee replacement.  He is requesting to discuss with another orthopedist to get a second opinion prior to undergoing surgery.  While he is very comfortable with his orthopedist, he is very hesitant to pursue surgery and would like a second opinion prior to proceeding.  His most recent lab work as listed below.  Labs are significant for a hemoglobin A1c of 6.5 which is excellent, and LDL cholesterol well below 100 which is outstanding, however a triglyceride level greater than 300.  Patient does not eat breakfast.  He eats cheese crackers throughout the day and junk food while working.  At night he tends to eat meals that are high in protein saturated fat and carbohydrates.  He seldom eats vegetables or fruits.  For instance, when I asked him to name his last 3 meals, he mentions pizza, steak, and hamburger. Lab on 11/24/2017  Component Date Value Ref Range Status  . Hgb A1c MFr Bld 11/24/2017 6.5* <5.7 % of total Hgb Final   Comment: For someone without known diabetes, a hemoglobin A1c value of 6.5% or greater indicates that they may have  diabetes and this should be  confirmed with a follow-up  test. . For someone with known diabetes, a value <7% indicates  that their diabetes is well controlled and a value  greater than or equal to 7% indicates suboptimal  control. A1c targets should be individualized based on  duration of diabetes, age, comorbid conditions, and  other considerations. . Currently, no consensus exists regarding use of hemoglobin A1c for diagnosis of diabetes for children. .   . Mean Plasma Glucose 11/24/2017 140  (calc) Final  . eAG (mmol/L) 11/24/2017 7.7  (calc) Final  . WBC 11/24/2017 13.6* 3.8 - 10.8 Thousand/uL Final  . RBC 11/24/2017 4.94  4.20 - 5.80 Million/uL Final  . Hemoglobin 11/24/2017 15.2  13.2 - 17.1 g/dL Final  . HCT 11/24/2017 44.6  38.5 - 50.0 % Final  . MCV 11/24/2017 90.3  80.0 - 100.0 fL Final  . MCH 11/24/2017 30.8  27.0 - 33.0 pg Final  . MCHC 11/24/2017 34.1  32.0 - 36.0 g/dL Final  . RDW 11/24/2017 12.6  11.0 - 15.0 % Final  . Platelets 11/24/2017 341  140 - 400 Thousand/uL Final  . MPV 11/24/2017 10.0  7.5 - 12.5 fL Final  . Neutro Abs 11/24/2017 7,806* 1,500 - 7,800 cells/uL Final  . Lymphs Abs 11/24/2017 3,645  850 - 3,900 cells/uL Final  . WBC mixed population 11/24/2017 1,319* 200 - 950 cells/uL Final  . Eosinophils Absolute 11/24/2017 734* 15 - 500 cells/uL Final  . Basophils Absolute 11/24/2017 95  0 - 200  cells/uL Final  . Neutrophils Relative % 11/24/2017 57.4  % Final  . Total Lymphocyte 11/24/2017 26.8  % Final  . Monocytes Relative 11/24/2017 9.7  % Final  . Eosinophils Relative 11/24/2017 5.4  % Final  . Basophils Relative 11/24/2017 0.7  % Final  . Glucose, Bld 11/24/2017 84  65 - 99 mg/dL Final   Comment: .            Fasting reference interval .   . BUN 11/24/2017 16  7 - 25 mg/dL Final  . Creat 11/24/2017 1.31  0.70 - 1.33 mg/dL Final   Comment: For patients >92 years of age, the reference limit for Creatinine is approximately 13% higher for people identified as  African-American. .   Havery Moros Ratio 70/78/6754 NOT APPLICABLE  6 - 22 (calc) Final  . Sodium 11/24/2017 139  135 - 146 mmol/L Final  . Potassium 11/24/2017 4.4  3.5 - 5.3 mmol/L Final  . Chloride 11/24/2017 101  98 - 110 mmol/L Final  . CO2 11/24/2017 27  20 - 32 mmol/L Final  . Calcium 11/24/2017 9.9  8.6 - 10.3 mg/dL Final  . Total Protein 11/24/2017 6.5  6.1 - 8.1 g/dL Final  . Albumin 11/24/2017 4.1  3.6 - 5.1 g/dL Final  . Globulin 11/24/2017 2.4  1.9 - 3.7 g/dL (calc) Final  . AG Ratio 11/24/2017 1.7  1.0 - 2.5 (calc) Final  . Total Bilirubin 11/24/2017 0.5  0.2 - 1.2 mg/dL Final  . Alkaline phosphatase (APISO) 11/24/2017 89  40 - 115 U/L Final  . AST 11/24/2017 15  10 - 35 U/L Final  . ALT 11/24/2017 13  9 - 46 U/L Final  . Cholesterol 11/24/2017 141  <200 mg/dL Final  . HDL 11/24/2017 32* >40 mg/dL Final  . Triglycerides 11/24/2017 319* <150 mg/dL Final  . LDL Cholesterol (Calc) 11/24/2017 71  mg/dL (calc) Final   Comment: Reference range: <100 . Desirable range <100 mg/dL for primary prevention;   <70 mg/dL for patients with CHD or diabetic patients  with > or = 2 CHD risk factors. Marland Kitchen LDL-C is now calculated using the Martin-Hopkins  calculation, which is a validated novel method providing  better accuracy than the Friedewald equation in the  estimation of LDL-C.  Cresenciano Genre et al. Annamaria Helling. 4920;100(71): 2061-2068  (http://education.[employer].com/faq/FAQ164)   . Total CHOL/HDL Ratio 11/24/2017 4.4  <5.0 (calc) Final  . Non-HDL Cholesterol (Calc) 11/24/2017 109  <130 mg/dL (calc) Final   Comment: For patients with diabetes plus 1 major ASCVD risk  factor, treating to a non-HDL-C goal of <100 mg/dL  (LDL-C of <70 mg/dL) is considered a therapeutic  option.     Past Medical History:  Diagnosis Date  . Arthritis   . Cancer [facility])    adrenal cancer  . Diabetes mellitus without complication ([place])   . Diverticulitis   . Hyperlipidemia   . Hypertension     . Neuropathy   . RLS (restless legs syndrome)   . Sleep apnea    Past Surgical History:  Procedure Laterality Date  . COLON SURGERY     ruptured diverticulosis  . KNEE ARTHROSCOPY WITH MEDIAL MENISECTOMY Right 06/12/2017   Procedure: KNEE ARTHROSCOPY WITH PARTIAL MEDIAL MENISECTOMY;  Surgeon: [name], MD;  Location: [facility];  Service: Orthopedics;  Laterality: Right;  . RESECTION OF ABDOMINAL MASS     adrenal mass right (cancer)   Current Outpatient Medications on File Prior to Visit  Medication Sig Dispense Refill  .  atorvastatin (LIPITOR) 10 MG tablet TAKE (1) TABLET BY MOUTH AT BEDTIME. 90 tablet 1  . benazepril (LOTENSIN) 20 MG tablet TAKE 2 TABLETS DAILY. 60 tablet 2  . buPROPion (WELLBUTRIN XL) 300 MG 24 hr tablet Take 1 tablet (300 mg total) by mouth daily. 90 tablet 3  . gabapentin (NEURONTIN) 300 MG capsule TAKE 2 CAPSULES BY MOUTH THREE TIMES A DAY. 180 capsule 5  . hydrochlorothiazide (HYDRODIURIL) 25 MG tablet Take 1 tablet (25 mg total) by mouth daily. 90 tablet 3  . metFORMIN (GLUCOPHAGE) 1000 MG tablet TAKE (1) TABLET BY MOUTH TWICE A DAY WITH A MEAL. 180 tablet 1  . rOPINIRole (REQUIP) 1 MG tablet TAKE (2) TABLETS BY MOUTH AT BEDTIME. 180 tablet 2   No current facility-administered medications on file prior to visit.    No Known Allergies Social History   Socioeconomic History  . Marital status: Married    Spouse name: Not on file  . Number of children: Not on file  . Years of education: Not on file  . Highest education level: Not on file  Occupational History  . Not on file  Social Needs  . Financial resource strain: Not on file  . Food insecurity:    Worry: Not on file    Inability: Not on file  . Transportation needs:    Medical: Not on file    Non-medical: Not on file  Tobacco Use  . Smoking status: Current Every Day Smoker    Packs/day: 0.50    Years: 40.00    Pack years: 20.00    Types: Cigarettes  . Smokeless tobacco: Never Used   Substance and Sexual Activity  . Alcohol use: Yes    Comment: social  . Drug use: No  . Sexual activity: Yes  Lifestyle  . Physical activity:    Days per week: Not on file    Minutes per session: Not on file  . Stress: Not on file  Relationships  . Social connections:    Talks on phone: Not on file    Gets together: Not on file    Attends religious service: Not on file    Active member of club or organization: Not on file    Attends meetings of clubs or organizations: Not on file    Relationship status: Not on file  . Intimate partner violence:    Fear of current or ex partner: Not on file    Emotionally abused: Not on file    Physically abused: Not on file    Forced sexual activity: Not on file  Other Topics Concern  . Not on file  Social History Narrative  . Not on file      Review of Systems  All other systems reviewed and are negative.      Objective:   Physical Exam  Constitutional: He is oriented to person, place, and time. He appears well-developed and well-nourished. No distress.  HENT:  Head: Normocephalic and atraumatic.  Right Ear: External ear normal.  Left Ear: External ear normal.  Nose: Nose normal.  Mouth/Throat: Oropharynx is clear and moist. No oropharyngeal exudate.  Eyes: Pupils are equal, round, and reactive to light. Conjunctivae and EOM are normal. Right eye exhibits no discharge. Left eye exhibits no discharge. No scleral icterus.  Neck: Normal range of motion. Neck supple. No JVD present. No tracheal deviation present. No thyromegaly present.  Cardiovascular: Normal rate, regular rhythm, normal heart sounds and intact distal pulses. Exam reveals no gallop and  no friction rub.  No murmur heard. Pulmonary/Chest: Effort normal and breath sounds normal. No stridor. No respiratory distress. He has no wheezes. He has no rales. He exhibits no tenderness.  Abdominal: Soft. Bowel sounds are normal. He exhibits no distension and no mass. There is no  tenderness. There is no rebound and no guarding.  Musculoskeletal: Normal range of motion. He exhibits no edema, tenderness or deformity.  Lymphadenopathy:    He has no cervical adenopathy.  Neurological: He is alert and oriented to person, place, and time. He has normal reflexes. No cranial nerve deficit. He exhibits normal muscle tone. Coordination normal.  Skin: Skin is warm. No rash noted. He is not diaphoretic. No erythema. No pallor.  Psychiatric: He has a normal mood and affect. His behavior is normal. Judgment and thought content normal.  Vitals reviewed.         Assessment & Plan:  Controlled type 2 diabetes mellitus without complication, without long-term current use of insulin (HCC)  Benign essential HTN  Smoker  Pure hypercholesterolemia  S/P right knee arthroscopy 06/12/17 - Plan: Ambulatory referral to Orthopedic Surgery  I will happily consult orthopedic surgery for a second opinion.  Regarding his medical conditions, his blood pressure today is well controlled at 110/72.  His LDL cholesterol is excellent.  I am very happy with his hemoglobin A1c.  However his HDL cholesterol is extremely low his triglycerides are extremely high.  His weight is well above his goal weight and he continues to smoke.  These issues increases risk for cardiovascular disease.  Furthermore, I can hear faint wheezes on exam and I suspect that the patient may be starting to develop signs of emphysema.  I have strongly recommended smoking cessation.  Patient cannot tolerate Chantix and has seen no benefit on Wellbutrin.  I have recommended aggressive dietary changes.  I recommended a diet rich in fruits and vegetables.  I recommended discontinuation of junk food, reduction in protein and carbohydrates and saturated fat.  Patient also needs to engage in regular aerobic exercise ideally for 30 minutes a day 5 days a week.  I would encourage him to exercise in any way to his body will tolerate such as  swimming, elliptical machine, stationary bike.  I will make no changes in his medication at this time.  Follow-up in 6 months

## 2017-12-16 ENCOUNTER — Other Ambulatory Visit: Payer: Self-pay | Admitting: Family Medicine

## 2018-01-14 ENCOUNTER — Other Ambulatory Visit: Payer: Self-pay | Admitting: Family Medicine

## 2018-02-16 ENCOUNTER — Other Ambulatory Visit: Payer: Self-pay | Admitting: Family Medicine

## 2018-02-19 ENCOUNTER — Other Ambulatory Visit: Payer: Self-pay | Admitting: Family Medicine

## 2018-02-20 ENCOUNTER — Other Ambulatory Visit: Payer: Self-pay | Admitting: Family Medicine

## 2018-02-20 MED ORDER — METFORMIN HCL 1000 MG PO TABS: ORAL_TABLET | ORAL | 1 refills | Status: DC

## 2018-02-21 ENCOUNTER — Other Ambulatory Visit: Payer: Self-pay | Admitting: Family Medicine

## 2018-02-21 DIAGNOSIS — G629 Polyneuropathy, unspecified: Secondary | ICD-10-CM

## 2018-03-19 ENCOUNTER — Other Ambulatory Visit: Payer: Self-pay | Admitting: Family Medicine

## 2018-03-25 ENCOUNTER — Other Ambulatory Visit: Payer: Self-pay | Admitting: Family Medicine

## 2018-04-06 DIAGNOSIS — Z23 Encounter for immunization: Secondary | ICD-10-CM | POA: Diagnosis not present

## 2018-04-08 ENCOUNTER — Encounter: Payer: Self-pay | Admitting: Orthopedic Surgery

## 2018-04-13 ENCOUNTER — Ambulatory Visit: Payer: Self-pay | Admitting: Orthopedic Surgery

## 2018-04-17 ENCOUNTER — Other Ambulatory Visit: Payer: Self-pay | Admitting: Family Medicine

## 2018-04-20 DIAGNOSIS — M1712 Unilateral primary osteoarthritis, left knee: Secondary | ICD-10-CM | POA: Diagnosis not present

## 2018-04-20 DIAGNOSIS — M1711 Unilateral primary osteoarthritis, right knee: Secondary | ICD-10-CM | POA: Diagnosis not present

## 2018-04-24 ENCOUNTER — Other Ambulatory Visit: Payer: Self-pay | Admitting: Family Medicine

## 2018-05-14 ENCOUNTER — Encounter: Payer: Self-pay | Admitting: Family Medicine

## 2018-05-14 ENCOUNTER — Ambulatory Visit (INDEPENDENT_AMBULATORY_CARE_PROVIDER_SITE_OTHER): Payer: 59 | Admitting: Family Medicine

## 2018-05-14 ENCOUNTER — Ambulatory Visit
Admission: RE | Admit: 2018-05-14 | Discharge: 2018-05-14 | Disposition: A | Discharge status: Home / Self Care | Payer: 59 | Source: Ambulatory Visit | Attending: Family Medicine | Admitting: Family Medicine

## 2018-05-14 VITALS — BP 110/78 | HR 96 | Temp 97.9°F | Resp 18 | Ht 69.0 in | Wt 270.0 lb

## 2018-05-14 DIAGNOSIS — E118 Type 2 diabetes mellitus with unspecified complications: Secondary | ICD-10-CM

## 2018-05-14 DIAGNOSIS — F17200 Nicotine dependence, unspecified, uncomplicated: Secondary | ICD-10-CM

## 2018-05-14 DIAGNOSIS — I10 Essential (primary) hypertension: Secondary | ICD-10-CM | POA: Diagnosis not present

## 2018-05-14 DIAGNOSIS — Z125 Encounter for screening for malignant neoplasm of prostate: Secondary | ICD-10-CM | POA: Diagnosis not present

## 2018-05-14 DIAGNOSIS — R0989 Other specified symptoms and signs involving the circulatory and respiratory systems: Secondary | ICD-10-CM

## 2018-05-14 DIAGNOSIS — G4733 Obstructive sleep apnea (adult) (pediatric): Secondary | ICD-10-CM | POA: Diagnosis not present

## 2018-05-14 DIAGNOSIS — E7800 Pure hypercholesterolemia, unspecified: Secondary | ICD-10-CM

## 2018-05-14 DIAGNOSIS — R05 Cough: Secondary | ICD-10-CM | POA: Diagnosis not present

## 2018-05-14 NOTE — Progress Notes (Signed)
Subjective:    Patient ID: Jason French, male    DOB: [birth year], 57 y.o.   MRN: [id number]  Medication Refill    11/2017 Patient is here today for follow-up of his chronic medical conditions.  His medical conditions include hypertension, hyperlipidemia, hypertriglyceridemia, and type 2 diabetes mellitus non-insulin-dependent.  He also has peripheral neuropathy in both legs as well as restless leg syndrome.  He also continues to smoke 1 pack of cigarettes per day.  He denies any chest pain shortness of breath or dyspnea on exertion.  He denies any myalgias or right upper quadrant pain.  He denies any polyuria, polydipsia, or blurry vision.  Patient underwent knee arthroscopy in November 2018 for meniscal tear.  Despite undergoing surgical correction for this, he continues to have chronic pain in his knee.  His orthopedist is recommended a knee replacement.  He is requesting to discuss with another orthopedist to get a second opinion prior to undergoing surgery.  While he is very comfortable with his orthopedist, he is very hesitant to pursue surgery and would like a second opinion prior to proceeding.  His most recent lab work as listed below.  Labs are significant for a hemoglobin A1c of 6.5 which is excellent, and LDL cholesterol well below 100 which is outstanding, however a triglyceride level greater than 300.  Patient does not eat breakfast.  He eats cheese crackers throughout the day and junk food while working.  At night he tends to eat meals that are high in protein saturated fat and carbohydrates.  He seldom eats vegetables or fruits.  For instance, when I asked him to name his last 3 meals, he mentions pizza, steak, and hamburger.  At that time, my plan was: I will happily consult orthopedic surgery for a second opinion.  Regarding his medical conditions, his blood pressure today is well controlled at 110/72.  His LDL cholesterol is excellent.  I am very happy with his hemoglobin A1c.  However  his HDL cholesterol is extremely low his triglycerides are extremely high.  His weight is well above his goal weight and he continues to smoke.  These issues increases risk for cardiovascular disease.  Furthermore, I can hear faint wheezes on exam and I suspect that the patient may be starting to develop signs of emphysema.  I have strongly recommended smoking cessation.  Patient cannot tolerate Chantix and has seen no benefit on Wellbutrin.  I have recommended aggressive dietary changes.  I recommended a diet rich in fruits and vegetables.  I recommended discontinuation of junk food, reduction in protein and carbohydrates and saturated fat.  Patient also needs to engage in regular aerobic exercise ideally for 30 minutes a day 5 days a week.  I would encourage him to exercise in any way to his body will tolerate such as swimming, elliptical machine, stationary bike.  I will make no changes in his medication at this time.  Follow-up in 6 months  05/14/18 Patient is here today for surgical clearance for his upcoming knee surgery.  He states that his fasting blood sugars are averaging between 80 and 130.  He states that his 2-hour postprandial sugars are randomly checks but he has not seen anything over 200.  He denies any hypoglycemic episodes.  He denies any chest pain shortness of breath or dyspnea on exertion.  He denies any myalgias or right upper quadrant pain.  He has had a cough recently and on his physical exam, there are right basilar crackles.  He also smokes.  He denies any purulent sputum, chest pain or fevers.  Patient has a history of obstructive sleep apnea.  He is currently on CPAP at 14 cm water.  He states that he goes to bed every night at 930 and wakes up the following morning around 530.  He wears it every night throughout the night.  He is compliant.  He sees tremendous benefit from his CPAP machine.  However his machine is malfunctioning and he is requiring a new prescription.  His  respiratory therapist has recommended an auto titrate CPAP   Past Medical History:  Diagnosis Date  . Arthritis   . Cancer [facility])    adrenal cancer  . Diabetes mellitus without complication ([place])   . Diverticulitis   . Hyperlipidemia   . Hypertension   . Neuropathy   . RLS (restless legs syndrome)   . Sleep apnea    Past Surgical History:  Procedure Laterality Date  . COLON SURGERY     ruptured diverticulosis  . KNEE ARTHROSCOPY WITH MEDIAL MENISECTOMY Right 06/12/2017   Procedure: KNEE ARTHROSCOPY WITH PARTIAL MEDIAL MENISECTOMY;  Surgeon: [name], MD;  Location: [facility];  Service: Orthopedics;  Laterality: Right;  . RESECTION OF ABDOMINAL MASS     adrenal mass right (cancer)   Current Outpatient Medications on File Prior to Visit  Medication Sig Dispense Refill  . albuterol (PROVENTIL HFA;VENTOLIN HFA) 108 (90 Base) MCG/ACT inhaler Inhale 2 puffs into the lungs every 6 (six) hours as needed for wheezing or shortness of breath. 1 Inhaler 0  . atorvastatin (LIPITOR) 10 MG tablet TAKE (1) TABLET BY MOUTH AT BEDTIME. 30 tablet 0  . benazepril (LOTENSIN) 20 MG tablet TAKE 2 TABLETS DAILY. 60 tablet 2  . buPROPion (WELLBUTRIN XL) 300 MG 24 hr tablet Take 1 tablet (300 mg total) by mouth daily. 90 tablet 3  . diclofenac (VOLTAREN) 75 MG EC tablet TAKE (1) TABLET BY MOUTH TWICE DAILY. 60 tablet 5  . gabapentin (NEURONTIN) 300 MG capsule TAKE 2 CAPSULES BY MOUTH THREE TIMES A DAY. 180 capsule 3  . hydrochlorothiazide (HYDRODIURIL) 25 MG tablet Take 1 tablet (25 mg total) by mouth daily. 90 tablet 3  . metFORMIN (GLUCOPHAGE) 1000 MG tablet TAKE (1) TABLET BY MOUTH TWICE A DAY WITH A MEAL. 180 tablet 1  . rOPINIRole (REQUIP) 1 MG tablet TAKE (2) TABLETS BY MOUTH AT BEDTIME. 180 tablet 2   No current facility-administered medications on file prior to visit.    No Known Allergies Social History   Socioeconomic History  . Marital status: Married    Spouse name: Not on file    . Number of children: Not on file  . Years of education: Not on file  . Highest education level: Not on file  Occupational History  . Not on file  Social Needs  . Financial resource strain: Not on file  . Food insecurity:    Worry: Not on file    Inability: Not on file  . Transportation needs:    Medical: Not on file    Non-medical: Not on file  Tobacco Use  . Smoking status: Current Every Day Smoker    Packs/day: 0.50    Years: 40.00    Pack years: 20.00    Types: Cigarettes  . Smokeless tobacco: Never Used  Substance and Sexual Activity  . Alcohol use: Yes    Comment: social  . Drug use: No  . Sexual activity: Yes  Lifestyle  .  Physical activity:    Days per week: Not on file    Minutes per session: Not on file  . Stress: Not on file  Relationships  . Social connections:    Talks on phone: Not on file    Gets together: Not on file    Attends religious service: Not on file    Active member of club or organization: Not on file    Attends meetings of clubs or organizations: Not on file    Relationship status: Not on file  . Intimate partner violence:    Fear of current or ex partner: Not on file    Emotionally abused: Not on file    Physically abused: Not on file    Forced sexual activity: Not on file  Other Topics Concern  . Not on file  Social History Narrative  . Not on file      Review of Systems  All other systems reviewed and are negative.      Objective:   Physical Exam  Constitutional: He is oriented to person, place, and time. He appears well-developed and well-nourished. No distress.  HENT:  Head: Normocephalic and atraumatic.  Right Ear: External ear normal.  Left Ear: External ear normal.  Nose: Nose normal.  Mouth/Throat: Oropharynx is clear and moist. No oropharyngeal exudate.  Eyes: Pupils are equal, round, and reactive to light. Conjunctivae and EOM are normal. Right eye exhibits no discharge. Left eye exhibits no discharge. No scleral  icterus.  Neck: Normal range of motion. Neck supple. No JVD present. No tracheal deviation present. No thyromegaly present.  Cardiovascular: Normal rate, regular rhythm, normal heart sounds and intact distal pulses. Exam reveals no gallop and no friction rub.  No murmur heard. Pulmonary/Chest: Effort normal and breath sounds normal. No stridor. No respiratory distress. He has no wheezes. He has no rales. He exhibits no tenderness.  Abdominal: Soft. Bowel sounds are normal. He exhibits no distension and no mass. There is no tenderness. There is no rebound and no guarding.  Musculoskeletal: Normal range of motion. He exhibits no edema, tenderness or deformity.  Lymphadenopathy:    He has no cervical adenopathy.  Neurological: He is alert and oriented to person, place, and time. He has normal reflexes. No cranial nerve deficit. He exhibits normal muscle tone. Coordination normal.  Skin: Skin is warm. No rash noted. He is not diaphoretic. No erythema. No pallor.  Psychiatric: He has a normal mood and affect. His behavior is normal. Judgment and thought content normal.  Vitals reviewed.         Assessment & Plan:  Controlled type 2 diabetes mellitus with complication, without long-term current use of insulin ([place]) - Plan: CBC with Differential/Platelet, COMPLETE METABOLIC PANEL WITH GFR, Lipid panel, Hemoglobin A1c, Microalbumin, urine  Prostate cancer screening - Plan: PSA  Abnormal lung sounds - Plan: DG Chest 2 View  Benign essential HTN  Smoker  Pure hypercholesterolemia  Patient's blood pressures well controlled.  I recommended that he decrease hydrochlorothiazide to 12.5 mg a day due to relative hypotension.  I will check a CBC, and CMP for preoperative surgical clearance.  I will check a fasting lipid panel.  Goal LDL cholesterol is less than 100.  I will check a hemoglobin A1c.  Goal hemoglobin A1c is less than 6.5.  I will check for diabetic nephropathy with a urine microalbumin.   I recommended a colonoscopy but the patient defers that at the present time.  We did discuss Cologuard.  I  will screen for prostate cancer with a PSA.  Given his abnormal breath sounds and his smoking history, I did recommend a chest x-ray.  Barring any abnormalities on his lab work or x-ray he is cleared to proceed with surgery.  Patient needs CPAP auto titrate.  Please see the history of present illness which documents his compliance and benefit.  Continue to recommend diet exercise weight loss.

## 2018-05-15 ENCOUNTER — Other Ambulatory Visit: Payer: Self-pay | Admitting: *Deleted

## 2018-05-15 ENCOUNTER — Other Ambulatory Visit: Payer: Self-pay | Admitting: Family Medicine

## 2018-05-15 DIAGNOSIS — N189 Chronic kidney disease, unspecified: Secondary | ICD-10-CM

## 2018-05-15 LAB — CBC WITH DIFFERENTIAL/PLATELET
Basophils Absolute: 67 cells/uL (ref 0–200)
Basophils Relative: 0.7 %
Eosinophils Absolute: 624 cells/uL — ABNORMAL HIGH (ref 15–500)
Eosinophils Relative: 6.5 %
HCT: 41.9 % (ref 38.5–50.0)
Hemoglobin: 14.4 g/dL (ref 13.2–17.1)
Lymphs Abs: 3226 cells/uL (ref 850–3900)
MCH: 30.6 pg (ref 27.0–33.0)
MCHC: 34.4 g/dL (ref 32.0–36.0)
MCV: 89.1 fL (ref 80.0–100.0)
MPV: 9.7 fL (ref 7.5–12.5)
Monocytes Relative: 11.8 %
NEUTROS PCT: 47.4 %
Neutro Abs: 4550 cells/uL (ref 1500–7800)
PLATELETS: 321 10*3/uL (ref 140–400)
RBC: 4.7 10*6/uL (ref 4.20–5.80)
RDW: 12.7 % (ref 11.0–15.0)
TOTAL LYMPHOCYTE: 33.6 %
WBC: 9.6 10*3/uL (ref 3.8–10.8)
WBCMIX: 1133 {cells}/uL — AB (ref 200–950)

## 2018-05-15 LAB — COMPLETE METABOLIC PANEL WITH GFR
AG Ratio: 1.6 (calc) (ref 1.0–2.5)
ALBUMIN MSPROF: 4.1 g/dL (ref 3.6–5.1)
ALT: 20 U/L (ref 9–46)
AST: 17 U/L (ref 10–35)
Alkaline phosphatase (APISO): 74 U/L (ref 40–115)
BUN / CREAT RATIO: 11 (calc) (ref 6–22)
BUN: 17 mg/dL (ref 7–25)
CALCIUM: 9.3 mg/dL (ref 8.6–10.3)
CO2: 27 mmol/L (ref 20–32)
CREATININE: 1.51 mg/dL — AB (ref 0.70–1.33)
Chloride: 98 mmol/L (ref 98–110)
GFR, EST AFRICAN AMERICAN: 59 mL/min/{1.73_m2} — AB (ref >=60)
GFR, Est Non African American: 51 mL/min/{1.73_m2} — ABNORMAL LOW (ref >=60)
Globulin: 2.5 g/dL (calc) (ref 1.9–3.7)
Glucose, Bld: 89 mg/dL (ref 65–99)
Potassium: 4.9 mmol/L (ref 3.5–5.3)
Sodium: 136 mmol/L (ref 135–146)
TOTAL PROTEIN: 6.6 g/dL (ref 6.1–8.1)
Total Bilirubin: 0.7 mg/dL (ref 0.2–1.2)

## 2018-05-15 LAB — LIPID PANEL
CHOL/HDL RATIO: 4.6 (calc) (ref <5.0)
Cholesterol: 116 mg/dL (ref <200)
HDL: 25 mg/dL — ABNORMAL LOW (ref >40)
LDL CHOLESTEROL (CALC): 64 mg/dL
Non-HDL Cholesterol (Calc): 91 mg/dL (calc) (ref <130)
TRIGLYCERIDES: 205 mg/dL — AB (ref <150)

## 2018-05-15 LAB — HEMOGLOBIN A1C
HEMOGLOBIN A1C: 6.4 %{Hb} — AB (ref <5.7)
MEAN PLASMA GLUCOSE: 137 (calc)
eAG (mmol/L): 7.6 (calc)

## 2018-05-15 LAB — PSA: PSA: 1.2 ng/mL (ref <=4.0)

## 2018-05-15 LAB — MICROALBUMIN, URINE: Microalb, Ur: 1.7 mg/dL

## 2018-05-25 ENCOUNTER — Other Ambulatory Visit: Payer: Self-pay | Admitting: Family Medicine

## 2018-05-25 DIAGNOSIS — G629 Polyneuropathy, unspecified: Secondary | ICD-10-CM

## 2018-06-14 DIAGNOSIS — G4733 Obstructive sleep apnea (adult) (pediatric): Secondary | ICD-10-CM | POA: Diagnosis not present

## 2018-06-16 ENCOUNTER — Other Ambulatory Visit: Payer: Self-pay | Admitting: Family Medicine

## 2018-06-24 NOTE — Patient Instructions (Addendum)
SHAMAR ENGELMANN  06/24/2018   Your procedure is scheduled on: Monday 07/13/2018  Report to [facility] Main  Entrance              Report to admitting at  1010  AM    Call this number if you have problems the morning of surgery [phone]               Please bring CPAP mask and tubing the morning of surgery!  How to Manage Your Diabetes Before and After Surgery  Why is it important to control my blood sugar before and after surgery? . Improving blood sugar levels before and after surgery helps healing and can limit problems. . A way of improving blood sugar control is eating a healthy diet by: o  Eating less sugar and carbohydrates o  Increasing activity/exercise o  Talking with your doctor about reaching your blood sugar goals . High blood sugars (greater than 180 mg/dL) can raise your risk of infections and slow your recovery, so you will need to focus on controlling your diabetes during the weeks before surgery. . Make sure that the doctor who takes care of your diabetes knows about your planned surgery including the date and location.  How do I manage my blood sugar before surgery? . Check your blood sugar at least 4 times a day, starting 2 days before surgery, to make sure that the level is not too high or low. o Check your blood sugar the morning of your surgery when you wake up and every 2 hours until you get to the Short Stay unit. . If your blood sugar is less than 70 mg/dL, you will need to treat for low blood sugar: o Do not take insulin. o Treat a low blood sugar (less than 70 mg/dL) with  cup of clear juice (cranberry or apple), 4 glucose tablets, OR glucose gel. o Recheck blood sugar in 15 minutes after treatment (to make sure it is greater than 70 mg/dL). If your blood sugar is not greater than 70 mg/dL on recheck, call [phone] for further instructions. . Report your blood sugar to the short stay nurse when you get to Short  Stay.  . If you are admitted to the hospital after surgery: o Your blood sugar will be checked by the staff and you will probably be given insulin after surgery (instead of oral diabetes medicines) to make sure you have good blood sugar levels. o The goal for blood sugar control after surgery is 80-180 mg/dL.   WHAT DO I DO ABOUT MY DIABETES MEDICATION?        The day before surgery, Take Metformin as usual.  . Do not take oral diabetes medicines (pills) the morning of surgery.     Remember: Do not eat food or drink liquids :After Midnight.              BRUSH YOUR TEETH MORNING OF SURGERY AND RINSE YOUR MOUTH OUT, NO CHEWING GUM CANDY OR MINTS.     Take these medicines the morning of surgery with A SIP OF WATER: Gabapentin (Neurontin),  Use Albuterol inhaler as needed and bring inhaler with you to the hospital               DO NOT [place]  You may not have any metal on your body including hair pins and              piercings  Do not wear jewelry, make-up, lotions, powders or perfumes, deodorant                         Men may shave face and neck.   Do not bring valuables to the hospital. [place].  Contacts, dentures or bridgework may not be worn into surgery.  Leave suitcase in the car. After surgery it may be brought to your room.                  Please read over the following fact sheets you were given: _____________________________________________________________________             [facility] - Preparing for Surgery Before surgery, you can play an important role.  Because skin is not sterile, your skin needs to be as free of germs as possible.  You can reduce the number of germs on your skin by washing with CHG (chlorahexidine gluconate) soap before surgery.  CHG is an antiseptic cleaner which kills germs and bonds with the skin to continue  killing germs even after washing. Please DO NOT use if you have an allergy to CHG or antibacterial soaps.  If your skin becomes reddened/irritated stop using the CHG and inform your nurse when you arrive at Short Stay. Do not shave (including legs and underarms) for at least 48 hours prior to the first CHG shower.  You may shave your face/neck. Please follow these instructions carefully:  1.  Shower with CHG Soap the night before surgery and the  morning of Surgery.  2.  If you choose to wash your hair, wash your hair first as usual with your  normal  shampoo.  3.  After you shampoo, rinse your hair and body thoroughly to remove the  shampoo.                           4.  Use CHG as you would any other liquid soap.  You can apply chg directly  to the skin and wash                       Gently with a scrungie or clean washcloth.  5.  Apply the CHG Soap to your body ONLY FROM THE NECK DOWN.   Do not use on face/ open                           Wound or open sores. Avoid contact with eyes, ears mouth and genitals (private parts).                       Wash face,  Genitals (private parts) with your normal soap.             6.  Wash thoroughly, paying special attention to the area where your surgery  will be performed.  7.  Thoroughly rinse your body with warm water from the neck down.  8.  DO NOT shower/wash with your normal soap after using and rinsing off  the CHG Soap.  9.  Pat yourself dry with a clean towel.            10.  Wear clean pajamas.            11.  Place clean sheets on your bed the night of your first shower and do not  sleep with pets. Day of Surgery : Do not apply any lotions/deodorants the morning of surgery.  Please wear clean clothes to the hospital/surgery center.  FAILURE TO FOLLOW THESE INSTRUCTIONS MAY RESULT IN THE CANCELLATION OF YOUR SURGERY PATIENT SIGNATURE_________________________________  NURSE  SIGNATURE__________________________________  ________________________________________________________________________   Adam Phenix  An incentive spirometer is a tool that can help keep your lungs clear and active. This tool measures how well you are filling your lungs with each breath. Taking long deep breaths may help reverse or decrease the chance of developing breathing (pulmonary) problems (especially infection) following:  A long period of time when you are unable to move or be active. BEFORE THE PROCEDURE   If the spirometer includes an indicator to show your best effort, your nurse or respiratory therapist will set it to a desired goal.  If possible, sit up straight or lean slightly forward. Try not to slouch.  Hold the incentive spirometer in an upright position. INSTRUCTIONS FOR USE  1. Sit on the edge of your bed if possible, or sit up as far as you can in bed or on a chair. 2. Hold the incentive spirometer in an upright position. 3. Breathe out normally. 4. Place the mouthpiece in your mouth and seal your lips tightly around it. 5. Breathe in slowly and as deeply as possible, raising the piston or the ball toward the top of the column. 6. Hold your breath for 3-5 seconds or for as long as possible. Allow the piston or ball to fall to the bottom of the column. 7. Remove the mouthpiece from your mouth and breathe out normally. 8. Rest for a few seconds and repeat Steps 1 through 7 at least 10 times every 1-2 hours when you are awake. Take your time and take a few normal breaths between deep breaths. 9. The spirometer may include an indicator to show your best effort. Use the indicator as a goal to work toward during each repetition. 10. After each set of 10 deep breaths, practice coughing to be sure your lungs are clear. If you have an incision (the cut made at the time of surgery), support your incision when coughing by placing a pillow or rolled up towels firmly  against it. Once you are able to get out of bed, walk around indoors and cough well. You may stop using the incentive spirometer when instructed by your caregiver.  RISKS AND COMPLICATIONS  Take your time so you do not get dizzy or light-headed.  If you are in pain, you may need to take or ask for pain medication before doing incentive spirometry. It is harder to take a deep breath if you are having pain. AFTER USE  Rest and breathe slowly and easily.  It can be helpful to keep track of a log of your progress. Your caregiver can provide you with a simple table to help with this. If you are using the spirometer at home, follow these instructions: [place] IF:   You are having difficultly using the spirometer.  You have trouble using the spirometer as often as instructed.  Your pain medication is not giving enough relief while using the spirometer.  You  develop fever of 100.5 F (38.1 C) or higher. SEEK IMMEDIATE MEDICAL CARE IF:   You cough up bloody sputum that had not been present before.  You develop fever of 102 F (38.9 C) or greater.  You develop worsening pain at or near the incision site. MAKE SURE YOU:   Understand these instructions.  Will watch your condition.  Will get help right away if you are not doing well or get worse. Document Released: 11/25/2006 Document Revised: 10/07/2011 Document Reviewed: 01/26/2007 Exit[employer] Patient Information 2014 [employer], [place].   ________________________________________________________________________  WHAT IS A BLOOD TRANSFUSION? Blood Transfusion Information  A transfusion is the replacement of blood or some of its parts. Blood is made up of multiple cells which provide different functions.  Red blood cells carry oxygen and are used for blood loss replacement.  White blood cells fight against infection.  Platelets control bleeding.  Plasma helps clot blood.  Other blood products are available for  specialized needs, such as hemophilia or other clotting disorders. BEFORE THE TRANSFUSION  Who gives blood for transfusions?   Healthy volunteers who are fully evaluated to make sure their blood is safe. This is blood bank blood. Transfusion therapy is the safest it has ever been in the practice of medicine. Before blood is taken from a donor, a complete history is taken to make sure that person has no history of diseases nor engages in risky social behavior (examples are intravenous drug use or sexual activity with multiple partners). The donor's travel history is screened to minimize risk of transmitting infections, such as malaria. The donated blood is tested for signs of infectious diseases, such as HIV and hepatitis. The blood is then tested to be sure it is compatible with you in order to minimize the chance of a transfusion reaction. If you or a relative donates blood, this is often done in anticipation of surgery and is not appropriate for emergency situations. It takes many days to process the donated blood. RISKS AND COMPLICATIONS Although transfusion therapy is very safe and saves many lives, the main dangers of transfusion include:   Getting an infectious disease.  Developing a transfusion reaction. This is an allergic reaction to something in the blood you were given. Every precaution is taken to prevent this. The decision to have a blood transfusion has been considered carefully by your caregiver before blood is given. Blood is not given unless the benefits outweigh the risks. AFTER THE TRANSFUSION  Right after receiving a blood transfusion, you will usually feel much better and more energetic. This is especially true if your red blood cells have gotten low (anemic). The transfusion raises the level of the red blood cells which carry oxygen, and this usually causes an energy increase.  The nurse administering the transfusion will monitor you carefully for complications. HOME CARE  INSTRUCTIONS  No special instructions are needed after a transfusion. You may find your energy is better. Speak with your caregiver about any limitations on activity for underlying diseases you may have. SEEK MEDICAL CARE IF:   Your condition is not improving after your transfusion.  You develop redness or irritation at the intravenous (IV) site. SEEK IMMEDIATE MEDICAL CARE IF:  Any of the following symptoms occur over the next 12 hours:  Shaking chills.  You have a temperature by mouth above 102 F (38.9 C), not controlled by medicine.  Chest, back, or muscle pain.  People around you feel you are not acting correctly or are confused.  Shortness of  breath or difficulty breathing.  Dizziness and fainting.  You get a rash or develop hives.  You have a decrease in urine output.  Your urine turns a dark color or changes to pink, red, or brown. Any of the following symptoms occur over the next 10 days:  You have a temperature by mouth above 102 F (38.9 C), not controlled by medicine.  Shortness of breath.  Weakness after normal activity.  The white part of the eye turns yellow (jaundice).  You have a decrease in the amount of urine or are urinating less often.  Your urine turns a dark color or changes to pink, red, or brown. Document Released: 07/12/2000 Document Revised: 10/07/2011 Document Reviewed: 02/29/2008 [facility] Patient Information 2014 [place], [place].  _______________________________________________________________________

## 2018-06-24 NOTE — Progress Notes (Addendum)
05/14/2018- noted in Epic- CXR and lab- HgA1C  04/24/2018- Medical Clearance from Dr. Jenna Luo on chart

## 2018-07-07 ENCOUNTER — Encounter (HOSPITAL_COMMUNITY)
Admission: RE | Admit: 2018-07-07 | Discharge: 2018-07-07 | Disposition: A | Discharge status: Home / Self Care | Payer: 59 | Source: Ambulatory Visit | Attending: Orthopedic Surgery | Admitting: Orthopedic Surgery

## 2018-07-07 ENCOUNTER — Other Ambulatory Visit: Payer: Self-pay

## 2018-07-07 ENCOUNTER — Encounter (HOSPITAL_COMMUNITY): Payer: Self-pay

## 2018-07-07 DIAGNOSIS — M1711 Unilateral primary osteoarthritis, right knee: Secondary | ICD-10-CM | POA: Diagnosis not present

## 2018-07-07 DIAGNOSIS — Z01818 Encounter for other preprocedural examination: Secondary | ICD-10-CM | POA: Insufficient documentation

## 2018-07-07 DIAGNOSIS — E119 Type 2 diabetes mellitus without complications: Secondary | ICD-10-CM | POA: Insufficient documentation

## 2018-07-07 DIAGNOSIS — I10 Essential (primary) hypertension: Secondary | ICD-10-CM | POA: Insufficient documentation

## 2018-07-07 LAB — CBC
HCT: 43.9 % (ref 39.0–52.0)
Hemoglobin: 14.5 g/dL (ref 13.0–17.0)
MCH: 30.5 pg (ref 26.0–34.0)
MCHC: 33 g/dL (ref 30.0–36.0)
MCV: 92.4 fL (ref 80.0–100.0)
Platelets: 326 10*3/uL (ref 150–400)
RBC: 4.75 MIL/uL (ref 4.22–5.81)
RDW: 12.9 % (ref 11.5–15.5)
WBC: 11 10*3/uL — ABNORMAL HIGH (ref 4.0–10.5)
nRBC: 0 % (ref 0.0–0.2)

## 2018-07-07 LAB — SURGICAL PCR SCREEN
MRSA, PCR: NEGATIVE
Staphylococcus aureus: NEGATIVE

## 2018-07-07 LAB — APTT: aPTT: 31 seconds (ref 24–36)

## 2018-07-07 LAB — HEMOGLOBIN A1C
Hgb A1c MFr Bld: 6.1 % — ABNORMAL HIGH (ref 4.8–5.6)
MEAN PLASMA GLUCOSE: 128.37 mg/dL

## 2018-07-07 LAB — COMPREHENSIVE METABOLIC PANEL
ALK PHOS: 72 U/L (ref 38–126)
ALT: 19 U/L (ref 0–44)
AST: 20 U/L (ref 15–41)
Albumin: 4.1 g/dL (ref 3.5–5.0)
Anion gap: 7 (ref 5–15)
BUN: 19 mg/dL (ref 6–20)
CO2: 29 mmol/L (ref 22–32)
Calcium: 9.6 mg/dL (ref 8.9–10.3)
Chloride: 102 mmol/L (ref 98–111)
Creatinine, Ser: 1.27 mg/dL — ABNORMAL HIGH (ref 0.61–1.24)
GFR calc non Af Amer: >60 mL/min (ref >60)
Glucose, Bld: 97 mg/dL (ref 70–99)
Potassium: 4.2 mmol/L (ref 3.5–5.1)
Sodium: 138 mmol/L (ref 135–145)
Total Bilirubin: 0.9 mg/dL (ref 0.3–1.2)
Total Protein: 7 g/dL (ref 6.5–8.1)

## 2018-07-07 LAB — PROTIME-INR
INR: 1.23
Prothrombin Time: 15.4 seconds — ABNORMAL HIGH (ref 11.4–15.2)

## 2018-07-07 LAB — GLUCOSE, CAPILLARY: Glucose-Capillary: 142 mg/dL — ABNORMAL HIGH (ref 70–99)

## 2018-07-08 LAB — ABO/RH: ABO/RH(D): O POS

## 2018-07-10 NOTE — H&P (Signed)
TOTAL KNEE ADMISSION H&P  Patient is being admitted for right total knee arthroplasty.  Subjective:  Chief Complaint:right knee pain.  HPI: Jason French, 57 y.o. male, has a history of pain and functional disability in the right knee due to arthritis and has failed non-surgical conservative treatments for greater than 12 weeks to includeactivity modification.  Onset of symptoms was gradual, starting 2 years ago with gradually worsening course since that time. The patient noted prior procedures on the knee to include  menisectomy on the right knee(s).  Patient currently rates pain in the right knee(s) at 10 out of 10 with activity. Patient has worsening of pain with activity and weight bearing, crepitus and joint swelling.  Patient has evidence of significant medial joint space narrowing on the right where it is basically contacting bone-on-bone. He also has patellofemoral narrowing by imaging studies. There is no active infection.  Patient Active Problem List   Diagnosis Date Noted  . S/P right knee arthroscopy 06/12/17 06/16/2017  . Derangement of posterior horn of medial meniscus due to old tear or injury, right knee   . Primary osteoarthritis of left knee   . Prolonged pt (prothrombin time) 05/20/2017  . Abnormal bleeding time 05/05/2017  . HEMATOCHEZIA 03/09/2010  . DIVERTICULITIS, HX OF 03/09/2010  . HYPERLIPIDEMIA 02/28/2010  . HYPERTENSION 02/28/2010   Past Medical History:  Diagnosis Date  . Arthritis   . Cancer [facility])    adrenal cancer  . Diabetes mellitus without complication ([place])   . Diverticulitis   . Hyperlipidemia   . Hypertension   . Neuropathy   . RLS (restless legs syndrome)   . Sleep apnea    use CPAP    Past Surgical History:  Procedure Laterality Date  . COLON SURGERY     ruptured diverticulosis  . KNEE ARTHROSCOPY WITH MEDIAL MENISECTOMY Right 06/12/2017   Procedure: KNEE ARTHROSCOPY WITH PARTIAL MEDIAL MENISECTOMY;  Surgeon: [name], MD;   Location: [facility];  Service: Orthopedics;  Laterality: Right;  . RESECTION OF ABDOMINAL MASS     adrenal mass right (cancer)    No current facility-administered medications for this encounter.    Current Outpatient Medications  Medication Sig Dispense Refill Last Dose  . albuterol (PROVENTIL HFA;VENTOLIN HFA) 108 (90 Base) MCG/ACT inhaler Inhale 2 puffs into the lungs every 6 (six) hours as needed for wheezing or shortness of breath. 1 Inhaler 0 Taking  . atorvastatin (LIPITOR) 10 MG tablet TAKE (1) TABLET BY MOUTH AT BEDTIME. (Patient taking differently: Take 10 mg by mouth at bedtime. ) 30 tablet 2   . benazepril (LOTENSIN) 20 MG tablet TAKE 2 TABLETS DAILY. (Patient taking differently: Take 40 mg by mouth daily. ) 60 tablet 2 Taking  . diclofenac (VOLTAREN) 75 MG EC tablet TAKE (1) TABLET BY MOUTH TWICE DAILY. (Patient taking differently: Take 75 mg by mouth 2 (two) times daily. ) 60 tablet 5 Taking  . gabapentin (NEURONTIN) 300 MG capsule TAKE 2 CAPSULES BY MOUTH THREE TIMES A DAY. (Patient taking differently: Take 600 mg by mouth 3 (three) times daily. ) 180 capsule 0   . hydrochlorothiazide (HYDRODIURIL) 25 MG tablet Take 1 tablet (25 mg total) by mouth daily. 90 tablet 3 Taking  . metFORMIN (GLUCOPHAGE) 1000 MG tablet TAKE (1) TABLET BY MOUTH TWICE A DAY WITH A MEAL. (Patient taking differently: Take 1,000 mg by mouth 2 (two) times daily with a meal. ) 180 tablet 1 Taking  . rOPINIRole (REQUIP) 1 MG tablet TAKE (2)  TABLETS BY MOUTH AT BEDTIME. (Patient taking differently: Take 2 mg by mouth at bedtime. ) 60 tablet 3   . buPROPion (WELLBUTRIN XL) 300 MG 24 hr tablet Take 1 tablet (300 mg total) by mouth daily. (Patient not taking: Reported on 07/06/2018) 90 tablet 3 Not Taking at Unknown time   No Known Allergies  Social History   Tobacco Use  . Smoking status: Current Every Day Smoker    Packs/day: 0.50    Years: 40.00    Pack years: 20.00    Types: Cigarettes  . Smokeless tobacco:  Never Used  Substance Use Topics  . Alcohol use: Yes    Comment: social    Family History  Problem Relation Age of Onset  . Diverticulitis Mother   . Hypertension Mother   . Diverticulitis Father   . Cancer Father   . Hypertension Father      Review of Systems  Constitutional: Negative for chills and fever.  HENT: Negative for congestion, sore throat and tinnitus.   Eyes: Negative for double vision, photophobia and pain.  Respiratory: Negative for cough, shortness of breath and wheezing.   Cardiovascular: Negative for chest pain, palpitations and orthopnea.  Gastrointestinal: Negative for heartburn, nausea and vomiting.  Genitourinary: Negative for dysuria, frequency and urgency.  Musculoskeletal: Positive for joint pain.  Neurological: Negative for dizziness, weakness and headaches.    Objective:  Physical Exam  Well nourished and well developed. General: Alert and oriented x3, cooperative and pleasant, no acute distress. Head: normocephalic, atraumatic, neck supple. Eyes: EOMI. Respiratory: breath sounds clear in all fields, no wheezing, rales, or rhonchi. Cardiovascular: Regular rate and rhythm, no murmurs, gallops or rubs.  Abdomen: non-tender to palpation and soft, normoactive bowel sounds. Musculoskeletal: Antalgic gait without the use of assisted devices on the right.  Bilateral Hip Exam: ROM: Normal ROM without discomfort. There is no tenderness over the greater trochanter bursa. There is no pain on provocative testing of the hip. Right Knee Exam: Trace effusion. No Swelling. Range of motion is 5-125 degrees. Slight varus.  Crepitus on range of motion of the knee. Medial joint line tenderness. No lateral joint line tenderness.  Stable knee. Left Knee Exam: No effusion. No Swelling. Range of motion is 0-130 degrees. Crepitus on range of motion of the knee. No medial joint line tenderness. No lateral joint line tenderness. Stable knee. Calves soft and nontender. Motor  function intact in LE. Strength 5/5 LE bilaterally. Neuro: Distal pulses 2+. Sensation to light touch intact in LE.  Vital signs in last 24 hours:  Blood pressure: 128/88 mmHg Pulse: 88 bpm  Labs:   Estimated body mass index is 39.25 kg/m as calculated from the following:   Height as of 07/07/18: 5\' 9"  (1.753 m).   Weight as of 07/07/18: 120.6 kg.   Imaging Review Plain radiographs demonstrate severe degenerative joint disease of the right knee(s). The overall alignment isneutral. The bone quality appears to be adequate for age and reported activity level.   Preoperative templating of the joint replacement has been completed, documented, and submitted to the Operating Room personnel in order to optimize intra-operative equipment management.   Anticipated LOS equal to or greater than 2 midnights due to - Age 23 and older with one or more of the following:  - Obesity  - Expected need for hospital services (PT, OT, Nursing) required for safe  discharge  - Anticipated need for postoperative skilled nursing care or inpatient rehab  - Active co-morbidities: Diabetes OR   -  Unanticipated findings during/Post Surgery: None  - Patient is a high risk of re-admission due to: None     Assessment/Plan:  End stage arthritis, right knee   The patient history, physical examination, clinical judgment of the provider and imaging studies are consistent with end stage degenerative joint disease of the right knee(s) and total knee arthroplasty is deemed medically necessary. The treatment options including medical management, injection therapy arthroscopy and arthroplasty were discussed at length. The risks and benefits of total knee arthroplasty were presented and reviewed. The risks due to aseptic loosening, infection, stiffness, patella tracking problems, thromboembolic complications and other imponderables were discussed. The patient acknowledged the explanation, agreed to proceed with the  plan and consent was signed. Patient is being admitted for inpatient treatment for surgery, pain control, PT, OT, prophylactic antibiotics, VTE prophylaxis, progressive ambulation and ADL's and discharge planning. The patient is planning to be discharged home with outpatient physical therapy.   Therapy Plans: outpatient therapy at [facility] in [place] Disposition: Home with wife Planned DVT Prophylaxis: Xarelto 10 mg daily (hx of adrenal gland CA) DME needed: None PCP: Dr. Dennard Schaumann TXA: IV Allergies: NKDA Anesthesia Concerns: None Last HgbA1c: 6.1%  - Patient was instructed on what medications to stop prior to surgery. - Follow-up visit in 2 weeks with Dr. Wynelle Link - Begin physical therapy following surgery - Pre-operative lab work as pre-surgical testing - Prescriptions will be provided in hospital at time of discharge  Theresa Duty, PA-C Orthopedic Surgery [facility] [place]

## 2018-07-12 MED ORDER — DEXTROSE 5 % IV SOLN
3.0000 g | INTRAVENOUS | Status: AC
  Administered 2018-07-13: 3 g via INTRAVENOUS
  Filled 2018-07-12: qty 3

## 2018-07-12 MED ORDER — BUPIVACAINE LIPOSOME 1.3 % IJ SUSP
20.0000 mL | Freq: Once | INTRAMUSCULAR | Status: DC
  Filled 2018-07-12: qty 20

## 2018-07-13 ENCOUNTER — Encounter (HOSPITAL_COMMUNITY): Payer: Self-pay

## 2018-07-13 ENCOUNTER — Ambulatory Visit (HOSPITAL_COMMUNITY): Payer: 59 | Admitting: Anesthesiology

## 2018-07-13 ENCOUNTER — Other Ambulatory Visit: Payer: Self-pay

## 2018-07-13 ENCOUNTER — Encounter (HOSPITAL_COMMUNITY)
Admission: AD | Disposition: A | Discharge status: Home / Self Care | Payer: Self-pay | Source: Ambulatory Visit | Attending: Orthopedic Surgery

## 2018-07-13 ENCOUNTER — Observation Stay (HOSPITAL_COMMUNITY)
Admission: AD | Admit: 2018-07-13 | Discharge: 2018-07-15 | Disposition: A | Discharge status: Home / Self Care | Payer: 59 | Source: Ambulatory Visit | Attending: Orthopedic Surgery | Admitting: Orthopedic Surgery

## 2018-07-13 DIAGNOSIS — E785 Hyperlipidemia, unspecified: Secondary | ICD-10-CM | POA: Insufficient documentation

## 2018-07-13 DIAGNOSIS — Z7984 Long term (current) use of oral hypoglycemic drugs: Secondary | ICD-10-CM | POA: Diagnosis not present

## 2018-07-13 DIAGNOSIS — G8918 Other acute postprocedural pain: Secondary | ICD-10-CM | POA: Diagnosis not present

## 2018-07-13 DIAGNOSIS — M170 Bilateral primary osteoarthritis of knee: Principal | ICD-10-CM | POA: Insufficient documentation

## 2018-07-13 DIAGNOSIS — G2581 Restless legs syndrome: Secondary | ICD-10-CM | POA: Diagnosis not present

## 2018-07-13 DIAGNOSIS — E1140 Type 2 diabetes mellitus with diabetic neuropathy, unspecified: Secondary | ICD-10-CM | POA: Diagnosis not present

## 2018-07-13 DIAGNOSIS — M179 Osteoarthritis of knee, unspecified: Secondary | ICD-10-CM | POA: Diagnosis present

## 2018-07-13 DIAGNOSIS — I10 Essential (primary) hypertension: Secondary | ICD-10-CM | POA: Insufficient documentation

## 2018-07-13 DIAGNOSIS — M1711 Unilateral primary osteoarthritis, right knee: Secondary | ICD-10-CM | POA: Diagnosis not present

## 2018-07-13 DIAGNOSIS — M1710 Unilateral primary osteoarthritis, unspecified knee: Secondary | ICD-10-CM | POA: Diagnosis present

## 2018-07-13 DIAGNOSIS — M1712 Unilateral primary osteoarthritis, left knee: Secondary | ICD-10-CM | POA: Diagnosis present

## 2018-07-13 DIAGNOSIS — G4730 Sleep apnea, unspecified: Secondary | ICD-10-CM | POA: Diagnosis not present

## 2018-07-13 DIAGNOSIS — F17210 Nicotine dependence, cigarettes, uncomplicated: Secondary | ICD-10-CM | POA: Insufficient documentation

## 2018-07-13 DIAGNOSIS — Z79899 Other long term (current) drug therapy: Secondary | ICD-10-CM | POA: Diagnosis not present

## 2018-07-13 DIAGNOSIS — Z6838 Body mass index (BMI) 38.0-38.9, adult: Secondary | ICD-10-CM | POA: Insufficient documentation

## 2018-07-13 HISTORY — PX: TOTAL KNEE ARTHROPLASTY: SHX125

## 2018-07-13 LAB — TYPE AND SCREEN
ABO/RH(D): O POS
Antibody Screen: NEGATIVE

## 2018-07-13 LAB — GLUCOSE, CAPILLARY
GLUCOSE-CAPILLARY: 189 mg/dL — AB (ref 70–99)
Glucose-Capillary: 113 mg/dL — ABNORMAL HIGH (ref 70–99)
Glucose-Capillary: 107 mg/dL — ABNORMAL HIGH (ref 70–99)
Glucose-Capillary: 255 mg/dL — ABNORMAL HIGH (ref 70–99)

## 2018-07-13 SURGERY — ARTHROPLASTY, KNEE, TOTAL
Anesthesia: Spinal | Site: Knee | Laterality: Right

## 2018-07-13 MED ORDER — GABAPENTIN 300 MG PO CAPS: 300.0000 mg | ORAL_CAPSULE | Freq: Three times a day (TID) | ORAL | Status: DC

## 2018-07-13 MED ORDER — FENTANYL CITRATE (PF) 100 MCG/2ML IJ SOLN: 25.0000 ug | INTRAMUSCULAR | Status: DC | PRN

## 2018-07-13 MED ORDER — ACETAMINOPHEN 10 MG/ML IV SOLN
1000.0000 mg | Freq: Four times a day (QID) | INTRAVENOUS | Status: DC
  Administered 2018-07-13: 1000 mg via INTRAVENOUS
  Filled 2018-07-13: qty 100

## 2018-07-13 MED ORDER — ROPINIROLE HCL 1 MG PO TABS
2.0000 mg | ORAL_TABLET | Freq: Every day | ORAL | Status: DC
  Administered 2018-07-13 – 2018-07-14 (×2): 2 mg via ORAL
  Filled 2018-07-13 (×2): qty 2

## 2018-07-13 MED ORDER — INSULIN ASPART 100 UNIT/ML ~~LOC~~ SOLN
0.0000 [IU] | Freq: Three times a day (TID) | SUBCUTANEOUS | Status: DC
  Administered 2018-07-13: 3 [IU] via SUBCUTANEOUS
  Administered 2018-07-14 (×3): 2 [IU] via SUBCUTANEOUS

## 2018-07-13 MED ORDER — DIPHENHYDRAMINE HCL 12.5 MG/5ML PO ELIX: 12.5000 mg | ORAL_SOLUTION | ORAL | Status: DC | PRN

## 2018-07-13 MED ORDER — MENTHOL 3 MG MT LOZG
1.0000 | LOZENGE | OROMUCOSAL | Status: DC | PRN
  Filled 2018-07-13: qty 9

## 2018-07-13 MED ORDER — ROPIVACAINE HCL 7.5 MG/ML IJ SOLN
INTRAMUSCULAR | Status: DC | PRN
  Administered 2018-07-13: 20 mL via PERINEURAL

## 2018-07-13 MED ORDER — METHOCARBAMOL 500 MG IVPB - SIMPLE MED
500.0000 mg | Freq: Four times a day (QID) | INTRAVENOUS | Status: DC | PRN
  Administered 2018-07-13: 500 mg via INTRAVENOUS
  Filled 2018-07-13: qty 50

## 2018-07-13 MED ORDER — PROMETHAZINE HCL 25 MG/ML IJ SOLN: 6.2500 mg | INTRAMUSCULAR | Status: DC | PRN

## 2018-07-13 MED ORDER — ONDANSETRON HCL 4 MG/2ML IJ SOLN: 4.0000 mg | Freq: Four times a day (QID) | INTRAMUSCULAR | Status: DC | PRN

## 2018-07-13 MED ORDER — GABAPENTIN 300 MG PO CAPS
600.0000 mg | ORAL_CAPSULE | Freq: Three times a day (TID) | ORAL | Status: DC
  Administered 2018-07-13 – 2018-07-15 (×6): 600 mg via ORAL
  Filled 2018-07-13 (×7): qty 2

## 2018-07-13 MED ORDER — BISACODYL 10 MG RE SUPP: 10.0000 mg | Freq: Every day | RECTAL | Status: DC | PRN

## 2018-07-13 MED ORDER — HYDROCHLOROTHIAZIDE 25 MG PO TABS
25.0000 mg | ORAL_TABLET | Freq: Every day | ORAL | Status: DC
  Administered 2018-07-14 – 2018-07-15 (×2): 25 mg via ORAL
  Filled 2018-07-13 (×2): qty 1

## 2018-07-13 MED ORDER — DEXAMETHASONE SODIUM PHOSPHATE 10 MG/ML IJ SOLN
10.0000 mg | Freq: Once | INTRAMUSCULAR | Status: AC
  Administered 2018-07-14: 10 mg via INTRAVENOUS
  Filled 2018-07-13: qty 1

## 2018-07-13 MED ORDER — DEXAMETHASONE SODIUM PHOSPHATE 10 MG/ML IJ SOLN
8.0000 mg | Freq: Once | INTRAMUSCULAR | Status: AC
  Administered 2018-07-13: 10 mg via INTRAVENOUS

## 2018-07-13 MED ORDER — METHOCARBAMOL 500 MG IVPB - SIMPLE MED
INTRAVENOUS | Status: AC
  Filled 2018-07-13: qty 50

## 2018-07-13 MED ORDER — ONDANSETRON HCL 4 MG PO TABS: 4.0000 mg | ORAL_TABLET | Freq: Four times a day (QID) | ORAL | Status: DC | PRN

## 2018-07-13 MED ORDER — TRANEXAMIC ACID-NACL 1000-0.7 MG/100ML-% IV SOLN
1000.0000 mg | Freq: Once | INTRAVENOUS | Status: AC
  Administered 2018-07-13: 1000 mg via INTRAVENOUS
  Filled 2018-07-13: qty 100

## 2018-07-13 MED ORDER — ATORVASTATIN CALCIUM 10 MG PO TABS
10.0000 mg | ORAL_TABLET | Freq: Every day | ORAL | Status: DC
  Administered 2018-07-13 – 2018-07-14 (×2): 10 mg via ORAL
  Filled 2018-07-13 (×2): qty 1

## 2018-07-13 MED ORDER — LACTATED RINGERS IV SOLN
INTRAVENOUS | Status: DC
  Administered 2018-07-13: 1000 mL via INTRAVENOUS
  Administered 2018-07-13: 14:00:00 via INTRAVENOUS

## 2018-07-13 MED ORDER — GABAPENTIN 300 MG PO CAPS
300.0000 mg | ORAL_CAPSULE | Freq: Once | ORAL | Status: DC
  Filled 2018-07-13: qty 1

## 2018-07-13 MED ORDER — DOCUSATE SODIUM 100 MG PO CAPS
100.0000 mg | ORAL_CAPSULE | Freq: Two times a day (BID) | ORAL | Status: DC
  Administered 2018-07-13 – 2018-07-15 (×4): 100 mg via ORAL
  Filled 2018-07-13 (×4): qty 1

## 2018-07-13 MED ORDER — METHOCARBAMOL 500 MG PO TABS
500.0000 mg | ORAL_TABLET | Freq: Four times a day (QID) | ORAL | Status: DC | PRN
  Administered 2018-07-13 – 2018-07-15 (×5): 500 mg via ORAL
  Filled 2018-07-13 (×5): qty 1

## 2018-07-13 MED ORDER — METHYLPREDNISOLONE ACETATE 40 MG/ML IJ SUSP
INTRAMUSCULAR | Status: AC
  Filled 2018-07-13: qty 2

## 2018-07-13 MED ORDER — SODIUM CHLORIDE (PF) 0.9 % IJ SOLN
INTRAMUSCULAR | Status: AC
  Filled 2018-07-13: qty 50

## 2018-07-13 MED ORDER — METOCLOPRAMIDE HCL 5 MG/ML IJ SOLN: 5.0000 mg | Freq: Three times a day (TID) | INTRAMUSCULAR | Status: DC | PRN

## 2018-07-13 MED ORDER — POLYETHYLENE GLYCOL 3350 17 G PO PACK: 17.0000 g | PACK | Freq: Every day | ORAL | Status: DC | PRN

## 2018-07-13 MED ORDER — ACETAMINOPHEN 500 MG PO TABS
1000.0000 mg | ORAL_TABLET | Freq: Four times a day (QID) | ORAL | Status: AC
  Administered 2018-07-13 – 2018-07-14 (×4): 1000 mg via ORAL
  Filled 2018-07-13 (×4): qty 2

## 2018-07-13 MED ORDER — PROPOFOL 10 MG/ML IV BOLUS
INTRAVENOUS | Status: DC | PRN
  Administered 2018-07-13: 10 mg via INTRAVENOUS
  Administered 2018-07-13 (×3): 30 mg via INTRAVENOUS

## 2018-07-13 MED ORDER — BUPIVACAINE LIPOSOME 1.3 % IJ SUSP
INTRAMUSCULAR | Status: DC | PRN
  Administered 2018-07-13: 20 mL

## 2018-07-13 MED ORDER — SODIUM CHLORIDE 0.9 % IR SOLN
Status: DC | PRN
  Administered 2018-07-13: 1000 mL

## 2018-07-13 MED ORDER — TRANEXAMIC ACID-NACL 1000-0.7 MG/100ML-% IV SOLN
1000.0000 mg | INTRAVENOUS | Status: AC
  Administered 2018-07-13: 1000 mg via INTRAVENOUS
  Filled 2018-07-13: qty 100

## 2018-07-13 MED ORDER — OXYCODONE HCL 5 MG/5ML PO SOLN
5.0000 mg | Freq: Once | ORAL | Status: AC | PRN
  Filled 2018-07-13: qty 5

## 2018-07-13 MED ORDER — FLEET ENEMA 7-19 GM/118ML RE ENEM: 1.0000 | ENEMA | Freq: Once | RECTAL | Status: DC | PRN

## 2018-07-13 MED ORDER — PROPOFOL 10 MG/ML IV BOLUS
INTRAVENOUS | Status: AC
  Filled 2018-07-13: qty 60

## 2018-07-13 MED ORDER — SODIUM CHLORIDE 0.9 % IV SOLN
INTRAVENOUS | Status: DC
  Administered 2018-07-13 – 2018-07-14 (×2): via INTRAVENOUS

## 2018-07-13 MED ORDER — METOCLOPRAMIDE HCL 5 MG PO TABS: 5.0000 mg | ORAL_TABLET | Freq: Three times a day (TID) | ORAL | Status: DC | PRN

## 2018-07-13 MED ORDER — OXYCODONE HCL 5 MG PO TABS
5.0000 mg | ORAL_TABLET | Freq: Once | ORAL | Status: AC | PRN
  Administered 2018-07-13: 5 mg via ORAL

## 2018-07-13 MED ORDER — SODIUM CHLORIDE (PF) 0.9 % IJ SOLN
INTRAMUSCULAR | Status: AC
  Filled 2018-07-13: qty 10

## 2018-07-13 MED ORDER — OXYCODONE HCL 5 MG PO TABS
ORAL_TABLET | ORAL | Status: AC
  Filled 2018-07-13: qty 2

## 2018-07-13 MED ORDER — FENTANYL CITRATE (PF) 100 MCG/2ML IJ SOLN
50.0000 ug | INTRAMUSCULAR | Status: DC
  Administered 2018-07-13: 50 ug via INTRAVENOUS
  Filled 2018-07-13: qty 2

## 2018-07-13 MED ORDER — CHLORHEXIDINE GLUCONATE 4 % EX LIQD: 60.0000 mL | Freq: Once | CUTANEOUS | Status: DC

## 2018-07-13 MED ORDER — PHENOL 1.4 % MT LIQD: 1.0000 | OROMUCOSAL | Status: DC | PRN

## 2018-07-13 MED ORDER — OXYCODONE HCL 5 MG PO TABS
5.0000 mg | ORAL_TABLET | ORAL | Status: DC | PRN
  Administered 2018-07-13 – 2018-07-15 (×3): 10 mg via ORAL
  Filled 2018-07-13: qty 1
  Filled 2018-07-13 (×4): qty 2

## 2018-07-13 MED ORDER — MIDAZOLAM HCL 2 MG/2ML IJ SOLN
0.5000 mg | INTRAMUSCULAR | Status: DC
  Administered 2018-07-13: 2 mg via INTRAVENOUS
  Filled 2018-07-13: qty 2

## 2018-07-13 MED ORDER — SODIUM CHLORIDE (PF) 0.9 % IJ SOLN
INTRAMUSCULAR | Status: DC | PRN
  Administered 2018-07-13: 60 mL via INTRAVENOUS

## 2018-07-13 MED ORDER — CEFAZOLIN SODIUM-DEXTROSE 2-4 GM/100ML-% IV SOLN
2.0000 g | Freq: Four times a day (QID) | INTRAVENOUS | Status: AC
  Administered 2018-07-13 – 2018-07-14 (×2): 2 g via INTRAVENOUS
  Filled 2018-07-13 (×2): qty 100

## 2018-07-13 MED ORDER — ALBUTEROL SULFATE (2.5 MG/3ML) 0.083% IN NEBU: 2.5000 mg | INHALATION_SOLUTION | Freq: Four times a day (QID) | RESPIRATORY_TRACT | Status: DC | PRN

## 2018-07-13 MED ORDER — OXYCODONE HCL 5 MG PO TABS
10.0000 mg | ORAL_TABLET | ORAL | Status: DC | PRN
  Administered 2018-07-13: 10 mg via ORAL
  Administered 2018-07-14 (×3): 15 mg via ORAL
  Administered 2018-07-14: 10 mg via ORAL
  Administered 2018-07-15: 5 mg via ORAL
  Administered 2018-07-15: 10 mg via ORAL
  Filled 2018-07-13 (×4): qty 3
  Filled 2018-07-13: qty 2

## 2018-07-13 MED ORDER — PROPOFOL 500 MG/50ML IV EMUL
INTRAVENOUS | Status: DC | PRN
  Administered 2018-07-13: 125 ug/kg/min via INTRAVENOUS

## 2018-07-13 MED ORDER — BUPIVACAINE IN DEXTROSE 0.75-8.25 % IT SOLN
INTRATHECAL | Status: DC | PRN
  Administered 2018-07-13: 1.6 mL via INTRATHECAL

## 2018-07-13 MED ORDER — ASPIRIN EC 325 MG PO TBEC
325.0000 mg | DELAYED_RELEASE_TABLET | Freq: Two times a day (BID) | ORAL | Status: DC
  Administered 2018-07-14 – 2018-07-15 (×3): 325 mg via ORAL
  Filled 2018-07-13 (×3): qty 1

## 2018-07-13 MED ORDER — MORPHINE SULFATE (PF) 2 MG/ML IV SOLN: 1.0000 mg | INTRAVENOUS | Status: DC | PRN

## 2018-07-13 SURGICAL SUPPLY — 59 items
ATTUNE MED DOME PAT 38 KNEE (Knees) ×2 IMPLANT
ATTUNE PS FEM RT SZ 7 CEM KNEE (Femur) ×2 IMPLANT
ATTUNE PSRP INSR SZ7 7 KNEE (Insert) ×2 IMPLANT
BAG ZIPLOCK 12X15 (MISCELLANEOUS) ×2 IMPLANT
BANDAGE ACE 6X5 VEL STRL LF (GAUZE/BANDAGES/DRESSINGS) ×2 IMPLANT
BANDAGE ELASTIC 6 VELCRO ST LF (GAUZE/BANDAGES/DRESSINGS) ×2 IMPLANT
BASE TIBIAL ROT PLAT SZ 7 KNEE (Knees) ×1 IMPLANT
BLADE SAG 18X100X1.27 (BLADE) ×2 IMPLANT
BLADE SAW SGTL 11.0X1.19X90.0M (BLADE) ×2 IMPLANT
BLADE SURG SZ10 CARB STEEL (BLADE) ×4 IMPLANT
BOWL SMART MIX CTS (DISPOSABLE) ×2 IMPLANT
CEMENT HV SMART SET (Cement) ×4 IMPLANT
CLSR STERI-STRIP ANTIMIC 1/2X4 (GAUZE/BANDAGES/DRESSINGS) ×2 IMPLANT
COVER SURGICAL LIGHT HANDLE (MISCELLANEOUS) ×2 IMPLANT
COVER WAND RF STERILE (DRAPES) IMPLANT
CUFF TOURN SGL QUICK 34 (TOURNIQUET CUFF) ×1
CUFF TRNQT CYL 34X4X40X1 (TOURNIQUET CUFF) ×1 IMPLANT
DECANTER SPIKE VIAL GLASS SM (MISCELLANEOUS) ×2 IMPLANT
DRAPE U-SHAPE 47X51 STRL (DRAPES) ×2 IMPLANT
DRSG ADAPTIC 3X8 NADH LF (GAUZE/BANDAGES/DRESSINGS) ×2 IMPLANT
DRSG PAD ABDOMINAL 8X10 ST (GAUZE/BANDAGES/DRESSINGS) ×2 IMPLANT
DURAPREP 26ML APPLICATOR (WOUND CARE) ×2 IMPLANT
ELECT REM PT RETURN 15FT ADLT (MISCELLANEOUS) ×2 IMPLANT
EVACUATOR 1/8 PVC DRAIN (DRAIN) ×2 IMPLANT
GAUZE SPONGE 4X4 12PLY STRL (GAUZE/BANDAGES/DRESSINGS) ×2 IMPLANT
GLOVE BIO SURGEON STRL SZ7 (GLOVE) ×2 IMPLANT
GLOVE BIO SURGEON STRL SZ8 (GLOVE) ×2 IMPLANT
GLOVE BIOGEL PI IND STRL 6.5 (GLOVE) ×1 IMPLANT
GLOVE BIOGEL PI IND STRL 7.0 (GLOVE) ×1 IMPLANT
GLOVE BIOGEL PI IND STRL 8 (GLOVE) ×1 IMPLANT
GLOVE BIOGEL PI INDICATOR 6.5 (GLOVE) ×1
GLOVE BIOGEL PI INDICATOR 7.0 (GLOVE) ×1
GLOVE BIOGEL PI INDICATOR 8 (GLOVE) ×1
GLOVE SURG SS PI 6.5 STRL IVOR (GLOVE) ×2 IMPLANT
GOWN STRL REUS W/TWL LRG LVL3 (GOWN DISPOSABLE) ×4 IMPLANT
GOWN STRL REUS W/TWL XL LVL3 (GOWN DISPOSABLE) ×2 IMPLANT
HANDPIECE INTERPULSE COAX TIP (DISPOSABLE) ×1
HOLDER FOLEY CATH W/STRAP (MISCELLANEOUS) IMPLANT
IMMOBILIZER KNEE 20 (SOFTGOODS) ×2
IMMOBILIZER KNEE 20 THIGH 36 (SOFTGOODS) ×1 IMPLANT
MANIFOLD NEPTUNE II (INSTRUMENTS) ×2 IMPLANT
NS IRRIG 1000ML POUR BTL (IV SOLUTION) ×2 IMPLANT
PACK TOTAL KNEE CUSTOM (KITS) ×2 IMPLANT
PAD ABD 7.5X8 STRL (GAUZE/BANDAGES/DRESSINGS) ×2 IMPLANT
PADDING CAST COTTON 6X4 STRL (CAST SUPPLIES) ×6 IMPLANT
PIN STEINMAN FIXATION KNEE (PIN) ×2 IMPLANT
PROTECTOR NERVE ULNAR (MISCELLANEOUS) ×2 IMPLANT
SET HNDPC FAN SPRY TIP SCT (DISPOSABLE) ×1 IMPLANT
STRIP CLOSURE SKIN 1/2X4 (GAUZE/BANDAGES/DRESSINGS) ×4 IMPLANT
SUT MNCRL AB 4-0 PS2 18 (SUTURE) ×2 IMPLANT
SUT STRATAFIX 0 PDS 27 VIOLET (SUTURE) ×2
SUT VIC AB 2-0 CT1 27 (SUTURE) ×3
SUT VIC AB 2-0 CT1 TAPERPNT 27 (SUTURE) ×3 IMPLANT
SUTURE STRATFX 0 PDS 27 VIOLET (SUTURE) ×1 IMPLANT
TIBIAL BASE ROT PLAT SZ 7 KNEE (Knees) ×2 IMPLANT
TRAY FOLEY MTR SLVR 16FR STAT (SET/KITS/TRAYS/PACK) ×2 IMPLANT
WATER STERILE IRR 1000ML POUR (IV SOLUTION) ×4 IMPLANT
WRAP KNEE MAXI GEL POST OP (GAUZE/BANDAGES/DRESSINGS) ×2 IMPLANT
YANKAUER SUCT BULB TIP 10FT TU (MISCELLANEOUS) ×2 IMPLANT

## 2018-07-13 NOTE — Anesthesia Procedure Notes (Signed)
Spinal  Patient location during procedure: OR Start time: 07/13/2018 12:58 PM End time: 07/13/2018 1:07 PM Staffing Anesthesiologist: Audry Pili, MD Performed: anesthesiologist  Preanesthetic Checklist Completed: patient identified, surgical consent, pre-op evaluation, timeout performed, IV checked, risks and benefits discussed and monitors and equipment checked Spinal Block Patient position: sitting Prep: DuraPrep Patient monitoring: heart rate, cardiac monitor, continuous pulse ox and blood pressure Approach: midline Location: L2-3 Injection technique: single-shot Needle Needle type: Quincke  Needle gauge: 22 G Needle length: 9 cm Additional Notes Consent was obtained prior to the procedure with all questions answered and concerns addressed. Risks including, but not limited to, bleeding, infection, nerve damage, paralysis, failed block, inadequate analgesia, allergic reaction, high spinal, itching, and headache were discussed and the patient wished to proceed. Functioning IV was confirmed and monitors were applied. Sterile prep and drape, including hand hygiene, mask, and sterile gloves were used. The patient was positioned and the spine was prepped. The skin was anesthetized with lidocaine. Unsuccessful with Pencan needle. Able to locate CSF with longer 22ga Quincke. Free flow of clear CSF was obtained prior to injecting local anesthetic into the CSF. The spinal needle aspirated freely following injection. The needle was carefully withdrawn. The patient tolerated the procedure well.   Renold Don, MD

## 2018-07-13 NOTE — Progress Notes (Signed)
Assisted Dr. Brock with right, ultrasound guided, adductor canal block. Side rails up, monitors on throughout procedure. See vital signs in flow sheet. Tolerated Procedure well.  

## 2018-07-13 NOTE — Anesthesia Procedure Notes (Signed)
Anesthesia Regional Block: Adductor canal block   Pre-Anesthetic Checklist: ,, timeout performed, Correct Patient, Correct Site, Correct Laterality, Correct Procedure, Correct Position, site marked, Risks and benefits discussed,  Surgical consent,  Pre-op evaluation,  At surgeon's request and post-op pain management  Laterality: Right  Prep: chloraprep       Needles:  Injection technique: Single-shot  Needle Type: Echogenic Needle     Needle Length: 10cm  Needle Gauge: 21     Additional Needles:   Narrative:  Start time: 07/13/2018 12:16 PM End time: 07/13/2018 12:20 PM Injection made incrementally with aspirations every 5 mL.  Performed by: Personally  Anesthesiologist: Audry Pili, MD  Additional Notes: No pain on injection. No increased resistance to injection. Injection made in 5cc increments. Good needle visualization. Patient tolerated the procedure well.

## 2018-07-13 NOTE — Interval H&P Note (Signed)
History and Physical Interval Note:  07/13/2018 10:52 AM  Jason French  has presented today for surgery, with the diagnosis of right knee osteoarthritis  The various methods of treatment have been discussed with the patient and family. After consideration of risks, benefits and other options for treatment, the patient has consented to  Procedure(s) with comments: RIGHT TOTAL KNEE ARTHROPLASTY (Right) - 95min as a surgical intervention .  The patient's history has been reviewed, patient examined, no change in status, stable for surgery.  I have reviewed the patient's chart and labs.  Questions were answered to the patient's satisfaction.     Pilar Plate Remmi Armenteros

## 2018-07-13 NOTE — Progress Notes (Signed)
PT Cancellation Note  Patient Details Name: SAMIER JACO MRN: [id number] DOB: 12-Nov-1960   Cancelled Treatment:    Reason Eval/Treat Not Completed: Medical issues which prohibited therapy(spinal hasn't worn off completely, will follow. )  Philomena Doheny PT 07/13/2018  Acute Rehabilitation Services Pager [phone] Office [phone]

## 2018-07-13 NOTE — Op Note (Signed)
OPERATIVE REPORT-TOTAL KNEE ARTHROPLASTY   Pre-operative diagnosis- Osteoarthritis  Right knee(s)   Osteoarthritis left knee  Post-operative diagnosis- Osteoarthritis Right knee(s)   Osteoarthritis left knee  Procedure-  Right  Total Knee Arthroplasty   2. Cortisone injection left knee  Surgeon- Dione Plover. Santasia Rew, MD  Assistant- Ardeen Jourdain, PA-C   Anesthesia-  Adductor canal block and spinal  EBL- 25 ml   Drains Hemovac  Tourniquet time-  Total Tourniquet Time Documented: Thigh (Right) - 34 minutes Total: Thigh (Right) - 34 minutes     Complications- None  Condition-PACU - hemodynamically stable.   Brief Clinical Note  Jason French is a 57 y.o. year old male with end stage OA of his right knee with progressively worsening pain and dysfunction. He has constant pain, with activity and at rest and significant functional deficits with difficulties even with ADLs. He has had extensive non-op management including analgesics, injections of cortisone and viscosupplements, and home exercise program, but remains in significant pain with significant dysfunction. Radiographs show bone on bone arthritis medial and patellofemoral. He presents now for right Total Knee Arthroplasty.  He also has symptomatic osteoarthritis of his left knee and requests cortisone injection  Procedure in detail---   The patient is brought into the operating room and positioned supine on the operating table. After successful administration of  Adductor canal block and spinal,   a tourniquet is placed high on the  Right thigh(s) and the lower extremity is prepped and draped in the usual sterile fashion. Time out is performed by the operating team and then the  Right lower extremity is wrapped in Esmarch, knee flexed and the tourniquet inflated to 300 mmHg.       A midline incision is made with a ten blade through the subcutaneous tissue to the level of the extensor mechanism. A fresh blade is used to make  a medial parapatellar arthrotomy. Soft tissue over the proximal medial tibia is subperiosteally elevated to the joint line with a knife and into the semimembranosus bursa with a Cobb elevator. Soft tissue over the proximal lateral tibia is elevated with attention being paid to avoiding the patellar tendon on the tibial tubercle. The patella is everted, knee flexed 90 degrees and the ACL and PCL are removed. Findings are bone on bone medial with exposed bone trochlea        The drill is used to create a starting hole in the distal femur and the canal is thoroughly irrigated with sterile saline to remove the fatty contents. The 5 degree Right  valgus alignment guide is placed into the femoral canal and the distal femoral cutting block is pinned to remove 9 mm off the distal femur. Resection is made with an oscillating saw.      The tibia is subluxed forward and the menisci are removed. The extramedullary alignment guide is placed referencing proximally at the medial aspect of the tibial tubercle and distally along the second metatarsal axis and tibial crest. The block is pinned to remove 78mm off the more deficient medial  side. Resection is made with an oscillating saw. Size 7is the most appropriate size for the tibia and the proximal tibia is prepared with the modular drill and keel punch for that size.      The femoral sizing guide is placed and size 7 is most appropriate. Rotation is marked off the epicondylar axis and confirmed by creating a rectangular flexion gap at 90 degrees. The size 7 cutting block is pinned  in this rotation and the anterior, posterior and chamfer cuts are made with the oscillating saw. The intercondylar block is then placed and that cut is made.      Trial size 7 tibial component, trial size 7 posterior stabilized femur and a 7  mm posterior stabilized rotating platform insert trial is placed. Full extension is achieved with excellent varus/valgus and anterior/posterior balance  throughout full range of motion. The patella is everted and thickness measured to be 24  mm. Free hand resection is taken to 14 mm, a 38 template is placed, lug holes are drilled, trial patella is placed, and it tracks normally. Osteophytes are removed off the posterior femur with the trial in place. All trials are removed and the cut bone surfaces prepared with pulsatile lavage. Cement is mixed and once ready for implantation, the size 7 tibial implant, size  7 posterior stabilized femoral component, and the size 38 patella are cemented in place and the patella is held with the clamp. The trial insert is placed and the knee held in full extension. The Exparel (20 ml mixed with 60 ml saline) is injected into the extensor mechanism, posterior capsule, medial and lateral gutters and subcutaneous tissues.  All extruded cement is removed and once the cement is hard the permanent 7 mm posterior stabilized rotating platform insert is placed into the tibial tray.      The wound is copiously irrigated with saline solution and the extensor mechanism closed over a hemovac drain with #1 V-loc suture. The tourniquet is released for a total tourniquet time of 34  minutes. Flexion against gravity is 140 degrees and the patella tracks normally. Subcutaneous tissue is closed with 2.0 vicryl and subcuticular with running 4.0 Monocryl. The incision is cleaned and dried and steri-strips and a bulky sterile dressing are applied. The limb is placed into a knee immobilizer.      I then prepped his left knee with Betadine and injected it with 80 mg (2 ml) Depomedrol with no problems.Tthe patient is then awakened and transported to recovery in stable condition.      Please note that a surgical assistant was a medical necessity for this procedure in order to perform it in a safe and expeditious manner. Surgical assistant was necessary to retract the ligaments and vital neurovascular structures to prevent injury to them and also necessary  for proper positioning of the limb to allow for anatomic placement of the prosthesis.   Dione Plover Margaree Sandhu, MD    07/13/2018, 2:03 PM

## 2018-07-13 NOTE — Anesthesia Preprocedure Evaluation (Addendum)
Anesthesia Evaluation    Reviewed: Allergy & Precautions, Patient's Chart, lab work & pertinent test results  History of Anesthesia Complications Negative for: history of anesthetic complications  Airway Mallampati: II  TM Distance: >3 FB Neck ROM: Full    Dental  (+) Dental Advisory Given   Pulmonary sleep apnea and Continuous Positive Airway Pressure Ventilation , Current Smoker,    breath sounds clear to auscultation       Cardiovascular hypertension, Pt. on medications (-) angina Rhythm:Regular Rate:Normal     Neuro/Psych  RLS   Neuromuscular disease (neuropathy) negative psych ROS   GI/Hepatic negative GI ROS, Neg liver ROS,   Endo/Other  diabetes, Type 2, Oral Hypoglycemic AgentsMorbid obesity Hx adrenal cancer s/p resection   Renal/GU Renal InsufficiencyRenal disease     Musculoskeletal  (+) Arthritis ,   Abdominal (+) + obese,   Peds  Hematology negative hematology ROS (+)   Anesthesia Other Findings   Reproductive/Obstetrics                            Anesthesia Physical Anesthesia Plan  ASA: III  Anesthesia Plan: Spinal   Post-op Pain Management:  Regional for Post-op pain   Induction:   PONV Risk Score and Plan: 1 and Treatment may vary due to age or medical condition and Propofol infusion  Airway Management Planned: Natural Airway and Simple Face Mask  Additional Equipment: None  Intra-op Plan:   Post-operative Plan:   Informed Consent: I have reviewed the patients History and Physical, chart, labs and discussed the procedure including the risks, benefits and alternatives for the proposed anesthesia with the patient or authorized representative who has indicated his/her understanding and acceptance.     Plan Discussed with: CRNA and Anesthesiologist  Anesthesia Plan Comments: (Labs reviewed, platelets acceptable. Discussed risks and benefits of spinal,  including spinal/epidural hematoma, infection, failed block, and PDPH. Patient expressed understanding and wished to proceed. )       Anesthesia Quick Evaluation

## 2018-07-13 NOTE — Discharge Instructions (Signed)
° °Dr. Frank Aluisio °Total Joint Specialist °[facility] °[address] °[place], [place] [address] °[phone] ° °TOTAL KNEE REPLACEMENT POSTOPERATIVE DIRECTIONS ° °Knee Rehabilitation, Guidelines Following Surgery  °Results after knee surgery are often greatly improved when you follow the exercise, range of motion and muscle strengthening exercises prescribed by your doctor. Safety measures are also important to protect the knee from further injury. Any time any of these exercises cause you to have increased pain or swelling in your knee joint, decrease the amount until you are comfortable again and slowly increase them. If you have problems or questions, call your caregiver or physical therapist for advice.  ° °HOME CARE INSTRUCTIONS  °• Remove items at home which could result in a fall. This includes throw rugs or furniture in walking pathways.  °· ICE to the affected knee every three hours for 30 minutes at a time and then as needed for pain and swelling.  Continue to use ice on the knee for pain and swelling from surgery. You may notice swelling that will progress down to the foot and ankle.  This is normal after surgery.  Elevate the leg when you are not up walking on it.   °· Continue to use the breathing machine which will help keep your temperature down.  It is common for your temperature to cycle up and down following surgery, especially at night when you are not up moving around and exerting yourself.  The breathing machine keeps your lungs expanded and your temperature down. °· Do not place pillow under knee, focus on keeping the knee straight while resting ° °DIET °You may resume your previous home diet once your are discharged from the hospital. ° °DRESSING / WOUND CARE / SHOWERING °You may shower 3 days after surgery, but keep the wounds dry during showering.  You may use an occlusive plastic wrap (Press'n Seal for example), NO SOAKING/SUBMERGING IN THE BATHTUB.  If the bandage  gets wet, change with a clean dry gauze.  If the incision gets wet, pat the wound dry with a clean towel. °You may start showering once you are discharged home but do not submerge the incision under water. Just pat the incision dry and apply a dry gauze dressing on daily. °Change the surgical dressing daily and reapply a dry dressing each time. ° °ACTIVITY °Walk with your walker as instructed. °Use walker as long as suggested by your caregivers. °Avoid periods of inactivity such as sitting longer than an hour when not asleep. This helps prevent blood clots.  °You may resume a sexual relationship in one month or when given the OK by your doctor.  °You may return to work once you are cleared by your doctor.  °Do not drive a car for 6 weeks or until released by you surgeon.  °Do not drive while taking narcotics. ° °WEIGHT BEARING °Weight bearing as tolerated with assist device (walker, cane, etc) as directed, use it as long as suggested by your surgeon or therapist, typically at least 4-6 weeks. ° °POSTOPERATIVE CONSTIPATION PROTOCOL °Constipation - defined medically as fewer than three stools per week and severe constipation as less than one stool per week. ° °One of the most common issues patients have following surgery is constipation.  Even if you have a regular bowel pattern at home, your normal regimen is likely to be disrupted due to multiple reasons following surgery.  Combination of anesthesia, postoperative narcotics, change in appetite and fluid intake all can affect your bowels.    In order to avoid complications following surgery, here are some recommendations in order to help you during your recovery period. ° °Colace (docusate) - Pick up an over-the-counter form of Colace or another stool softener and take twice a day as long as you are requiring postoperative pain medications.  Take with a full glass of water daily.  If you experience loose stools or diarrhea, hold the colace until you stool forms back  up.  If your symptoms do not get better within 1 week or if they get worse, check with your doctor. ° °Dulcolax (bisacodyl) - Pick up over-the-counter and take as directed by the product packaging as needed to assist with the movement of your bowels.  Take with a full glass of water.  Use this product as needed if not relieved by Colace only.  ° °MiraLax (polyethylene glycol) - Pick up over-the-counter to have on hand.  MiraLax is a solution that will increase the amount of water in your bowels to assist with bowel movements.  Take as directed and can mix with a glass of water, juice, soda, coffee, or tea.  Take if you go more than two days without a movement. °Do not use MiraLax more than once per day. Call your doctor if you are still constipated or irregular after using this medication for 7 days in a row. ° °If you continue to have problems with postoperative constipation, please contact the office for further assistance and recommendations.  If you experience "the worst abdominal pain ever" or develop nausea or vomiting, please contact the office immediatly for further recommendations for treatment. ° °ITCHING ° If you experience itching with your medications, try taking only a single pain pill, or even half a pain pill at a time.  You can also use Benadryl over the counter for itching or also to help with sleep.  ° °TED HOSE STOCKINGS °Wear the elastic stockings on both legs for three weeks following surgery during the day but you may remove then at night for sleeping. ° °MEDICATIONS °See your medication summary on the “After Visit Summary” that the nursing staff will review with you prior to discharge.  You may have some home medications which will be placed on hold until you complete the course of blood thinner medication.  It is important for you to complete the blood thinner medication as prescribed by your surgeon.  Continue your approved medications as instructed at time of discharge. ° °PRECAUTIONS °If  you experience chest pain or shortness of breath - call 911 immediately for transfer to the hospital emergency department.  °If you develop a fever greater that 101 F, purulent drainage from wound, increased redness or drainage from wound, foul odor from the wound/dressing, or calf pain - CONTACT YOUR SURGEON.   °                                                °FOLLOW-UP APPOINTMENTS °Make sure you keep all of your appointments after your operation with your surgeon and caregivers. You should call the office at the above phone number and make an appointment for approximately two weeks after the date of your surgery or on the date instructed by your surgeon outlined in the "After Visit Summary". ° ° °RANGE OF MOTION AND STRENGTHENING EXERCISES  °Rehabilitation of the knee is important following a knee injury or   an operation. After just a few days of immobilization, the muscles of the thigh which control the knee become weakened and shrink (atrophy). Knee exercises are designed to build up the tone and strength of the thigh muscles and to improve knee motion. Often times heat used for twenty to thirty minutes before working out will loosen up your tissues and help with improving the range of motion but do not use heat for the first two weeks following surgery. These exercises can be done on a training (exercise) mat, on the floor, on a table or on a bed. Use what ever works the best and is most comfortable for you Knee exercises include:  °• Leg Lifts - While your knee is still immobilized in a splint or cast, you can do straight leg raises. Lift the leg to 60 degrees, hold for 3 sec, and slowly lower the leg. Repeat 10-20 times 2-3 times daily. Perform this exercise against resistance later as your knee gets better.  °• Quad and Hamstring Sets - Tighten up the muscle on the front of the thigh (Quad) and hold for 5-10 sec. Repeat this 10-20 times hourly. Hamstring sets are done by pushing the foot backward against an  object and holding for 5-10 sec. Repeat as with quad sets.  °· Leg Slides: Lying on your back, slowly slide your foot toward your buttocks, bending your knee up off the floor (only go as far as is comfortable). Then slowly slide your foot back down until your leg is flat on the floor again. °· Angel Wings: Lying on your back spread your legs to the side as far apart as you can without causing discomfort.  °A rehabilitation program following serious knee injuries can speed recovery and prevent re-injury in the future due to weakened muscles. Contact your doctor or a physical therapist for more information on knee rehabilitation.  ° °IF YOU ARE TRANSFERRED TO A SKILLED REHAB FACILITY °If the patient is transferred to a skilled rehab facility following release from the hospital, a list of the current medications will be sent to the facility for the patient to continue.  When discharged from the skilled rehab facility, please have the facility set up the patient's Home Health Physical Therapy prior to being released. Also, the skilled facility will be responsible for providing the patient with their medications at time of release from the facility to include their pain medication, the muscle relaxants, and their blood thinner medication. If the patient is still at the rehab facility at time of the two week follow up appointment, the skilled rehab facility will also need to assist the patient in arranging follow up appointment in our office and any transportation needs. ° °MAKE SURE YOU:  °• Understand these instructions.  °• Get help right away if you are not doing well or get worse.  ° ° °Pick up stool softner and laxative for home use following surgery while on pain medications. °Do not submerge incision under water. °Please use good hand washing techniques while changing dressing each day. °May shower starting three days after surgery. °Please use a clean towel to pat the incision dry following showers. °Continue to  use ice for pain and swelling after surgery. °Do not use any lotions or creams on the incision until instructed by your surgeon. ° °

## 2018-07-14 ENCOUNTER — Encounter (HOSPITAL_COMMUNITY): Payer: Self-pay | Admitting: Orthopedic Surgery

## 2018-07-14 DIAGNOSIS — M170 Bilateral primary osteoarthritis of knee: Secondary | ICD-10-CM | POA: Diagnosis not present

## 2018-07-14 DIAGNOSIS — G4733 Obstructive sleep apnea (adult) (pediatric): Secondary | ICD-10-CM | POA: Diagnosis not present

## 2018-07-14 DIAGNOSIS — M1711 Unilateral primary osteoarthritis, right knee: Secondary | ICD-10-CM | POA: Diagnosis present

## 2018-07-14 LAB — CBC
HCT: 38.8 % — ABNORMAL LOW (ref 39.0–52.0)
Hemoglobin: 12.9 g/dL — ABNORMAL LOW (ref 13.0–17.0)
MCH: 30.9 pg (ref 26.0–34.0)
MCHC: 33.2 g/dL (ref 30.0–36.0)
MCV: 92.8 fL (ref 80.0–100.0)
Platelets: 284 10*3/uL (ref 150–400)
RBC: 4.18 MIL/uL — ABNORMAL LOW (ref 4.22–5.81)
RDW: 12.6 % (ref 11.5–15.5)
WBC: 17.4 10*3/uL — ABNORMAL HIGH (ref 4.0–10.5)
nRBC: 0 % (ref 0.0–0.2)

## 2018-07-14 LAB — GLUCOSE, CAPILLARY
GLUCOSE-CAPILLARY: 129 mg/dL — AB (ref 70–99)
Glucose-Capillary: 141 mg/dL — ABNORMAL HIGH (ref 70–99)
Glucose-Capillary: 147 mg/dL — ABNORMAL HIGH (ref 70–99)
Glucose-Capillary: 189 mg/dL — ABNORMAL HIGH (ref 70–99)

## 2018-07-14 LAB — BASIC METABOLIC PANEL
Anion gap: 10 (ref 5–15)
BUN: 16 mg/dL (ref 6–20)
CO2: 25 mmol/L (ref 22–32)
Calcium: 8.6 mg/dL — ABNORMAL LOW (ref 8.9–10.3)
Chloride: 100 mmol/L (ref 98–111)
Creatinine, Ser: 1.32 mg/dL — ABNORMAL HIGH (ref 0.61–1.24)
GFR calc Af Amer: >60 mL/min (ref >60)
GFR calc non Af Amer: 59 mL/min — ABNORMAL LOW (ref >60)
Glucose, Bld: 135 mg/dL — ABNORMAL HIGH (ref 70–99)
Potassium: 4.3 mmol/L (ref 3.5–5.1)
Sodium: 135 mmol/L (ref 135–145)

## 2018-07-14 MED ORDER — ASPIRIN 325 MG PO TBEC: 325.0000 mg | DELAYED_RELEASE_TABLET | Freq: Two times a day (BID) | ORAL | 0 refills | Status: AC

## 2018-07-14 MED ORDER — OXYCODONE HCL 5 MG PO TABS: 5.0000 mg | ORAL_TABLET | Freq: Four times a day (QID) | ORAL | 0 refills | Status: DC | PRN

## 2018-07-14 MED ORDER — METHOCARBAMOL 500 MG PO TABS: 500.0000 mg | ORAL_TABLET | Freq: Four times a day (QID) | ORAL | 0 refills | Status: DC | PRN

## 2018-07-14 NOTE — Transfer of Care (Signed)
Immediate Anesthesia Transfer of Care Note  Patient: Jason French  Procedure(s) Performed: RIGHT TOTAL KNEE ARTHROPLASTY,INJECTION OF LEFT KNEE (Right Knee)  Patient Location: PACU  Anesthesia Type:Spinal  Level of Consciousness: awake, alert  and oriented  Airway & Oxygen Therapy: Patient Spontanous Breathing and Patient connected to nasal cannula oxygen  Post-op Assessment: Report given to RN and Post -op Vital signs reviewed and stable  Post vital signs: Reviewed and stable  Last Vitals:  Vitals Value Taken Time  BP    Temp    Pulse    Resp    SpO2      Last Pain:  Vitals:   07/14/18 0615  TempSrc:   PainSc: 8       Patients Stated Pain Goal: 4 (53/66/44 0347)  Complications: No apparent anesthesia complications

## 2018-07-14 NOTE — Progress Notes (Signed)
   Subjective: 1 Day Post-Op Procedure(s) (LRB): RIGHT TOTAL KNEE ARTHROPLASTY,INJECTION OF LEFT KNEE (Right) Patient reports pain as mild.   Patient seen in rounds by Dr. Wynelle Link. Patient is well, and has had no acute complaints or problems other than pain in the right knee. Denies chest pain, SOB, or calf pain. Foley catheter removed this AM. No issues overnight. We will start therapy today.   Objective: Vital signs in last 24 hours: Temp:  [97.6 F (36.4 C)-98.7 F (37.1 C)] 98.4 F (36.9 C) (12/17 0514) Pulse Rate:  [70-107] 73 (12/17 0514) Resp:  [9-20] 17 (12/16 2152) BP: (102-137)/(69-94) 114/80 (12/17 0514) SpO2:  [90 %-100 %] 97 % (12/17 0514) Weight:  [117.9 kg] 117.9 kg (12/16 1110)  Intake/Output from previous day:  Intake/Output Summary (Last 24 hours) at 07/14/2018 0807 Last data filed at 07/14/2018 0600 Gross per 24 hour  Intake 4345.68 ml  Output 1270 ml  Net 3075.68 ml    Labs: Recent Labs    07/14/18 0458  HGB 12.9*   Recent Labs    07/14/18 0458  WBC 17.4*  RBC 4.18*  HCT 38.8*  PLT 284   Recent Labs    07/14/18 0458  NA 135  K 4.3  CL 100  CO2 25  BUN 16  CREATININE 1.32*  GLUCOSE 135*  CALCIUM 8.6*   Exam: General - Patient is Alert and Oriented Extremity - Neurologically intact Neurovascular intact Sensation intact distally Dorsiflexion/Plantar flexion intact Dressing - dressing C/D/I Motor Function - intact, moving foot and toes well on exam.   Past Medical History:  Diagnosis Date  . Arthritis   . Cancer [facility])    adrenal cancer  . Diabetes mellitus without complication ([place])   . Diverticulitis   . Hyperlipidemia   . Hypertension   . Neuropathy   . RLS (restless legs syndrome)   . Sleep apnea    use CPAP    Assessment/Plan: 1 Day Post-Op Procedure(s) (LRB): RIGHT TOTAL KNEE ARTHROPLASTY,INJECTION OF LEFT KNEE (Right) Principal Problem:   Primary osteoarthritis of right knee Active Problems:   Primary  osteoarthritis of left knee   OA (osteoarthritis) of knee  Estimated body mass index is 38.4 kg/m as calculated from the following:   Height as of this encounter: 5\' 9"  (1.753 m).   Weight as of this encounter: 117.9 kg. Advance diet Up with therapy  Anticipated LOS equal to or greater than 2 midnights due to - Age 81 and older with one or more of the following:  - Obesity  - Expected need for hospital services (PT, OT, Nursing) required for safe  discharge  - Anticipated need for postoperative skilled nursing care or inpatient rehab  - Active co-morbidities: Diabetes OR   - Unanticipated findings during/Post Surgery: None  - Patient is a high risk of re-admission due to: None    DVT Prophylaxis - Aspirin Weight bearing as tolerated. D/C O2 and pulse ox and try on room air. Hemovac pulled without difficulty, will begin therapy today.  Plan is to go Home after hospital stay. Plan for discharge tomorrow pending progress with therapy.  Theresa Duty, PA-C Orthopedic Surgery 07/14/2018, 8:07 AM

## 2018-07-14 NOTE — Anesthesia Postprocedure Evaluation (Signed)
Anesthesia Post Note  Patient: Jason French  Procedure(s) Performed: RIGHT TOTAL KNEE ARTHROPLASTY,INJECTION OF LEFT KNEE (Right Knee)     Patient location during evaluation: PACU Anesthesia Type: Spinal Level of consciousness: awake and alert Pain management: pain level controlled Vital Signs Assessment: post-procedure vital signs reviewed and stable Respiratory status: spontaneous breathing and respiratory function stable Cardiovascular status: blood pressure returned to baseline and stable Postop Assessment: spinal receding and no apparent nausea or vomiting Anesthetic complications: no    Last Vitals:  Vitals:   07/14/18 0832 07/14/18 0900  BP: 115/68 117/77  Pulse: 79 86  Resp:  14  Temp:  36.5 C  SpO2: 94% 98%    Last Pain:  Vitals:   07/14/18 0900  TempSrc: Oral  PainSc:                  Audry Pili

## 2018-07-14 NOTE — Care Management Note (Signed)
Case Management Note  Patient Details  Name: Jason French MRN: [id number] Date of Birth: Dec 29, 1960  Subjective/Objective:      Spoke with patient at bedside. Confirmed plan for OP PT, already arranged. Has RW and 3n1. [phone]              Action/Plan:   Expected Discharge Date:  07/14/18               Expected Discharge Plan:  OP Rehab  In-House Referral:  NA  Discharge planning Services  CM Consult, NA  Post Acute Care Choice:  NA Choice offered to:  Patient  DME Arranged:  N/A DME Agency:  NA  HH Arranged:  NA HH Agency:  NA  Status of Service:  Completed, signed off  If discussed at [place] of Stay Meetings, dates discussed:    Additional Comments:  Guadalupe Maple, RN 07/14/2018, 12:14 PM

## 2018-07-14 NOTE — Progress Notes (Signed)
Physical Therapy Treatment Patient Details Name: Jason French MRN: [id number] DOB: [birth year] Today's Date: 07/14/2018    History of Present Illness 57 yo male s/p R TKA 07/13/18.     PT Comments    Progressing with mobility. Plan is for d/c home tomorrow with OP PT f/u.    Follow Up Recommendations  Follow surgeon's recommendation for DC plan and follow-up therapies     Equipment Recommendations  None recommended by PT    Recommendations for Other Services       Precautions / Restrictions Precautions Precautions: Fall;Knee Restrictions Weight Bearing Restrictions: No Other Position/Activity Restrictions: WBAT    Mobility  Bed Mobility Overal bed mobility: Needs Assistance Bed Mobility: Sit to Supine     Supine to sit: Min assist;HOB elevated Sit to supine: Min assist   General bed mobility comments: Assist for R LE. Cues for safety, technique.   Transfers Overall transfer level: Needs assistance Equipment used: Rolling walker (2 wheeled) Transfers: Sit to/from Stand Sit to Stand: Min guard         General transfer comment: Close guard for safety. VCs safety, technique, hand/LE placement.   Ambulation/Gait Ambulation/Gait assistance: Min guard  Gait Distance (Feet): 125 Feet Assistive device: Rolling walker (2 wheeled) Gait Pattern/deviations: Step-to pattern;Step-through pattern;Decreased stride length     General Gait Details: Close guard for safety.    Stairs             Wheelchair Mobility    Modified Rankin (Stroke Patients Only)       Balance Overall balance assessment: Mild deficits observed, not formally tested                                          Cognition Arousal/Alertness: Awake/alert Behavior During Therapy: WFL for tasks assessed/performed Overall Cognitive Status: Within Functional Limits for tasks assessed                                        Exercises Total Joint  Exercises Ankle Circles/Pumps: AROM;Both;10 reps;Supine Quad Sets: AROM;10 reps;Both;Supine Heel Slides: AAROM;10 reps;Supine;Right Hip ABduction/ADduction: AAROM;Right;10 reps;Supine Straight Leg Raises: AAROM;Right;10 reps;Supine Goniometric ROM: ~10-50 degrees    General Comments        Pertinent Vitals/Pain Pain Assessment: 0-10 Pain Score: 6  Pain Location: R knee Pain Descriptors / Indicators: Aching;Sore Pain Intervention(s): Monitored during session;Repositioned;Ice applied    Home Living Family/patient expects to be discharged to:: Private residence Living Arrangements: Spouse/significant other Available Help at Discharge: Family Type of Home: House Home Access: Stairs to enter Entrance Stairs-Rails: None Home Layout: One level Home Equipment: Crutches;Walker - 2 wheels;Cane - single point      Prior Function Level of Independence: Independent          PT Goals (current goals can now be found in the care plan section) Acute Rehab PT Goals Patient Stated Goal: less pain.  PT Goal Formulation: With patient Time For Goal Achievement: 07/28/18 Potential to Achieve Goals: Good Progress towards PT goals: Progressing toward goals    Frequency    7X/week      PT Plan Current plan remains appropriate    Co-evaluation              AM-PAC PT "6 Clicks" Mobility   Outcome Measure  Help needed  turning from your back to your side while in a flat bed without using bedrails?: A Little Help needed moving from lying on your back to sitting on the side of a flat bed without using bedrails?: A Little Help needed moving to and from a bed to a chair (including a wheelchair)?: A Little Help needed standing up from a chair using your arms (e.g., wheelchair or bedside chair)?: A Little Help needed to walk in hospital room?: A Little Help needed climbing 3-5 steps with a railing? : A Little 6 Click Score: 18    End of Session Equipment Utilized During Treatment:  Gait belt Activity Tolerance: Patient tolerated treatment well Patient left: in bed;with call bell/phone within reach   PT Visit Diagnosis: Muscle weakness (generalized) (M62.81);Difficulty in walking, not elsewhere classified (R26.2);Pain Pain - Right/Left: Right Pain - part of body: Knee     Time: 1441-1501 PT Time Calculation (min) (ACUTE ONLY): 20 min  Charges:  $Gait Training: 8-22 mins                       Weston Anna, PT Acute Rehabilitation Services Pager: [phone] Office: [phone]

## 2018-07-14 NOTE — Plan of Care (Signed)
  Problem: Health Behavior/Discharge Planning: Goal: Ability to manage health-related needs will improve Outcome: Progressing   Problem: Clinical Measurements: Goal: Ability to maintain clinical measurements within normal limits will improve Outcome: Progressing Goal: Will remain free from infection Outcome: Progressing Goal: Diagnostic test results will improve Outcome: Progressing Goal: Respiratory complications will improve Outcome: Progressing Goal: Cardiovascular complication will be avoided Outcome: Progressing   Problem: Activity: Goal: Risk for activity intolerance will decrease Outcome: Progressing   Problem: Nutrition: Goal: Adequate nutrition will be maintained Outcome: Progressing   Problem: Coping: Goal: Level of anxiety will decrease Outcome: Progressing   Problem: Elimination: Goal: Will not experience complications related to bowel motility Outcome: Progressing Goal: Will not experience complications related to urinary retention Outcome: Progressing   Problem: Pain Managment: Goal: General experience of comfort will improve Outcome: Progressing   Problem: Safety: Goal: Ability to remain free from injury will improve Outcome: Progressing   Problem: Skin Integrity: Goal: Risk for impaired skin integrity will decrease Outcome: Progressing   Problem: Education: Goal: Knowledge of the prescribed therapeutic regimen will improve Outcome: Progressing Goal: Individualized Educational Video(s) Outcome: Progressing   Problem: Activity: Goal: Ability to avoid complications of mobility impairment will improve Outcome: Progressing Goal: Range of joint motion will improve Outcome: Progressing   Problem: Clinical Measurements: Goal: Postoperative complications will be avoided or minimized Outcome: Progressing   Problem: Pain Management: Goal: Pain level will decrease with appropriate interventions Outcome: Progressing   Problem: Skin  Integrity: Goal: Will show signs of wound healing Outcome: Progressing

## 2018-07-14 NOTE — Evaluation (Signed)
Physical Therapy Evaluation Patient Details Name: Jason French MRN: [id number] DOB: 12-20-1960 Today's Date: 07/14/2018   History of Present Illness  57 yo male s/p R TKA 07/13/18.   Clinical Impression  On eval, pt required Min assist for mobility. He walked ~60 feet with with a RW. Moderate pain with activity. Will follow and progress activity as tolerated. Plan is for d/c home with OP PT f/u.     Follow Up Recommendations Follow surgeon's recommendation for DC plan and follow-up therapies    Equipment Recommendations  None recommended by PT    Recommendations for Other Services       Precautions / Restrictions Precautions Precautions: Fall;Knee Restrictions Weight Bearing Restrictions: No Other Position/Activity Restrictions: WBAT      Mobility  Bed Mobility Overal bed mobility: Needs Assistance Bed Mobility: Supine to Sit     Supine to sit: Min assist;HOB elevated     General bed mobility comments: Assist for R LE. Cues for safety, technique.   Transfers Overall transfer level: Needs assistance Equipment used: Rolling walker (2 wheeled) Transfers: Sit to/from Stand Sit to Stand: Min assist         General transfer comment: VCs safety, technique, hand/LE placement. Assist to rise, stabilize, control descent.   Ambulation/Gait Ambulation/Gait assistance: Min assist Gait Distance (Feet): 60 Feet Assistive device: Rolling walker (2 wheeled) Gait Pattern/deviations: Step-to pattern     General Gait Details: VCs safety, technique, sequence. Assist to stabilize. Wife followed with recliner for safety but did not have to use.   Stairs            Wheelchair Mobility    Modified Rankin (Stroke Patients Only)       Balance Overall balance assessment: Mild deficits observed, not formally tested                                           Pertinent Vitals/Pain Pain Assessment: 0-10 Pain Score: 7  Pain Location: R knee Pain  Descriptors / Indicators: Aching;Sore Pain Intervention(s): Limited activity within patient's tolerance;Monitored during session;Repositioned;Ice applied    Home Living Family/patient expects to be discharged to:: Private residence Living Arrangements: Spouse/significant other Available Help at Discharge: Family Type of Home: House Home Access: Stairs to enter Entrance Stairs-Rails: None Entrance Stairs-Number of Steps: 2 Home Layout: One level Home Equipment: Crutches;Walker - 2 wheels;Cane - single point      Prior Function Level of Independence: Independent               Hand Dominance        Extremity/Trunk Assessment   Upper Extremity Assessment Upper Extremity Assessment: Overall WFL for tasks assessed    Lower Extremity Assessment Lower Extremity Assessment: Generalized weakness    Cervical / Trunk Assessment Cervical / Trunk Assessment: Normal  Communication   Communication: No difficulties  Cognition Arousal/Alertness: Awake/alert Behavior During Therapy: WFL for tasks assessed/performed Overall Cognitive Status: Within Functional Limits for tasks assessed                                        General Comments      Exercises Total Joint Exercises Ankle Circles/Pumps: AROM;Both;10 reps;Supine Quad Sets: AROM;10 reps;Both;Supine Heel Slides: AAROM;10 reps;Supine;Right Hip ABduction/ADduction: AAROM;Right;10 reps;Supine Straight Leg Raises: AAROM;Right;10 reps;Supine Goniometric ROM: ~10-50 degrees  Assessment/Plan    PT Assessment Patient needs continued PT services  PT Problem List Decreased strength;Decreased balance;Decreased range of motion;Decreased mobility;Pain;Decreased activity tolerance;Decreased knowledge of use of DME       PT Treatment Interventions DME instruction;Gait training;Functional mobility training;Therapeutic activities;Balance training;Patient/family education;Therapeutic exercise    PT Goals  (Current goals can be found in the Care Plan section)  Acute Rehab PT Goals Patient Stated Goal: less pain.  PT Goal Formulation: With patient Time For Goal Achievement: 07/28/18 Potential to Achieve Goals: Good    Frequency 7X/week   Barriers to discharge        Co-evaluation               AM-PAC PT "6 Clicks" Mobility  Outcome Measure Help needed turning from your back to your side while in a flat bed without using bedrails?: A Little Help needed moving from lying on your back to sitting on the side of a flat bed without using bedrails?: A Little Help needed moving to and from a bed to a chair (including a wheelchair)?: A Little Help needed standing up from a chair using your arms (e.g., wheelchair or bedside chair)?: A Little Help needed to walk in hospital room?: A Little Help needed climbing 3-5 steps with a railing? : A Little 6 Click Score: 18    End of Session Equipment Utilized During Treatment: Gait belt Activity Tolerance: Patient tolerated treatment well Patient left: in chair;with call bell/phone within reach;with family/visitor present   PT Visit Diagnosis: Muscle weakness (generalized) (M62.81);Difficulty in walking, not elsewhere classified (R26.2);Pain Pain - Right/Left: Right Pain - part of body: Knee    Time: 0350-0938 PT Time Calculation (min) (ACUTE ONLY): 27 min   Charges:   PT Evaluation $PT Eval Low Complexity: 1 Low PT Treatments $Gait Training: 8-22 mins         Weston Anna, PT Acute Rehabilitation Services Pager: [phone] Office: [phone]

## 2018-07-15 DIAGNOSIS — M170 Bilateral primary osteoarthritis of knee: Secondary | ICD-10-CM | POA: Diagnosis not present

## 2018-07-15 LAB — BASIC METABOLIC PANEL
Anion gap: 9 (ref 5–15)
BUN: 19 mg/dL (ref 6–20)
CO2: 27 mmol/L (ref 22–32)
Calcium: 8.9 mg/dL (ref 8.9–10.3)
Chloride: 100 mmol/L (ref 98–111)
Creatinine, Ser: 1.21 mg/dL (ref 0.61–1.24)
GFR calc Af Amer: >60 mL/min (ref >60)
GFR calc non Af Amer: >60 mL/min (ref >60)
Glucose, Bld: 136 mg/dL — ABNORMAL HIGH (ref 70–99)
Potassium: 3.7 mmol/L (ref 3.5–5.1)
Sodium: 136 mmol/L (ref 135–145)

## 2018-07-15 LAB — CBC
HCT: 38.6 % — ABNORMAL LOW (ref 39.0–52.0)
Hemoglobin: 12.8 g/dL — ABNORMAL LOW (ref 13.0–17.0)
MCH: 30.2 pg (ref 26.0–34.0)
MCHC: 33.2 g/dL (ref 30.0–36.0)
MCV: 91 fL (ref 80.0–100.0)
Platelets: 292 10*3/uL (ref 150–400)
RBC: 4.24 MIL/uL (ref 4.22–5.81)
RDW: 13 % (ref 11.5–15.5)
WBC: 16.9 10*3/uL — ABNORMAL HIGH (ref 4.0–10.5)
nRBC: 0 % (ref 0.0–0.2)

## 2018-07-15 LAB — GLUCOSE, CAPILLARY: Glucose-Capillary: 85 mg/dL (ref 70–99)

## 2018-07-15 NOTE — Progress Notes (Signed)
Physical Therapy Treatment Patient Details Name: ABDULRAHEEM PINEO MRN: [id number] DOB: 1960-10-13 Today's Date: 07/15/2018    History of Present Illness 57 yo male s/p R TKA 07/13/18.     PT Comments    Progressing with mobility. Reviewed/practiced exercises, gait training, and stair training. Issued HEP for pt to perform 2x/day until he begins OP PT. All education completed. Okay to d/c home from PT standpoint-made RN aware.     Follow Up Recommendations  Follow surgeon's recommendation for DC plan and follow-up therapies     Equipment Recommendations  None recommended by PT    Recommendations for Other Services       Precautions / Restrictions Precautions Precautions: Fall;Knee Required Braces or Orthoses: Knee Immobilizer - Right Knee Immobilizer - Right: Discontinue once straight leg raise with < 10 degree lag Restrictions Weight Bearing Restrictions: No Other Position/Activity Restrictions: WBAT    Mobility  Bed Mobility               General bed mobility comments: oob in the recliner  Transfers Overall transfer level: Needs assistance Equipment used: Rolling walker (2 wheeled) Transfers: Sit to/from Stand Sit to Stand: Min guard         General transfer comment: Close guard for safety. VCs safety, technique, hand/LE placement.   Ambulation/Gait Ambulation/Gait assistance: Min guard Gait Distance (Feet): 125 Feet Assistive device: Rolling walker (2 wheeled) Gait Pattern/deviations: Step-through pattern;Decreased stride length     General Gait Details: Close guard for safety.    Stairs Stairs: Yes Stairs assistance: Min assist Stair Management: Step to pattern;Forwards;With crutches Number of Stairs: 2 General stair comments: Assist to steady pt throughout task. Cues for safety, technique, sequence. Wife was present to observe. Pt denied need to practice again-he felt comfortable with task.   Wheelchair Mobility    Modified Rankin (Stroke  Patients Only)       Balance Overall balance assessment: Mild deficits observed, not formally tested                                          Cognition Arousal/Alertness: Awake/alert Behavior During Therapy: WFL for tasks assessed/performed Overall Cognitive Status: Within Functional Limits for tasks assessed                                        Exercises Total Joint Exercises Ankle Circles/Pumps: AROM;Both;10 reps;Supine Quad Sets: AROM;10 reps;Both;Supine Heel Slides: AAROM;10 reps;Supine;Right Hip ABduction/ADduction: AAROM;Right;10 reps;Supine Straight Leg Raises: AAROM;Right;10 reps;Supine Goniometric ROM: ~10-50 degrees    General Comments        Pertinent Vitals/Pain Pain Assessment: 0-10 Pain Score: 7  Pain Location: R knee Pain Descriptors / Indicators: Sore;Aching Pain Intervention(s): Monitored during session;Repositioned    Home Living                      Prior Function            PT Goals (current goals can now be found in the care plan section) Progress towards PT goals: Progressing toward goals    Frequency    7X/week      PT Plan Current plan remains appropriate    Co-evaluation              AM-PAC PT "6 Clicks" Mobility  Outcome Measure  Help needed turning from your back to your side while in a flat bed without using bedrails?: A Little Help needed moving from lying on your back to sitting on the side of a flat bed without using bedrails?: A Little Help needed moving to and from a bed to a chair (including a wheelchair)?: A Little Help needed standing up from a chair using your arms (e.g., wheelchair or bedside chair)?: A Little Help needed to walk in hospital room?: A Little Help needed climbing 3-5 steps with a railing? : A Little 6 Click Score: 18    End of Session Equipment Utilized During Treatment: Gait belt;Right knee immobilizer Activity Tolerance: Patient tolerated  treatment well Patient left: in chair;with call bell/phone within reach;with family/visitor present   PT Visit Diagnosis: Muscle weakness (generalized) (M62.81);Difficulty in walking, not elsewhere classified (R26.2);Pain Pain - Right/Left: Right Pain - part of body: Knee     Time: 4818-5631 PT Time Calculation (min) (ACUTE ONLY): 19 min  Charges:  $Gait Training: 8-22 mins                        Weston Anna, [place] Pager: [phone] Office: [phone]

## 2018-07-15 NOTE — Plan of Care (Signed)
  Problem: Health Behavior/Discharge Planning: Goal: Ability to manage health-related needs will improve Outcome: Progressing   Problem: Clinical Measurements: Goal: Ability to maintain clinical measurements within normal limits will improve Outcome: Progressing Goal: Will remain free from infection Outcome: Progressing Goal: Diagnostic test results will improve Outcome: Progressing Goal: Respiratory complications will improve Outcome: Progressing Goal: Cardiovascular complication will be avoided Outcome: Progressing   Problem: Activity: Goal: Risk for activity intolerance will decrease Outcome: Progressing   Problem: Nutrition: Goal: Adequate nutrition will be maintained Outcome: Progressing   Problem: Coping: Goal: Level of anxiety will decrease Outcome: Progressing   Problem: Elimination: Goal: Will not experience complications related to bowel motility Outcome: Progressing Goal: Will not experience complications related to urinary retention Outcome: Progressing   Problem: Pain Managment: Goal: General experience of comfort will improve Outcome: Progressing   Problem: Safety: Goal: Ability to remain free from injury will improve Outcome: Progressing   Problem: Skin Integrity: Goal: Risk for impaired skin integrity will decrease Outcome: Progressing   Problem: Education: Goal: Knowledge of the prescribed therapeutic regimen will improve Outcome: Progressing Goal: Individualized Educational Video(s) Outcome: Progressing   Problem: Activity: Goal: Ability to avoid complications of mobility impairment will improve Outcome: Progressing Goal: Range of joint motion will improve Outcome: Progressing   Problem: Clinical Measurements: Goal: Postoperative complications will be avoided or minimized Outcome: Progressing   Problem: Pain Management: Goal: Pain level will decrease with appropriate interventions Outcome: Progressing   Problem: Skin  Integrity: Goal: Will show signs of wound healing Outcome: Progressing

## 2018-07-15 NOTE — Progress Notes (Signed)
   Subjective: 2 Days Post-Op Procedure(s) (LRB): RIGHT TOTAL KNEE ARTHROPLASTY,INJECTION OF LEFT KNEE (Right) Patient reports pain as mild.   Patient seen in rounds for Dr. Wynelle Link. Patient is well, and has had no acute complaints or problems. States he is ready to go home. No issues overnight. Denies chest pain, SOB, or calf pain. Voiding without difficulty and positive flatus.  Plan is to go Home after hospital stay.  Objective: Vital signs in last 24 hours: Temp:  [97.4 F (36.3 C)-98.5 F (36.9 C)] 97.4 F (36.3 C) (12/18 0623) Pulse Rate:  [63-86] 63 (12/18 0623) Resp:  [14-16] 16 (12/18 0623) BP: (114-130)/(71-88) 130/88 (12/18 0623) SpO2:  [92 %-100 %] 100 % (12/18 0623)  Intake/Output from previous day:  Intake/Output Summary (Last 24 hours) at 07/15/2018 0834 Last data filed at 07/15/2018 0600 Gross per 24 hour  Intake 1858.68 ml  Output 3225 ml  Net -1366.32 ml    Intake/Output this shift: No intake/output data recorded.  Labs: Recent Labs    07/14/18 0458 07/15/18 0554  HGB 12.9* 12.8*   Recent Labs    07/14/18 0458 07/15/18 0554  WBC 17.4* 16.9*  RBC 4.18* 4.24  HCT 38.8* 38.6*  PLT 284 292   Recent Labs    07/14/18 0458 07/15/18 0554  NA 135 136  K 4.3 3.7  CL 100 100  CO2 25 27  BUN 16 19  CREATININE 1.32* 1.21  GLUCOSE 135* 136*  CALCIUM 8.6* 8.9   No results for input(s): LABPT, INR in the last 72 hours.  Exam: General - Patient is Alert and Oriented Extremity - Neurologically intact Neurovascular intact Sensation intact distally Dorsiflexion/Plantar flexion intact Dressing/Incision - clean, dry, no drainage Motor Function - intact, moving foot and toes well on exam.   Past Medical History:  Diagnosis Date  . Arthritis   . Cancer [facility])    adrenal cancer  . Diabetes mellitus without complication ([place])   . Diverticulitis   . Hyperlipidemia   . Hypertension   . Neuropathy   . RLS (restless legs syndrome)   . Sleep apnea     use CPAP    Assessment/Plan: 2 Days Post-Op Procedure(s) (LRB): RIGHT TOTAL KNEE ARTHROPLASTY,INJECTION OF LEFT KNEE (Right) Principal Problem:   Primary osteoarthritis of right knee Active Problems:   Primary osteoarthritis of left knee   OA (osteoarthritis) of knee   Osteoarthritis of right knee  Estimated body mass index is 38.4 kg/m as calculated from the following:   Height as of this encounter: 5\' 9"  (1.753 m).   Weight as of this encounter: 117.9 kg. Up with therapy D/C IV fluids  DVT Prophylaxis - Aspirin Weight-bearing as tolerated  Plan for discharge to home after one session of therapy this AM. Scheduled for outpatient physical therapy at [facility] in [place]. Follow-up in the office in 2 weeks with Dr. Wynelle Link.  Theresa Duty, PA-C Orthopedic Surgery 07/15/2018, 8:34 AM

## 2018-07-17 DIAGNOSIS — M25561 Pain in right knee: Secondary | ICD-10-CM | POA: Diagnosis not present

## 2018-07-17 DIAGNOSIS — M25661 Stiffness of right knee, not elsewhere classified: Secondary | ICD-10-CM | POA: Diagnosis not present

## 2018-07-17 DIAGNOSIS — M25461 Effusion, right knee: Secondary | ICD-10-CM | POA: Diagnosis not present

## 2018-07-20 NOTE — Discharge Summary (Signed)
Physician Discharge Summary   Patient ID: Jason French MRN: [id number] DOB/AGE: Jan 18, 1961 57 y.o.  Admit date: 07/13/2018 Discharge date: 07/15/2018  Primary Diagnosis: Osteoarthritis, bilateral knees   Admission Diagnoses:  Past Medical History:  Diagnosis Date  . Arthritis   . Cancer [facility])    adrenal cancer  . Diabetes mellitus without complication ([place])   . Diverticulitis   . Hyperlipidemia   . Hypertension   . Neuropathy   . RLS (restless legs syndrome)   . Sleep apnea    use CPAP   Discharge Diagnoses:   Principal Problem:   Primary osteoarthritis of right knee Active Problems:   Primary osteoarthritis of left knee   OA (osteoarthritis) of knee   Osteoarthritis of right knee  Estimated body mass index is 38.4 kg/m as calculated from the following:   Height as of this encounter: 5\' 9"  (1.753 m).   Weight as of this encounter: 117.9 kg.  Procedure:  Procedure(s) (LRB): RIGHT TOTAL KNEE ARTHROPLASTY,INJECTION OF LEFT KNEE (Right)   Consults: None  HPI: Jason French is a 57 y.o. year old male with end stage OA of his right knee with progressively worsening pain and dysfunction. He has constant pain, with activity and at rest and significant functional deficits with difficulties even with ADLs. He has had extensive non-op management including analgesics, injections of cortisone and viscosupplements, and home exercise program, but remains in significant pain with significant dysfunction. Radiographs show bone on bone arthritis medial and patellofemoral. He presents now for right Total Knee Arthroplasty.  He also has symptomatic osteoarthritis of his left knee and requests cortisone injection  Laboratory Data: Admission on 07/13/2018, Discharged on 07/15/2018  Component Date Value Ref Range Status  . Glucose-Capillary 07/13/2018 113* 70 - 99 mg/dL Final  . Comment 1 07/13/2018 Notify RN   Final  . Comment 2 07/13/2018 Document in Chart   Final  .  Glucose-Capillary 07/13/2018 107* 70 - 99 mg/dL Final  . WBC 07/14/2018 17.4* 4.0 - 10.5 K/uL Final  . RBC 07/14/2018 4.18* 4.22 - 5.81 MIL/uL Final  . Hemoglobin 07/14/2018 12.9* 13.0 - 17.0 g/dL Final  . HCT 07/14/2018 38.8* 39.0 - 52.0 % Final  . MCV 07/14/2018 92.8  80.0 - 100.0 fL Final  . MCH 07/14/2018 30.9  26.0 - 34.0 pg Final  . MCHC 07/14/2018 33.2  30.0 - 36.0 g/dL Final  . RDW 07/14/2018 12.6  11.5 - 15.5 % Final  . Platelets 07/14/2018 284  150 - 400 K/uL Final  . nRBC 07/14/2018 0.0  0.0 - 0.2 % Final   Performed at [facility], [place] [address], [place], [place] [address]  . Sodium 07/14/2018 135  135 - 145 mmol/L Final  . Potassium 07/14/2018 4.3  3.5 - 5.1 mmol/L Final  . Chloride 07/14/2018 100  98 - 111 mmol/L Final  . CO2 07/14/2018 25  22 - 32 mmol/L Final  . Glucose, Bld 07/14/2018 135* 70 - 99 mg/dL Final  . BUN 07/14/2018 16  6 - 20 mg/dL Final  . Creatinine, Ser 07/14/2018 1.32* 0.61 - 1.24 mg/dL Final  . Calcium 07/14/2018 8.6* 8.9 - 10.3 mg/dL Final  . GFR calc non Af Amer 07/14/2018 59* >60 mL/min Final  . GFR calc Af Amer 07/14/2018 >60  >60 mL/min Final  . Anion gap 07/14/2018 10  5 - 15 Final   Performed at [facility], [place] [address], [place], [place] [address]  . Glucose-Capillary 07/13/2018 189*  70 - 99 mg/dL Final  . Glucose-Capillary 07/13/2018 255* 70 - 99 mg/dL Final  . Glucose-Capillary 07/14/2018 141* 70 - 99 mg/dL Final  . Glucose-Capillary 07/14/2018 129* 70 - 99 mg/dL Final  . Glucose-Capillary 07/14/2018 147* 70 - 99 mg/dL Final  . WBC 07/15/2018 16.9* 4.0 - 10.5 K/uL Final  . RBC 07/15/2018 4.24  4.22 - 5.81 MIL/uL Final  . Hemoglobin 07/15/2018 12.8* 13.0 - 17.0 g/dL Final  . HCT 07/15/2018 38.6* 39.0 - 52.0 % Final  . MCV 07/15/2018 91.0  80.0 - 100.0 fL Final  . MCH 07/15/2018 30.2  26.0 - 34.0 pg Final  . MCHC 07/15/2018 33.2  30.0 - 36.0 g/dL Final  . RDW 07/15/2018 13.0  11.5 - 15.5 % Final    . Platelets 07/15/2018 292  150 - 400 K/uL Final  . nRBC 07/15/2018 0.0  0.0 - 0.2 % Final   Performed at [facility], [place] [address], [place], [place] [address]  . Sodium 07/15/2018 136  135 - 145 mmol/L Final  . Potassium 07/15/2018 3.7  3.5 - 5.1 mmol/L Final  . Chloride 07/15/2018 100  98 - 111 mmol/L Final  . CO2 07/15/2018 27  22 - 32 mmol/L Final  . Glucose, Bld 07/15/2018 136* 70 - 99 mg/dL Final  . BUN 07/15/2018 19  6 - 20 mg/dL Final  . Creatinine, Ser 07/15/2018 1.21  0.61 - 1.24 mg/dL Final  . Calcium 07/15/2018 8.9  8.9 - 10.3 mg/dL Final  . GFR calc non Af Amer 07/15/2018 >60  >60 mL/min Final  . GFR calc Af Amer 07/15/2018 >60  >60 mL/min Final  . Anion gap 07/15/2018 9  5 - 15 Final   Performed at [facility], [place] [address] [place], [place] [address]  . Glucose-Capillary 07/14/2018 189* 70 - 99 mg/dL Final  . Glucose-Capillary 07/15/2018 85  70 - 99 mg/dL Final  Hospital Outpatient Visit on 07/07/2018  Component Date Value Ref Range Status  . Glucose-Capillary 07/07/2018 142* 70 - 99 mg/dL Final  . MRSA, PCR 07/07/2018 NEGATIVE  NEGATIVE Final  . Staphylococcus aureus 07/07/2018 NEGATIVE  NEGATIVE Final   Comment: (NOTE) The Xpert SA Assay (FDA approved for NASAL specimens in patients 20 years of age and older), is one component of a comprehensive surveillance program. It is not intended to diagnose infection nor to guide or monitor treatment. Performed at [facility], [place] [address], [place], [place] [address]   . aPTT 07/07/2018 31  24 - 36 seconds Final   Performed at [facility], [place] [address], [place], [place] [address]  . WBC 07/07/2018 11.0* 4.0 - 10.5 K/uL Final  . RBC 07/07/2018 4.75  4.22 - 5.81 MIL/uL Final  . Hemoglobin 07/07/2018 14.5  13.0 - 17.0 g/dL Final  . HCT 07/07/2018 43.9  39.0 - 52.0 % Final  . MCV 07/07/2018 92.4  80.0 - 100.0 fL Final  . MCH 07/07/2018  30.5  26.0 - 34.0 pg Final  . MCHC 07/07/2018 33.0  30.0 - 36.0 g/dL Final  . RDW 07/07/2018 12.9  11.5 - 15.5 % Final  . Platelets 07/07/2018 326  150 - 400 K/uL Final  . nRBC 07/07/2018 0.0  0.0 - 0.2 % Final   Performed at [facility], [place] [address] [place], [place] [address]  . Sodium 07/07/2018 138  135 - 145 mmol/L Final  . Potassium 07/07/2018 4.2  3.5 - 5.1 mmol/L Final  .  Chloride 07/07/2018 102  98 - 111 mmol/L Final  . CO2 07/07/2018 29  22 - 32 mmol/L Final  . Glucose, Bld 07/07/2018 97  70 - 99 mg/dL Final  . BUN 07/07/2018 19  6 - 20 mg/dL Final  . Creatinine, Ser 07/07/2018 1.27* 0.61 - 1.24 mg/dL Final  . Calcium 07/07/2018 9.6  8.9 - 10.3 mg/dL Final  . Total Protein 07/07/2018 7.0  6.5 - 8.1 g/dL Final  . Albumin 07/07/2018 4.1  3.5 - 5.0 g/dL Final  . AST 07/07/2018 20  15 - 41 U/L Final  . ALT 07/07/2018 19  0 - 44 U/L Final  . Alkaline Phosphatase 07/07/2018 72  38 - 126 U/L Final  . Total Bilirubin 07/07/2018 0.9  0.3 - 1.2 mg/dL Final  . GFR calc non Af Amer 07/07/2018 >60  >60 mL/min Final  . GFR calc Af Amer 07/07/2018 >60  >60 mL/min Final  . Anion gap 07/07/2018 7  5 - 15 Final   Performed at [facility], [place] [address], [place], [place] [address]  . Prothrombin Time 07/07/2018 15.4* 11.4 - 15.2 seconds Final  . INR 07/07/2018 1.23   Final   Performed at [facility], [place] [address], [place], [place] [address]  . ABO/RH(D) 07/07/2018 O POS   Final  . Antibody Screen 07/07/2018 NEG   Final  . Sample Expiration 07/07/2018 07/16/2018   Final  . Extend sample reason 07/07/2018    Final                   Value:NO TRANSFUSIONS OR PREGNANCY IN THE PAST 3 MONTHS Performed at [facility], [place] [address] [place], [place] [address]   . Hgb A1c MFr Bld 07/07/2018 6.1* 4.8 - 5.6 % Final   Comment: (NOTE) Pre diabetes:          5.7%-6.4% Diabetes:              >6.4% Glycemic control  for   <7.0% adults with diabetes   . Mean Plasma Glucose 07/07/2018 128.37  mg/dL Final   Performed at [place] [address], [place], [place] [address]  . ABO/RH(D) 07/07/2018    Final                   Value:O POS Performed at [facility], [place] [address] [place],  [address]      X-Rays:No results found.  EKG: Orders placed or performed during the hospital encounter of 07/07/18  . EKG 12 lead  . EKG 12 lead     Hospital Course: Jason French is a 57 y.o. who was admitted to [facility]. They were brought to the operating room on 07/13/2018 and underwent Procedure(s): RIGHT TOTAL KNEE ARTHROPLASTY,INJECTION OF LEFT KNEE.  Patient tolerated the procedure well and was later transferred to the recovery room and then to the orthopaedic floor for postoperative care. They were given PO and IV analgesics for pain control following their surgery. They were given 24 hours of postoperative antibiotics of  Anti-infectives (From admission, onward)   Start     Dose/Rate Route Frequency Ordered Stop   07/13/18 1900  ceFAZolin (ANCEF) IVPB 2g/100 mL premix     2 g 200 mL/hr over 30 Minutes Intravenous Every 6 hours 07/13/18 1610 07/14/18 0232   07/13/18 0600  ceFAZolin (ANCEF) 3 g in dextrose 5 % 50 mL IVPB     3  g 100 mL/hr over 30 Minutes Intravenous On call to O.R. 07/12/18 1143 07/13/18 1333     and started on DVT prophylaxis in the form of Aspirin.   PT and OT were ordered for total joint protocol. Discharge planning consulted to help with postop disposition and equipment needs. Patient had a good night on the evening of surgery. They started to get up OOB with therapy on POD #0. Hemovac drain was pulled without difficulty on day one. Continued to work with therapy into POD #2. Pt was seen during rounds on day two and was ready to go home pending progress with therapy. Dressing was changed and the incision was clean, dry, and intact with no  drainage. Pt worked with therapy for one additional session and was meeting their goals. He was discharged to home later that day in stable condition.  Diet: Diabetic diet Activity: WBAT Follow-up: in 2 weeks with Dr. Wynelle Link Disposition: Home with outpatient physical therapy at [facility]) Discharged Condition: stable   Discharge Instructions    Call MD / Call 911   Complete by:  As directed    If you experience chest pain or shortness of breath, CALL 911 and be transported to the hospital emergency room.  If you develope a fever above 101 F, pus (white drainage) or increased drainage or redness at the wound, or calf pain, call your surgeon's office.   Change dressing   Complete by:  As directed    Change the dressing daily with sterile 4 x 4 inch gauze dressing and apply TED hose.   Constipation Prevention   Complete by:  As directed    Drink plenty of fluids.  Prune juice may be helpful.  You may use a stool softener, such as Colace (over the counter) 100 mg twice a day.  Use MiraLax (over the counter) for constipation as needed.   Diet - low sodium heart healthy   Complete by:  As directed    Discharge instructions   Complete by:  As directed    Dr. Gaynelle Arabian Total Joint Specialist [facility] [address] [place], [place] [address] [phone]  TOTAL KNEE REPLACEMENT POSTOPERATIVE DIRECTIONS  Knee Rehabilitation, Guidelines Following Surgery  Results after knee surgery are often greatly improved when you follow the exercise, range of motion and muscle strengthening exercises prescribed by your doctor. Safety measures are also important to protect the knee from further injury. Any time any of these exercises cause you to have increased pain or swelling in your knee joint, decrease the amount until you are comfortable again and slowly increase them. If you have problems or questions, call your caregiver or physical therapist for advice.   HOME CARE  INSTRUCTIONS  Remove items at home which could result in a fall. This includes throw rugs or furniture in walking pathways.  ICE to the affected knee every three hours for 30 minutes at a time and then as needed for pain and swelling.  Continue to use ice on the knee for pain and swelling from surgery. You may notice swelling that will progress down to the foot and ankle.  This is normal after surgery.  Elevate the leg when you are not up walking on it.   Continue to use the breathing machine which will help keep your temperature down.  It is common for your temperature to cycle up and down following surgery, especially at night when you are not up moving around and exerting yourself.  The  breathing machine keeps your lungs expanded and your temperature down. Do not place pillow under knee, focus on keeping the knee straight while resting   DIET You may resume your previous home diet once your are discharged from the hospital.  DRESSING / WOUND CARE / SHOWERING You may shower 3 days after surgery, but keep the wounds dry during showering.  You may use an occlusive plastic wrap (Press'n Seal for example), NO SOAKING/SUBMERGING IN THE BATHTUB.  If the bandage gets wet, change with a clean dry gauze.  If the incision gets wet, pat the wound dry with a clean towel. You may start showering once you are discharged home but do not submerge the incision under water. Just pat the incision dry and apply a dry gauze dressing on daily. Change the surgical dressing daily and reapply a dry dressing each time.  ACTIVITY Walk with your walker as instructed. Use walker as long as suggested by your caregivers. Avoid periods of inactivity such as sitting longer than an hour when not asleep. This helps prevent blood clots.  You may resume a sexual relationship in one month or when given the OK by your doctor.  You may return to work once you are cleared by your doctor.  Do not drive a car for 6 weeks or until  released by you surgeon.  Do not drive while taking narcotics.  WEIGHT BEARING Weight bearing as tolerated with assist device (walker, cane, etc) as directed, use it as long as suggested by your surgeon or therapist, typically at least 4-6 weeks.  POSTOPERATIVE CONSTIPATION PROTOCOL Constipation - defined medically as fewer than three stools per week and severe constipation as less than one stool per week.  One of the most common issues patients have following surgery is constipation.  Even if you have a regular bowel pattern at home, your normal regimen is likely to be disrupted due to multiple reasons following surgery.  Combination of anesthesia, postoperative narcotics, change in appetite and fluid intake all can affect your bowels.  In order to avoid complications following surgery, here are some recommendations in order to help you during your recovery period.  Colace (docusate) - Pick up an over-the-counter form of Colace or another stool softener and take twice a day as long as you are requiring postoperative pain medications.  Take with a full glass of water daily.  If you experience loose stools or diarrhea, hold the colace until you stool forms back up.  If your symptoms do not get better within 1 week or if they get worse, check with your doctor.  Dulcolax (bisacodyl) - Pick up over-the-counter and take as directed by the product packaging as needed to assist with the movement of your bowels.  Take with a full glass of water.  Use this product as needed if not relieved by Colace only.   MiraLax (polyethylene glycol) - Pick up over-the-counter to have on hand.  MiraLax is a solution that will increase the amount of water in your bowels to assist with bowel movements.  Take as directed and can mix with a glass of water, juice, soda, coffee, or tea.  Take if you go more than two days without a movement. Do not use MiraLax more than once per day. Call your doctor if you are still constipated  or irregular after using this medication for 7 days in a row.  If you continue to have problems with postoperative constipation, please contact the office for further assistance and recommendations.  If you experience "the worst abdominal pain ever" or develop nausea or vomiting, please contact the office immediatly for further recommendations for treatment.  ITCHING  If you experience itching with your medications, try taking only a single pain pill, or even half a pain pill at a time.  You can also use Benadryl over the counter for itching or also to help with sleep.   TED HOSE STOCKINGS Wear the elastic stockings on both legs for three weeks following surgery during the day but you may remove then at night for sleeping.  MEDICATIONS See your medication summary on the "After Visit Summary" that the nursing staff will review with you prior to discharge.  You may have some home medications which will be placed on hold until you complete the course of blood thinner medication.  It is important for you to complete the blood thinner medication as prescribed by your surgeon.  Continue your approved medications as instructed at time of discharge.  PRECAUTIONS If you experience chest pain or shortness of breath - call 911 immediately for transfer to the hospital emergency department.  If you develop a fever greater that 101 F, purulent drainage from wound, increased redness or drainage from wound, foul odor from the wound/dressing, or calf pain - CONTACT YOUR SURGEON.                                                   FOLLOW-UP APPOINTMENTS Make sure you keep all of your appointments after your operation with your surgeon and caregivers. You should call the office at the above phone number and make an appointment for approximately two weeks after the date of your surgery or on the date instructed by your surgeon outlined in the "After Visit Summary".   RANGE OF MOTION AND STRENGTHENING EXERCISES    Rehabilitation of the knee is important following a knee injury or an operation. After just a few days of immobilization, the muscles of the thigh which control the knee become weakened and shrink (atrophy). Knee exercises are designed to build up the tone and strength of the thigh muscles and to improve knee motion. Often times heat used for twenty to thirty minutes before working out will loosen up your tissues and help with improving the range of motion but do not use heat for the first two weeks following surgery. These exercises can be done on a training (exercise) mat, on the floor, on a table or on a bed. Use what ever works the best and is most comfortable for you Knee exercises include:  Leg Lifts - While your knee is still immobilized in a splint or cast, you can do straight leg raises. Lift the leg to 60 degrees, hold for 3 sec, and slowly lower the leg. Repeat 10-20 times 2-3 times daily. Perform this exercise against resistance later as your knee gets better.  Quad and Hamstring Sets - Tighten up the muscle on the front of the thigh (Quad) and hold for 5-10 sec. Repeat this 10-20 times hourly. Hamstring sets are done by pushing the foot backward against an object and holding for 5-10 sec. Repeat as with quad sets.  Leg Slides: Lying on your back, slowly slide your foot toward your buttocks, bending your knee up off the floor (only go as far as is comfortable). Then slowly slide your foot back  down until your leg is flat on the floor again. Angel Wings: Lying on your back spread your legs to the side as far apart as you can without causing discomfort.  A rehabilitation program following serious knee injuries can speed recovery and prevent re-injury in the future due to weakened muscles. Contact your doctor or a physical therapist for more information on knee rehabilitation.   IF YOU ARE TRANSFERRED TO A SKILLED REHAB FACILITY If the patient is transferred to a skilled rehab facility following  release from the hospital, a list of the current medications will be sent to the facility for the patient to continue.  When discharged from the skilled rehab facility, please have the facility set up the patient's [place] prior to being released. Also, the skilled facility will be responsible for providing the patient with their medications at time of release from the facility to include their pain medication, the muscle relaxants, and their blood thinner medication. If the patient is still at the rehab facility at time of the two week follow up appointment, the skilled rehab facility will also need to assist the patient in arranging follow up appointment in our office and any transportation needs.  MAKE SURE YOU:  Understand these instructions.  Get help right away if you are not doing well or get worse.    Pick up stool softner and laxative for home use following surgery while on pain medications. Do not submerge incision under water. Please use good hand washing techniques while changing dressing each day. May shower starting three days after surgery. Please use a clean towel to pat the incision dry following showers. Continue to use ice for pain and swelling after surgery. Do not use any lotions or creams on the incision until instructed by your surgeon.   Do not put a pillow under the knee. Place it under the heel.   Complete by:  As directed    Driving restrictions   Complete by:  As directed    No driving for two weeks   TED hose   Complete by:  As directed    Use stockings (TED hose) for three weeks on both leg(s).  You may remove them at night for sleeping.   Weight bearing as tolerated   Complete by:  As directed      Allergies as of 07/15/2018   No Known Allergies     Medication List    STOP taking these medications   buPROPion 300 MG 24 hr tablet Commonly known as:  WELLBUTRIN XL   diclofenac 75 MG EC tablet Commonly known as:  VOLTAREN       TAKE these medications   albuterol 108 (90 Base) MCG/ACT inhaler Commonly known as:  PROVENTIL HFA;VENTOLIN HFA Inhale 2 puffs into the lungs every 6 (six) hours as needed for wheezing or shortness of breath.   aspirin 325 MG EC tablet Take 1 tablet (325 mg total) by mouth 2 (two) times daily for 19 days. Take one tablet (325 mg) Aspirin two times a day for three weeks following surgery. Then take one baby Aspirin (81 mg) once a day for three weeks. Then discontinue aspirin.   atorvastatin 10 MG tablet Commonly known as:  LIPITOR TAKE (1) TABLET BY MOUTH AT BEDTIME. What changed:  See the new instructions.   benazepril 20 MG tablet Commonly known as:  LOTENSIN TAKE 2 TABLETS DAILY.   gabapentin 300 MG capsule Commonly known as:  NEURONTIN TAKE 2 CAPSULES BY  MOUTH THREE TIMES A DAY. What changed:  See the new instructions.   hydrochlorothiazide 25 MG tablet Commonly known as:  HYDRODIURIL Take 1 tablet (25 mg total) by mouth daily.   metFORMIN 1000 MG tablet Commonly known as:  GLUCOPHAGE TAKE (1) TABLET BY MOUTH TWICE A DAY WITH A MEAL. What changed:    how much to take  how to take this  when to take this  additional instructions   methocarbamol 500 MG tablet Commonly known as:  ROBAXIN Take 1 tablet (500 mg total) by mouth every 6 (six) hours as needed for muscle spasms.   oxyCODONE 5 MG immediate release tablet Commonly known as:  Oxy IR/ROXICODONE Take 1-2 tablets (5-10 mg total) by mouth every 6 (six) hours as needed for moderate pain (pain score 4-6).   rOPINIRole 1 MG tablet Commonly known as:  REQUIP TAKE (2) TABLETS BY MOUTH AT BEDTIME. What changed:  See the new instructions.            Discharge Care Instructions  (From admission, onward)         Start     Ordered   07/14/18 0000  Weight bearing as tolerated     07/14/18 0809   07/14/18 0000  Change dressing    Comments:  Change the dressing daily with sterile 4 x 4 inch gauze dressing  and apply TED hose.   07/14/18 0809         Follow-up Information    Gaynelle Arabian, MD. Schedule an appointment as soon as possible for a visit on 07/28/2018.   Specialty:  Orthopedic Surgery Contact information: [address] [place] [address] [place] [address] [phone]           Signed: Theresa Duty, PA-C Orthopedic Surgery 07/20/2018, 7:37 AM

## 2018-07-21 DIAGNOSIS — M25561 Pain in right knee: Secondary | ICD-10-CM | POA: Diagnosis not present

## 2018-07-21 DIAGNOSIS — M25461 Effusion, right knee: Secondary | ICD-10-CM | POA: Diagnosis not present

## 2018-07-21 DIAGNOSIS — M25661 Stiffness of right knee, not elsewhere classified: Secondary | ICD-10-CM | POA: Diagnosis not present

## 2018-07-23 DIAGNOSIS — M25661 Stiffness of right knee, not elsewhere classified: Secondary | ICD-10-CM | POA: Diagnosis not present

## 2018-07-23 DIAGNOSIS — M25561 Pain in right knee: Secondary | ICD-10-CM | POA: Diagnosis not present

## 2018-07-23 DIAGNOSIS — M25461 Effusion, right knee: Secondary | ICD-10-CM | POA: Diagnosis not present

## 2018-07-24 DIAGNOSIS — M25661 Stiffness of right knee, not elsewhere classified: Secondary | ICD-10-CM | POA: Diagnosis not present

## 2018-07-24 DIAGNOSIS — M25561 Pain in right knee: Secondary | ICD-10-CM | POA: Diagnosis not present

## 2018-07-24 DIAGNOSIS — M25461 Effusion, right knee: Secondary | ICD-10-CM | POA: Diagnosis not present

## 2018-07-27 DIAGNOSIS — M25461 Effusion, right knee: Secondary | ICD-10-CM | POA: Diagnosis not present

## 2018-07-27 DIAGNOSIS — M25661 Stiffness of right knee, not elsewhere classified: Secondary | ICD-10-CM | POA: Diagnosis not present

## 2018-07-27 DIAGNOSIS — M25561 Pain in right knee: Secondary | ICD-10-CM | POA: Diagnosis not present

## 2018-07-31 DIAGNOSIS — M25461 Effusion, right knee: Secondary | ICD-10-CM | POA: Diagnosis not present

## 2018-07-31 DIAGNOSIS — M25661 Stiffness of right knee, not elsewhere classified: Secondary | ICD-10-CM | POA: Diagnosis not present

## 2018-07-31 DIAGNOSIS — M25561 Pain in right knee: Secondary | ICD-10-CM | POA: Diagnosis not present

## 2018-08-04 ENCOUNTER — Other Ambulatory Visit: Payer: Self-pay | Admitting: Family Medicine

## 2018-08-04 DIAGNOSIS — G629 Polyneuropathy, unspecified: Secondary | ICD-10-CM

## 2018-08-04 DIAGNOSIS — M25661 Stiffness of right knee, not elsewhere classified: Secondary | ICD-10-CM | POA: Diagnosis not present

## 2018-08-04 DIAGNOSIS — M25461 Effusion, right knee: Secondary | ICD-10-CM | POA: Diagnosis not present

## 2018-08-04 DIAGNOSIS — M25561 Pain in right knee: Secondary | ICD-10-CM | POA: Diagnosis not present

## 2018-08-05 DIAGNOSIS — M25661 Stiffness of right knee, not elsewhere classified: Secondary | ICD-10-CM | POA: Diagnosis not present

## 2018-08-05 DIAGNOSIS — M25461 Effusion, right knee: Secondary | ICD-10-CM | POA: Diagnosis not present

## 2018-08-05 DIAGNOSIS — M25561 Pain in right knee: Secondary | ICD-10-CM | POA: Diagnosis not present

## 2018-08-07 DIAGNOSIS — M25661 Stiffness of right knee, not elsewhere classified: Secondary | ICD-10-CM | POA: Diagnosis not present

## 2018-08-07 DIAGNOSIS — M25561 Pain in right knee: Secondary | ICD-10-CM | POA: Diagnosis not present

## 2018-08-07 DIAGNOSIS — M25461 Effusion, right knee: Secondary | ICD-10-CM | POA: Diagnosis not present

## 2018-08-11 DIAGNOSIS — M25461 Effusion, right knee: Secondary | ICD-10-CM | POA: Diagnosis not present

## 2018-08-11 DIAGNOSIS — M25661 Stiffness of right knee, not elsewhere classified: Secondary | ICD-10-CM | POA: Diagnosis not present

## 2018-08-11 DIAGNOSIS — M25561 Pain in right knee: Secondary | ICD-10-CM | POA: Diagnosis not present

## 2018-08-12 DIAGNOSIS — M25661 Stiffness of right knee, not elsewhere classified: Secondary | ICD-10-CM | POA: Diagnosis not present

## 2018-08-12 DIAGNOSIS — M25561 Pain in right knee: Secondary | ICD-10-CM | POA: Diagnosis not present

## 2018-08-12 DIAGNOSIS — M25461 Effusion, right knee: Secondary | ICD-10-CM | POA: Diagnosis not present

## 2018-08-14 ENCOUNTER — Other Ambulatory Visit: Payer: Self-pay | Admitting: Family Medicine

## 2018-08-14 DIAGNOSIS — G4733 Obstructive sleep apnea (adult) (pediatric): Secondary | ICD-10-CM | POA: Diagnosis not present

## 2018-08-20 ENCOUNTER — Encounter: Payer: Self-pay | Admitting: Family Medicine

## 2018-08-20 ENCOUNTER — Ambulatory Visit (INDEPENDENT_AMBULATORY_CARE_PROVIDER_SITE_OTHER): Payer: 59 | Admitting: Family Medicine

## 2018-08-20 VITALS — BP 92/58 | HR 101 | Temp 97.7°F | Resp 18 | Ht 69.0 in | Wt 258.0 lb

## 2018-08-20 DIAGNOSIS — E1140 Type 2 diabetes mellitus with diabetic neuropathy, unspecified: Secondary | ICD-10-CM

## 2018-08-20 DIAGNOSIS — A084 Viral intestinal infection, unspecified: Secondary | ICD-10-CM | POA: Diagnosis not present

## 2018-08-20 DIAGNOSIS — K921 Melena: Secondary | ICD-10-CM | POA: Diagnosis not present

## 2018-08-20 LAB — COMPLETE METABOLIC PANEL WITH GFR
AG Ratio: 1.4 (calc) (ref 1.0–2.5)
ALT: 13 U/L (ref 9–46)
AST: 16 U/L (ref 10–35)
Albumin: 3.6 g/dL (ref 3.6–5.1)
Alkaline phosphatase (APISO): 77 U/L (ref 40–115)
BUN/Creatinine Ratio: 21 (calc) (ref 6–22)
BUN: 31 mg/dL — ABNORMAL HIGH (ref 7–25)
CO2: 26 mmol/L (ref 20–32)
Calcium: 8.7 mg/dL (ref 8.6–10.3)
Chloride: 97 mmol/L — ABNORMAL LOW (ref 98–110)
Creat: 1.49 mg/dL — ABNORMAL HIGH (ref 0.70–1.33)
GFR, Est African American: 59 mL/min/{1.73_m2} — ABNORMAL LOW (ref >=60)
GFR, Est Non African American: 51 mL/min/{1.73_m2} — ABNORMAL LOW (ref >=60)
Globulin: 2.5 g/dL (calc) (ref 1.9–3.7)
Glucose, Bld: 106 mg/dL — ABNORMAL HIGH (ref 65–99)
Potassium: 4 mmol/L (ref 3.5–5.3)
Sodium: 134 mmol/L — ABNORMAL LOW (ref 135–146)
Total Bilirubin: 0.5 mg/dL (ref 0.2–1.2)
Total Protein: 6.1 g/dL (ref 6.1–8.1)

## 2018-08-20 LAB — CBC WITH DIFFERENTIAL/PLATELET
Absolute Monocytes: 1321 cells/uL — ABNORMAL HIGH (ref 200–950)
BASOS PCT: 0.5 %
Basophils Absolute: 52 cells/uL (ref 0–200)
Eosinophils Absolute: 749 cells/uL — ABNORMAL HIGH (ref 15–500)
Eosinophils Relative: 7.2 %
HEMATOCRIT: 40.9 % (ref 38.5–50.0)
Hemoglobin: 14.1 g/dL (ref 13.2–17.1)
LYMPHS ABS: 2725 {cells}/uL (ref 850–3900)
MCH: 30 pg (ref 27.0–33.0)
MCHC: 34.5 g/dL (ref 32.0–36.0)
MCV: 87 fL (ref 80.0–100.0)
MPV: 9.6 fL (ref 7.5–12.5)
Monocytes Relative: 12.7 %
Neutro Abs: 5554 cells/uL (ref 1500–7800)
Neutrophils Relative %: 53.4 %
Platelets: 296 10*3/uL (ref 140–400)
RBC: 4.7 10*6/uL (ref 4.20–5.80)
RDW: 12.4 % (ref 11.0–15.0)
Total Lymphocyte: 26.2 %
WBC: 10.4 10*3/uL (ref 3.8–10.8)

## 2018-08-20 MED ORDER — PROMETHAZINE HCL 25 MG PO TABS: 25.0000 mg | ORAL_TABLET | Freq: Three times a day (TID) | ORAL | 0 refills | Status: DC | PRN

## 2018-08-20 NOTE — Progress Notes (Signed)
Subjective:    Patient ID: Jason French, male    DOB: 12-23-1960, 58 y.o.   MRN: [id number]  Medication Refill    11/2017 Patient is here today for follow-up of his chronic medical conditions.  His medical conditions include hypertension, hyperlipidemia, hypertriglyceridemia, and type 2 diabetes mellitus non-insulin-dependent.  He also has peripheral neuropathy in both legs as well as restless leg syndrome.  He also continues to smoke 1 pack of cigarettes per day.  He denies any chest pain shortness of breath or dyspnea on exertion.  He denies any myalgias or right upper quadrant pain.  He denies any polyuria, polydipsia, or blurry vision.  Patient underwent knee arthroscopy in November 2018 for meniscal tear.  Despite undergoing surgical correction for this, he continues to have chronic pain in his knee.  His orthopedist is recommended a knee replacement.  He is requesting to discuss with another orthopedist to get a second opinion prior to undergoing surgery.  While he is very comfortable with his orthopedist, he is very hesitant to pursue surgery and would like a second opinion prior to proceeding.  His most recent lab work as listed below.  Labs are significant for a hemoglobin A1c of 6.5 which is excellent, and LDL cholesterol well below 100 which is outstanding, however a triglyceride level greater than 300.  Patient does not eat breakfast.  He eats cheese crackers throughout the day and junk food while working.  At night he tends to eat meals that are high in protein saturated fat and carbohydrates.  He seldom eats vegetables or fruits.  For instance, when I asked him to name his last 3 meals, he mentions pizza, steak, and hamburger.  At that time, my plan was: I will happily consult orthopedic surgery for a second opinion.  Regarding his medical conditions, his blood pressure today is well controlled at 110/72.  His LDL cholesterol is excellent.  I am very happy with his hemoglobin A1c.  However  his HDL cholesterol is extremely low his triglycerides are extremely high.  His weight is well above his goal weight and he continues to smoke.  These issues increases risk for cardiovascular disease.  Furthermore, I can hear faint wheezes on exam and I suspect that the patient may be starting to develop signs of emphysema.  I have strongly recommended smoking cessation.  Patient cannot tolerate Chantix and has seen no benefit on Wellbutrin.  I have recommended aggressive dietary changes.  I recommended a diet rich in fruits and vegetables.  I recommended discontinuation of junk food, reduction in protein and carbohydrates and saturated fat.  Patient also needs to engage in regular aerobic exercise ideally for 30 minutes a day 5 days a week.  I would encourage him to exercise in any way to his body will tolerate such as swimming, elliptical machine, stationary bike.  I will make no changes in his medication at this time.  Follow-up in 6 months  05/14/18 Patient is here today for surgical clearance for his upcoming knee surgery.  He states that his fasting blood sugars are averaging between 80 and 130.  He states that his 2-hour postprandial sugars are randomly checks but he has not seen anything over 200.  He denies any hypoglycemic episodes.  He denies any chest pain shortness of breath or dyspnea on exertion.  He denies any myalgias or right upper quadrant pain.  He has had a cough recently and on his physical exam, there are right basilar crackles.  He also smokes.  He denies any purulent sputum, chest pain or fevers.  Patient has a history of obstructive sleep apnea.  He is currently on CPAP at 14 cm water.  He states that he goes to bed every night at 930 and wakes up the following morning around 530.  He wears it every night throughout the night.  He is compliant.  He sees tremendous benefit from his CPAP machine.  However his machine is malfunctioning and he is requiring a new prescription.  His  respiratory therapist has recommended an auto titrate CPAP.  At that time, my plan was: Patient's blood pressures well controlled.  I recommended that he decrease hydrochlorothiazide to 12.5 mg a day due to relative hypotension.  I will check a CBC, and CMP for preoperative surgical clearance.  I will check a fasting lipid panel.  Goal LDL cholesterol is less than 100.  I will check a hemoglobin A1c.  Goal hemoglobin A1c is less than 6.5.  I will check for diabetic nephropathy with a urine microalbumin.  I recommended a colonoscopy but the patient defers that at the present time.  We did discuss Cologuard.  I will screen for prostate cancer with a PSA.  Given his abnormal breath sounds and his smoking history, I did recommend a chest x-ray.  Barring any abnormalities on his lab work or x-ray he is cleared to proceed with surgery.  Patient needs CPAP auto titrate.  Please see the history of present illness which documents his compliance and benefit.  Continue to recommend diet exercise weight loss.   08/20/18 Patient reports 1 week of symptoms.  He started a fever at the end of last week or over the weekend.  Fever has been as high as 101.  He reports nausea and vomiting.  He also reports 4-5 episodes of watery diarrhea per day.  The stool is black and tarry.  He denies any abdominal pain.  He denies any hematemesis.  However he is been able to eat very little.  His blood pressure as a result is low at 92/58.  He does report orthostatic dizziness.  He has been afebrile now for 48 hours.  The nausea has improved.  He has not vomited although he still feels extremely nauseated and unable to eat.  The diarrhea is gradually starting to improve.  The black stools have improved slightly and are starting to clear up.  Of note he is also taking Pepto-Bismol.  He also reports worsening diabetic neuropathy uncontrolled with gabapentin.  He reports burning and stinging pain in both feet.  Past Medical History:  Diagnosis  Date  . Arthritis   . Cancer [facility])    adrenal cancer  . Diabetes mellitus without complication ([place])   . Diverticulitis   . Hyperlipidemia   . Hypertension   . Neuropathy   . RLS (restless legs syndrome)   . Sleep apnea    use CPAP   Past Surgical History:  Procedure Laterality Date  . COLON SURGERY     ruptured diverticulosis  . KNEE ARTHROSCOPY WITH MEDIAL MENISECTOMY Right 06/12/2017   Procedure: KNEE ARTHROSCOPY WITH PARTIAL MEDIAL MENISECTOMY;  Surgeon: [name], MD;  Location: [facility];  Service: Orthopedics;  Laterality: Right;  . RESECTION OF ABDOMINAL MASS     adrenal mass right (cancer)  . TOTAL KNEE ARTHROPLASTY Right 07/13/2018   Procedure: RIGHT TOTAL KNEE ARTHROPLASTY,INJECTION OF LEFT KNEE;  Surgeon: Gaynelle Arabian, MD;  Location: [facility];  Service: Orthopedics;  Laterality: Right;  10min   Current Outpatient Medications on File Prior to Visit  Medication Sig Dispense Refill  . albuterol (PROVENTIL HFA;VENTOLIN HFA) 108 (90 Base) MCG/ACT inhaler Inhale 2 puffs into the lungs every 6 (six) hours as needed for wheezing or shortness of breath. 1 Inhaler 0  . atorvastatin (LIPITOR) 10 MG tablet TAKE (1) TABLET BY MOUTH AT BEDTIME. 30 tablet 0  . benazepril (LOTENSIN) 20 MG tablet TAKE 2 TABLETS DAILY. (Patient taking differently: Take 40 mg by mouth daily. ) 60 tablet 2  . gabapentin (NEURONTIN) 300 MG capsule Take 2 capsules (600 mg total) by mouth 3 (three) times daily. 180 capsule 3  . hydrochlorothiazide (HYDRODIURIL) 25 MG tablet Take 1 tablet (25 mg total) by mouth daily. 90 tablet 3  . metFORMIN (GLUCOPHAGE) 1000 MG tablet TAKE (1) TABLET BY MOUTH TWICE A DAY WITH A MEAL. (Patient taking differently: Take 1,000 mg by mouth 2 (two) times daily with a meal. ) 180 tablet 1  . methocarbamol (ROBAXIN) 500 MG tablet Take 1 tablet (500 mg total) by mouth every 6 (six) hours as needed for muscle spasms. 40 tablet 0  . oxyCODONE (OXY IR/ROXICODONE) 5 MG immediate  release tablet Take 1-2 tablets (5-10 mg total) by mouth every 6 (six) hours as needed for moderate pain (pain score 4-6). 56 tablet 0  . rOPINIRole (REQUIP) 1 MG tablet TAKE (2) TABLETS BY MOUTH AT BEDTIME. (Patient taking differently: Take 2 mg by mouth at bedtime. ) 60 tablet 3   No current facility-administered medications on file prior to visit.    No Known Allergies Social History   Socioeconomic History  . Marital status: Married    Spouse name: Not on file  . Number of children: Not on file  . Years of education: Not on file  . Highest education level: Not on file  Occupational History  . Not on file  Social Needs  . Financial resource strain: Not on file  . Food insecurity:    Worry: Not on file    Inability: Not on file  . Transportation needs:    Medical: Not on file    Non-medical: Not on file  Tobacco Use  . Smoking status: Current Every Day Smoker    Packs/day: 0.50    Years: 40.00    Pack years: 20.00    Types: Cigarettes  . Smokeless tobacco: Never Used  Substance and Sexual Activity  . Alcohol use: Yes    Comment: social  . Drug use: No  . Sexual activity: Yes  Lifestyle  . Physical activity:    Days per week: Not on file    Minutes per session: Not on file  . Stress: Not on file  Relationships  . Social connections:    Talks on phone: Not on file    Gets together: Not on file    Attends religious service: Not on file    Active member of club or organization: Not on file    Attends meetings of clubs or organizations: Not on file    Relationship status: Not on file  . Intimate partner violence:    Fear of current or ex partner: Not on file    Emotionally abused: Not on file    Physically abused: Not on file    Forced sexual activity: Not on file  Other Topics Concern  . Not on file  Social History Narrative  . Not on file      Review of Systems  All other systems reviewed and are negative.      Objective:   Physical Exam    Constitutional: He appears well-developed and well-nourished. No distress.  HENT:  Head: Normocephalic and atraumatic.  Right Ear: External ear normal.  Left Ear: External ear normal.  Nose: Nose normal.  Mouth/Throat: Oropharynx is clear and moist. No oropharyngeal exudate.  Eyes: Conjunctivae are normal. Right eye exhibits no discharge. Left eye exhibits no discharge. No scleral icterus.  Cardiovascular: Normal rate, regular rhythm, normal heart sounds and intact distal pulses. Exam reveals no gallop and no friction rub.  No murmur heard. Pulmonary/Chest: Effort normal and breath sounds normal. No respiratory distress. He has no wheezes. He has no rales. He exhibits no tenderness.  Abdominal: Soft. Bowel sounds are normal. He exhibits no distension and no mass. There is no abdominal tenderness. There is no rebound and no guarding.  Musculoskeletal: Normal range of motion.        General: No edema.  Lymphadenopathy:    He has no cervical adenopathy.  Skin: Skin is warm. No rash noted. He is not diaphoretic. No erythema. No pallor.  Vitals reviewed.         Assessment & Plan:  Viral gastroenteritis Black stool - Plan: COMPLETE METABOLIC PANEL WITH GFR, CBC with Differential/Platelet, Fecal Globin By Immunochemistry  Diabetic neuropathy   I believe the patient has viral gastroenteritis that is slowly improving.  His wife had similar symptoms.  Symptoms have been present since the weekend but are gradually starting to improve.  I have recommended Phenergan 25 mg every 6 hours as needed for nausea and vomiting.  I recommended a brat diet and pushing fluids.  I anticipate that the illness will gradually improve over the next 3 days.  I am concerned about the black tarry stools as being a sign of possible GI bleeding.  Therefore I will check a CBC to evaluate for severe anemia.  I will also check a stool for blood as it is possible this could be due to Pepto-Bismol or the virus that he is  experiencing.  If there is a significant drop in his hemoglobin, he will need endoscopy urgently.  Meanwhile temporarily discontinue benazepril and hydrochlorothiazide and focus on hydration.  Recheck next week.  Once the patient is symptomatically improved, we will add Cymbalta to gabapentin for his diabetic neuropathy

## 2018-08-21 ENCOUNTER — Other Ambulatory Visit: Payer: Self-pay | Admitting: Family Medicine

## 2018-08-21 DIAGNOSIS — E860 Dehydration: Secondary | ICD-10-CM

## 2018-08-25 DIAGNOSIS — Z96651 Presence of right artificial knee joint: Secondary | ICD-10-CM | POA: Diagnosis not present

## 2018-08-27 ENCOUNTER — Other Ambulatory Visit: Payer: 59

## 2018-08-27 ENCOUNTER — Telehealth: Payer: Self-pay | Admitting: Family Medicine

## 2018-08-27 ENCOUNTER — Other Ambulatory Visit: Payer: Self-pay | Admitting: Family Medicine

## 2018-08-27 DIAGNOSIS — E860 Dehydration: Secondary | ICD-10-CM | POA: Diagnosis not present

## 2018-08-27 LAB — BASIC METABOLIC PANEL
BUN: 12 mg/dL (ref 7–25)
CO2: 29 mmol/L (ref 20–32)
CREATININE: 1.1 mg/dL (ref 0.70–1.33)
Calcium: 9.8 mg/dL (ref 8.6–10.3)
Chloride: 103 mmol/L (ref 98–110)
Glucose, Bld: 74 mg/dL (ref 65–99)
Potassium: 4.8 mmol/L (ref 3.5–5.3)
Sodium: 140 mmol/L (ref 135–146)

## 2018-08-27 MED ORDER — DULOXETINE HCL 60 MG PO CPEP: 60.0000 mg | ORAL_CAPSULE | Freq: Every day | ORAL | 3 refills | Status: DC

## 2018-08-27 NOTE — Telephone Encounter (Signed)
I will add cymbalta 60 mg a day.

## 2018-08-27 NOTE — Telephone Encounter (Signed)
Pt wanted to know if you could go ahead and send in the medication for his dm neuropathy as he is feeling better? (Looks like Cymbalta is what you were going to start).   Also his BP readings are on your desk.

## 2018-08-28 NOTE — Telephone Encounter (Addendum)
Pt aware via vm 

## 2018-09-01 DIAGNOSIS — M25461 Effusion, right knee: Secondary | ICD-10-CM | POA: Diagnosis not present

## 2018-09-01 DIAGNOSIS — M25661 Stiffness of right knee, not elsewhere classified: Secondary | ICD-10-CM | POA: Diagnosis not present

## 2018-09-01 DIAGNOSIS — M25561 Pain in right knee: Secondary | ICD-10-CM | POA: Diagnosis not present

## 2018-09-04 DIAGNOSIS — M25461 Effusion, right knee: Secondary | ICD-10-CM | POA: Diagnosis not present

## 2018-09-04 DIAGNOSIS — M25661 Stiffness of right knee, not elsewhere classified: Secondary | ICD-10-CM | POA: Diagnosis not present

## 2018-09-04 DIAGNOSIS — M25561 Pain in right knee: Secondary | ICD-10-CM | POA: Diagnosis not present

## 2018-09-08 DIAGNOSIS — M25661 Stiffness of right knee, not elsewhere classified: Secondary | ICD-10-CM | POA: Diagnosis not present

## 2018-09-08 DIAGNOSIS — M25461 Effusion, right knee: Secondary | ICD-10-CM | POA: Diagnosis not present

## 2018-09-08 DIAGNOSIS — M25561 Pain in right knee: Secondary | ICD-10-CM | POA: Diagnosis not present

## 2018-09-10 DIAGNOSIS — M25561 Pain in right knee: Secondary | ICD-10-CM | POA: Diagnosis not present

## 2018-09-10 DIAGNOSIS — M25661 Stiffness of right knee, not elsewhere classified: Secondary | ICD-10-CM | POA: Diagnosis not present

## 2018-09-10 DIAGNOSIS — M25461 Effusion, right knee: Secondary | ICD-10-CM | POA: Diagnosis not present

## 2018-09-15 ENCOUNTER — Other Ambulatory Visit: Payer: Self-pay | Admitting: Family Medicine

## 2018-09-15 DIAGNOSIS — M25461 Effusion, right knee: Secondary | ICD-10-CM | POA: Diagnosis not present

## 2018-09-15 DIAGNOSIS — M25561 Pain in right knee: Secondary | ICD-10-CM | POA: Diagnosis not present

## 2018-09-15 DIAGNOSIS — M25661 Stiffness of right knee, not elsewhere classified: Secondary | ICD-10-CM | POA: Diagnosis not present

## 2018-09-15 MED ORDER — METFORMIN HCL 1000 MG PO TABS: ORAL_TABLET | ORAL | 1 refills | Status: DC

## 2018-09-17 DIAGNOSIS — M25661 Stiffness of right knee, not elsewhere classified: Secondary | ICD-10-CM | POA: Diagnosis not present

## 2018-09-17 DIAGNOSIS — M25461 Effusion, right knee: Secondary | ICD-10-CM | POA: Diagnosis not present

## 2018-09-17 DIAGNOSIS — M25561 Pain in right knee: Secondary | ICD-10-CM | POA: Diagnosis not present

## 2018-09-18 ENCOUNTER — Other Ambulatory Visit: Payer: Self-pay | Admitting: Family Medicine

## 2018-10-05 ENCOUNTER — Other Ambulatory Visit: Payer: Self-pay | Admitting: Family Medicine

## 2018-11-03 ENCOUNTER — Other Ambulatory Visit: Payer: Self-pay | Admitting: Family Medicine

## 2018-11-23 ENCOUNTER — Other Ambulatory Visit: Payer: Self-pay | Admitting: Family Medicine

## 2018-11-30 ENCOUNTER — Other Ambulatory Visit: Payer: Self-pay | Admitting: Family Medicine

## 2018-11-30 DIAGNOSIS — G629 Polyneuropathy, unspecified: Secondary | ICD-10-CM

## 2018-12-10 DIAGNOSIS — Z471 Aftercare following joint replacement surgery: Secondary | ICD-10-CM | POA: Diagnosis not present

## 2018-12-10 DIAGNOSIS — Z96651 Presence of right artificial knee joint: Secondary | ICD-10-CM | POA: Diagnosis not present

## 2018-12-26 ENCOUNTER — Other Ambulatory Visit: Payer: Self-pay | Admitting: Family Medicine

## 2018-12-30 ENCOUNTER — Other Ambulatory Visit: Payer: Self-pay | Admitting: Family Medicine

## 2019-01-18 ENCOUNTER — Ambulatory Visit (INDEPENDENT_AMBULATORY_CARE_PROVIDER_SITE_OTHER): Payer: 59 | Admitting: Family Medicine

## 2019-01-18 ENCOUNTER — Telehealth: Payer: Self-pay | Admitting: *Deleted

## 2019-01-18 ENCOUNTER — Other Ambulatory Visit: Payer: Self-pay

## 2019-01-18 ENCOUNTER — Other Ambulatory Visit: Payer: 59

## 2019-01-18 DIAGNOSIS — Z20822 Contact with and (suspected) exposure to covid-19: Secondary | ICD-10-CM

## 2019-01-18 DIAGNOSIS — J069 Acute upper respiratory infection, unspecified: Secondary | ICD-10-CM

## 2019-01-18 NOTE — Progress Notes (Signed)
Subjective:    Patient ID: Jason French, male    DOB: 1961/02/11, 58 y.o.   MRN: [id number]  HPI  Patient is currently being seen today as a telephone visit.  He is currently at home.  I am currently my office.  Phone call began at 1007.  Phone call concluded at 1017. Patient reports a sore throat that began Saturday along with a cough that began Saturday.  Patient states that the sore throat is gradually worsened.  He now states that it hurts to even turn his head to look up.  He is had subjective fevers.  His temperature ranges 98.7 to a maximum temperature of 99.1 past medical history significant for diabetes along with smoking.  I asked the patient to examine his throat.  He looked in the mirror.  He states he sees no erythema in his posterior oropharynx.  He sees no exudate or vesicles or pus. Past Medical History:  Diagnosis Date  . Arthritis   . Cancer [facility])    adrenal cancer  . Diabetes mellitus without complication ([place])   . Diverticulitis   . Hyperlipidemia   . Hypertension   . Neuropathy   . RLS (restless legs syndrome)   . Sleep apnea    use CPAP   Past Surgical History:  Procedure Laterality Date  . COLON SURGERY     ruptured diverticulosis  . KNEE ARTHROSCOPY WITH MEDIAL MENISECTOMY Right 06/12/2017   Procedure: KNEE ARTHROSCOPY WITH PARTIAL MEDIAL MENISECTOMY;  Surgeon: [name], MD;  Location: [facility];  Service: Orthopedics;  Laterality: Right;  . RESECTION OF ABDOMINAL MASS     adrenal mass right (cancer)  . TOTAL KNEE ARTHROPLASTY Right 07/13/2018   Procedure: RIGHT TOTAL KNEE ARTHROPLASTY,INJECTION OF LEFT KNEE;  Surgeon: Gaynelle Arabian, MD;  Location: [facility];  Service: Orthopedics;  Laterality: Right;  82min   Current Outpatient Medications on File Prior to Visit  Medication Sig Dispense Refill  . albuterol (PROVENTIL HFA;VENTOLIN HFA) 108 (90 Base) MCG/ACT inhaler Inhale 2 puffs into the lungs every 6 (six) hours as needed for wheezing or shortness  of breath. 1 Inhaler 0  . atorvastatin (LIPITOR) 10 MG tablet TAKE (1) TABLET BY MOUTH AT BEDTIME. 30 tablet 5  . benazepril (LOTENSIN) 20 MG tablet TAKE 2 TABLETS DAILY. 60 tablet 3  . DULoxetine (CYMBALTA) 60 MG capsule TAKE (1) CAPSULE BY MOUTH ONCE DAILY. 30 capsule 0  . gabapentin (NEURONTIN) 300 MG capsule TAKE 2 CAPSULES BY MOUTH THREE TIMES A DAY. 180 capsule 3  . hydrochlorothiazide (HYDRODIURIL) 25 MG tablet Take 1 tablet (25 mg total) by mouth daily. 90 tablet 3  . metFORMIN (GLUCOPHAGE) 1000 MG tablet TAKE (1) TABLET BY MOUTH TWICE A DAY WITH A MEAL. 180 tablet 1  . methocarbamol (ROBAXIN) 500 MG tablet Take 1 tablet (500 mg total) by mouth every 6 (six) hours as needed for muscle spasms. 40 tablet 0  . promethazine (PHENERGAN) 25 MG tablet Take 1 tablet (25 mg total) by mouth every 8 (eight) hours as needed for nausea or vomiting. 20 tablet 0  . rOPINIRole (REQUIP) 1 MG tablet TAKE (2) TABLETS BY MOUTH AT BEDTIME. 60 tablet 0   No current facility-administered medications on file prior to visit.    No Known Allergies Social History   Socioeconomic History  . Marital status: Married    Spouse name: Not on file  . Number of children: Not on file  . Years of education: Not on file  .  Highest education level: Not on file  Occupational History  . Not on file  Social Needs  . Financial resource strain: Not on file  . Food insecurity    Worry: Not on file    Inability: Not on file  . Transportation needs    Medical: Not on file    Non-medical: Not on file  Tobacco Use  . Smoking status: Current Every Day Smoker    Packs/day: 0.50    Years: 40.00    Pack years: 20.00    Types: Cigarettes  . Smokeless tobacco: Never Used  Substance and Sexual Activity  . Alcohol use: Yes    Comment: social  . Drug use: No  . Sexual activity: Yes  Lifestyle  . Physical activity    Days per week: Not on file    Minutes per session: Not on file  . Stress: Not on file  Relationships   . Social Herbalist on phone: Not on file    Gets together: Not on file    Attends religious service: Not on file    Active member of club or organization: Not on file    Attends meetings of clubs or organizations: Not on file    Relationship status: Not on file  . Intimate partner violence    Fear of current or ex partner: Not on file    Emotionally abused: Not on file    Physically abused: Not on file    Forced sexual activity: Not on file  Other Topics Concern  . Not on file  Social History Narrative  . Not on file     Review of Systems  All other systems reviewed and are negative.      Objective:   Physical Exam  No physical exam could be performed today as the patient was seen as a telephone visit.  However the patient did check his temperature while was talking to him over the phone and it was 98.7      Assessment & Plan:  1. URI, acute Patient symptoms are consistent with a viral upper respiratory infection.  However given the current pandemic, and his complicated medical history I have recommended checking the patient for COVID-19.  I will contact the patient education center and try to arrange outpatient testing through the [place].  Meanwhile, I have recommended the patient self quarantine at home and avoid contact with any other people including his wife who is a cancer patient.

## 2019-01-18 NOTE — Telephone Encounter (Signed)
-----   Message from Susy Frizzle, MD sent at 01/18/2019 10:14 AM EDT ----- Patient needs COVID testing.  Smoker, diabetic, with history of adrenal cancer who has cough fever and sore throat.  Please schedule and let me know if there is anything else I need to do to help.  Thank you Gershon Mussel

## 2019-01-23 LAB — NOVEL CORONAVIRUS, NAA: SARS-CoV-2, NAA: NOT DETECTED

## 2019-01-26 ENCOUNTER — Other Ambulatory Visit: Payer: Self-pay | Admitting: Family Medicine

## 2019-02-15 ENCOUNTER — Other Ambulatory Visit: Payer: Self-pay | Admitting: Family Medicine

## 2019-03-17 ENCOUNTER — Other Ambulatory Visit: Payer: Self-pay | Admitting: Family Medicine

## 2019-04-06 ENCOUNTER — Other Ambulatory Visit: Payer: Self-pay | Admitting: Family Medicine

## 2019-04-09 ENCOUNTER — Other Ambulatory Visit: Payer: Self-pay | Admitting: Family Medicine

## 2019-04-09 DIAGNOSIS — G629 Polyneuropathy, unspecified: Secondary | ICD-10-CM

## 2019-04-17 ENCOUNTER — Other Ambulatory Visit: Payer: Self-pay | Admitting: Family Medicine

## 2019-04-30 ENCOUNTER — Other Ambulatory Visit: Payer: Self-pay | Admitting: Family Medicine

## 2019-05-04 ENCOUNTER — Other Ambulatory Visit: Payer: Self-pay | Admitting: Family Medicine

## 2019-05-07 ENCOUNTER — Other Ambulatory Visit: Payer: Self-pay | Admitting: Family Medicine

## 2019-05-13 ENCOUNTER — Other Ambulatory Visit: Payer: Self-pay | Admitting: Family Medicine

## 2019-05-13 DIAGNOSIS — G629 Polyneuropathy, unspecified: Secondary | ICD-10-CM

## 2019-06-04 ENCOUNTER — Other Ambulatory Visit: Payer: Self-pay | Admitting: Family Medicine

## 2019-06-10 ENCOUNTER — Other Ambulatory Visit: Payer: Self-pay

## 2019-06-10 ENCOUNTER — Ambulatory Visit (INDEPENDENT_AMBULATORY_CARE_PROVIDER_SITE_OTHER): Payer: 59 | Admitting: Family Medicine

## 2019-06-10 ENCOUNTER — Encounter: Payer: Self-pay | Admitting: Family Medicine

## 2019-06-10 VITALS — BP 132/74 | HR 92 | Temp 98.6°F | Resp 16 | Ht 69.0 in | Wt 286.0 lb

## 2019-06-10 DIAGNOSIS — E7800 Pure hypercholesterolemia, unspecified: Secondary | ICD-10-CM

## 2019-06-10 DIAGNOSIS — G629 Polyneuropathy, unspecified: Secondary | ICD-10-CM

## 2019-06-10 DIAGNOSIS — E1140 Type 2 diabetes mellitus with diabetic neuropathy, unspecified: Secondary | ICD-10-CM

## 2019-06-10 DIAGNOSIS — Z23 Encounter for immunization: Secondary | ICD-10-CM

## 2019-06-10 DIAGNOSIS — N189 Chronic kidney disease, unspecified: Secondary | ICD-10-CM

## 2019-06-10 DIAGNOSIS — I10 Essential (primary) hypertension: Secondary | ICD-10-CM | POA: Diagnosis not present

## 2019-06-10 MED ORDER — DULOXETINE HCL 60 MG PO CPEP: ORAL_CAPSULE | ORAL | 3 refills | Status: DC

## 2019-06-10 MED ORDER — BENAZEPRIL HCL 40 MG PO TABS: 40.0000 mg | ORAL_TABLET | Freq: Every day | ORAL | 3 refills | Status: DC

## 2019-06-10 MED ORDER — ATORVASTATIN CALCIUM 10 MG PO TABS: ORAL_TABLET | ORAL | 3 refills | Status: DC

## 2019-06-10 MED ORDER — GABAPENTIN 300 MG PO CAPS: ORAL_CAPSULE | ORAL | 3 refills | Status: DC

## 2019-06-10 MED ORDER — ROPINIROLE HCL 1 MG PO TABS: ORAL_TABLET | ORAL | 3 refills | Status: DC

## 2019-06-10 MED ORDER — METFORMIN HCL 1000 MG PO TABS: 1000.0000 mg | ORAL_TABLET | Freq: Two times a day (BID) | ORAL | 3 refills | Status: DC

## 2019-06-10 MED ORDER — HYDROCHLOROTHIAZIDE 25 MG PO TABS: ORAL_TABLET | ORAL | 3 refills | Status: DC

## 2019-06-10 NOTE — Progress Notes (Signed)
Subjective:    Patient ID: Jason French, male    DOB: 1960-12-26, 58 y.o.   MRN: [id number]  Medication Refill  Patient is here today for a follow-up of his chronic medical conditions.  His blood pressure today is well controlled at 132/74.  He denies any chest pain shortness of breath or dyspnea on exertion.  Unfortunately he has gained weight recently.  He has a history of diabetes mellitus with neuropathy.  He does report burning and stinging and tingling in his feet despite taking gabapentin and Cymbalta.  When asking about his sugars, he is unable to provide any numbers for me to review.  He states that his sugar control has been "failure".  However he does not have any idea of a ballpark value of his blood sugars.  He denies any hypoglycemia.  He does not want to try Jardiance due to polyuria.  He does not want to try glipizide or Actos due to weight gain however he would be interested in adding Trulicity to Metformin if his hemoglobin A1c dictates that.  He denies any blurry vision and he recently had a diabetic eye exam that was normal.  Diabetic foot exam was performed today and was significant for neuropathic findings in his toes.  He denies any myalgias or right upper quadrant pain on a statin.  He is due for a colonoscopy which she refuses but he would be willing to consent to Cologuard.  He refuses a flu shot today however he does consent to Pneumovax 23.  He declines HIV and hepatitis C screening  Past Medical History:  Diagnosis Date  . Arthritis   . Cancer [facility])    adrenal cancer  . Diabetes mellitus without complication ([place])   . Diverticulitis   . Hyperlipidemia   . Hypertension   . Neuropathy   . RLS (restless legs syndrome)   . Sleep apnea    use CPAP   Past Surgical History:  Procedure Laterality Date  . COLON SURGERY     ruptured diverticulosis  . KNEE ARTHROSCOPY WITH MEDIAL MENISECTOMY Right 06/12/2017   Procedure: KNEE ARTHROSCOPY WITH PARTIAL MEDIAL  MENISECTOMY;  Surgeon: [name], MD;  Location: [facility];  Service: Orthopedics;  Laterality: Right;  . RESECTION OF ABDOMINAL MASS     adrenal mass right (cancer)  . TOTAL KNEE ARTHROPLASTY Right 07/13/2018   Procedure: RIGHT TOTAL KNEE ARTHROPLASTY,INJECTION OF LEFT KNEE;  Surgeon: Gaynelle Arabian, MD;  Location: [facility];  Service: Orthopedics;  Laterality: Right;  68min   Current Outpatient Medications on File Prior to Visit  Medication Sig Dispense Refill  . atorvastatin (LIPITOR) 10 MG tablet TAKE (1) TABLET BY MOUTH AT BEDTIME. 30 tablet 3  . benazepril (LOTENSIN) 20 MG tablet TAKE 2 TABLETS DAILY. 60 tablet 0  . DULoxetine (CYMBALTA) 60 MG capsule TAKE (1) CAPSULE BY MOUTH ONCE DAILY. 30 capsule 3  . gabapentin (NEURONTIN) 300 MG capsule TAKE 2 CAPSULES BY MOUTH THREE TIMES A DAY. 180 capsule 3  . hydrochlorothiazide (HYDRODIURIL) 25 MG tablet TAKE (1) TABLET BY MOUTH ONCE DAILY. 30 tablet 0  . metFORMIN (GLUCOPHAGE) 1000 MG tablet TAKE (1) TABLET BY MOUTH TWICE A DAY WITH A MEAL. 180 tablet 0  . methocarbamol (ROBAXIN) 500 MG tablet Take 1 tablet (500 mg total) by mouth every 6 (six) hours as needed for muscle spasms. 40 tablet 0  . rOPINIRole (REQUIP) 1 MG tablet TAKE (2) TABLETS BY MOUTH AT BEDTIME. 180 tablet 3  . albuterol (  PROVENTIL HFA;VENTOLIN HFA) 108 (90 Base) MCG/ACT inhaler Inhale 2 puffs into the lungs every 6 (six) hours as needed for wheezing or shortness of breath. (Patient not taking: Reported on 06/10/2019) 1 Inhaler 0  . promethazine (PHENERGAN) 25 MG tablet Take 1 tablet (25 mg total) by mouth every 8 (eight) hours as needed for nausea or vomiting. (Patient not taking: Reported on 06/10/2019) 20 tablet 0   No current facility-administered medications on file prior to visit.    No Known Allergies Social History   Socioeconomic History  . Marital status: Married    Spouse name: Not on file  . Number of children: Not on file  . Years of education: Not on  file  . Highest education level: Not on file  Occupational History  . Not on file  Social Needs  . Financial resource strain: Not on file  . Food insecurity    Worry: Not on file    Inability: Not on file  . Transportation needs    Medical: Not on file    Non-medical: Not on file  Tobacco Use  . Smoking status: Current Every Day Smoker    Packs/day: 0.50    Years: 40.00    Pack years: 20.00    Types: Cigarettes  . Smokeless tobacco: Never Used  Substance and Sexual Activity  . Alcohol use: Yes    Comment: social  . Drug use: No  . Sexual activity: Yes  Lifestyle  . Physical activity    Days per week: Not on file    Minutes per session: Not on file  . Stress: Not on file  Relationships  . Social Herbalist on phone: Not on file    Gets together: Not on file    Attends religious service: Not on file    Active member of club or organization: Not on file    Attends meetings of clubs or organizations: Not on file    Relationship status: Not on file  . Intimate partner violence    Fear of current or ex partner: Not on file    Emotionally abused: Not on file    Physically abused: Not on file    Forced sexual activity: Not on file  Other Topics Concern  . Not on file  Social History Narrative  . Not on file      Review of Systems  All other systems reviewed and are negative.      Objective:   Physical Exam  Constitutional: He is oriented to person, place, and time. He appears well-developed and well-nourished. No distress.  HENT:  Head: Normocephalic and atraumatic.  Right Ear: External ear normal.  Left Ear: External ear normal.  Nose: Nose normal.  Mouth/Throat: Oropharynx is clear and moist. No oropharyngeal exudate.  Eyes: Pupils are equal, round, and reactive to light. Conjunctivae and EOM are normal. Right eye exhibits no discharge. Left eye exhibits no discharge. No scleral icterus.  Neck: Normal range of motion. Neck supple. No JVD present.  No tracheal deviation present. No thyromegaly present.  Cardiovascular: Normal rate, regular rhythm, normal heart sounds and intact distal pulses. Exam reveals no gallop and no friction rub.  No murmur heard. Pulmonary/Chest: Effort normal and breath sounds normal. No stridor. No respiratory distress. He has no wheezes. He has no rales. He exhibits no tenderness.  Abdominal: Soft. Bowel sounds are normal. He exhibits no distension and no mass. There is no abdominal tenderness. There is no rebound and no guarding.  Musculoskeletal: Normal range of motion.        General: No tenderness, deformity or edema.  Lymphadenopathy:    He has no cervical adenopathy.  Neurological: He is alert and oriented to person, place, and time. He has normal reflexes. No cranial nerve deficit. He exhibits normal muscle tone. Coordination normal.  Skin: Skin is warm. No rash noted. He is not diaphoretic. No erythema. No pallor.  Psychiatric: He has a normal mood and affect. His behavior is normal. Judgment and thought content normal.  Vitals reviewed.         Assessment & Plan:  Type 2 diabetes mellitus with diabetic neuropathy, without long-term current use of insulin (HCC) - Plan: Hemoglobin A1c, CBC with Differential, COMPLETE METABOLIC PANEL WITH GFR, Lipid Panel, Microalbumin, urine  Chronic kidney disease, unspecified CKD stage  Benign essential HTN  Pure hypercholesterolemia  Blood pressure today is well controlled.  I am not certain of his glycemic control however if his hemoglobin A1c is greater than 6.5 I would recommend adding Trulicity to try to help with weight loss and better his glycemic control.  I will also check a fasting lipid panel and ideally I like his LDL cholesterol to be below 100.  I will monitor his chronic kidney disease by checking a BMP.  I also strongly encourage smoking cessation.  We discussed using nicotine gum as the patient could not tolerate Wellbutrin or Chantix in the past.   Patient received Pneumovax 23 today.  He declined HIV screening.  He declined hepatitis C screening.  He declined a colonoscopy but he does consent for Cologuard.  He also declined a flu shot despite my encouraging it.  Diabetic eye exam is up-to-date.

## 2019-06-10 NOTE — Addendum Note (Signed)
Addended by: Sheral Flow on: 06/10/2019 09:11 AM   Modules accepted: Orders

## 2019-06-11 ENCOUNTER — Telehealth: Payer: Self-pay | Admitting: *Deleted

## 2019-06-11 LAB — COMPLETE METABOLIC PANEL WITH GFR
AG Ratio: 1.6 (calc) (ref 1.0–2.5)
ALT: 10 U/L (ref 9–46)
AST: 12 U/L (ref 10–35)
Albumin: 3.9 g/dL (ref 3.6–5.1)
Alkaline phosphatase (APISO): 79 U/L (ref 35–144)
BUN: 12 mg/dL (ref 7–25)
CO2: 28 mmol/L (ref 20–32)
Calcium: 9.8 mg/dL (ref 8.6–10.3)
Chloride: 99 mmol/L (ref 98–110)
Creat: 1.24 mg/dL (ref 0.70–1.33)
GFR, Est African American: 74 mL/min/{1.73_m2} (ref >=60)
GFR, Est Non African American: 64 mL/min/{1.73_m2} (ref >=60)
Globulin: 2.4 g/dL (calc) (ref 1.9–3.7)
Glucose, Bld: 88 mg/dL (ref 65–99)
Potassium: 4.4 mmol/L (ref 3.5–5.3)
Sodium: 138 mmol/L (ref 135–146)
Total Bilirubin: 0.5 mg/dL (ref 0.2–1.2)
Total Protein: 6.3 g/dL (ref 6.1–8.1)

## 2019-06-11 LAB — HEMOGLOBIN A1C
Hgb A1c MFr Bld: 6 % of total Hgb — ABNORMAL HIGH (ref <5.7)
Mean Plasma Glucose: 126 (calc)
eAG (mmol/L): 7 (calc)

## 2019-06-11 LAB — CBC WITH DIFFERENTIAL/PLATELET
Absolute Monocytes: 1154 cells/uL — ABNORMAL HIGH (ref 200–950)
Basophils Absolute: 95 cells/uL (ref 0–200)
Basophils Relative: 0.8 %
Eosinophils Absolute: 1190 cells/uL — ABNORMAL HIGH (ref 15–500)
Eosinophils Relative: 10 %
HCT: 44.2 % (ref 38.5–50.0)
Hemoglobin: 14.7 g/dL (ref 13.2–17.1)
Lymphs Abs: 3106 cells/uL (ref 850–3900)
MCH: 30.6 pg (ref 27.0–33.0)
MCHC: 33.3 g/dL (ref 32.0–36.0)
MCV: 91.9 fL (ref 80.0–100.0)
MPV: 9.5 fL (ref 7.5–12.5)
Monocytes Relative: 9.7 %
Neutro Abs: 6355 cells/uL (ref 1500–7800)
Neutrophils Relative %: 53.4 %
Platelets: 329 10*3/uL (ref 140–400)
RBC: 4.81 10*6/uL (ref 4.20–5.80)
RDW: 13.1 % (ref 11.0–15.0)
Total Lymphocyte: 26.1 %
WBC: 11.9 10*3/uL — ABNORMAL HIGH (ref 3.8–10.8)

## 2019-06-11 LAB — MICROALBUMIN, URINE: Microalb, Ur: 1 mg/dL

## 2019-06-11 LAB — LIPID PANEL
Cholesterol: 121 mg/dL (ref <200)
HDL: 37 mg/dL — ABNORMAL LOW (ref >=40)
LDL Cholesterol (Calc): 64 mg/dL (calc)
Non-HDL Cholesterol (Calc): 84 mg/dL (calc) (ref <130)
Total CHOL/HDL Ratio: 3.3 (calc) (ref <5.0)
Triglycerides: 122 mg/dL (ref <150)

## 2019-06-11 NOTE — Telephone Encounter (Signed)
Received verbal orders for Cologuard.   Order placed via [employer].   Cologuard (Order [id number])

## 2019-06-11 NOTE — Telephone Encounter (Signed)
-----   Message from [place], LPN sent at [phone]  8:49 AM EST -----  ----- Message ----- From: Susy Frizzle, MD Sent: 06/10/2019   8:28 AM EST To: Alyson Locket, RMA  Please schedule cologuard

## 2019-08-09 ENCOUNTER — Other Ambulatory Visit: Payer: Self-pay

## 2019-08-09 ENCOUNTER — Ambulatory Visit (INDEPENDENT_AMBULATORY_CARE_PROVIDER_SITE_OTHER): Payer: 59 | Admitting: Family Medicine

## 2019-08-09 DIAGNOSIS — J069 Acute upper respiratory infection, unspecified: Secondary | ICD-10-CM

## 2019-08-09 NOTE — Progress Notes (Signed)
Subjective:    Patient ID: Jason French, male    DOB: Sep 14, 1960, 59 y.o.   MRN: [id number]  HPI Patient is being seen today as a telephone visit.  Phone call began at 213.  Phone call concluded at 224.  Symptoms began Friday.  He developed diffuse body aches all over suddenly without warning.  He was unable to hardly get out of bed.  He had a fever to 101.  He also then developed a sore throat, sinus pain, mild congestion, nonproductive cough, and a dull headache.  Symptoms have persisted through the weekend.  He has not had another fever today however he continues to have a cough and head congestion and sore throat.  He denies any chest pain.  He denies any shortness of breath.  He denies any hemoptysis.  Patient denies any nausea or vomiting.  He denies any diarrhea.  He denies any loss of sense of smell or taste. Past Medical History:  Diagnosis Date  . Arthritis   . Cancer [facility])    adrenal cancer  . Diabetes mellitus without complication ([place])   . Diverticulitis   . Hyperlipidemia   . Hypertension   . Neuropathy   . RLS (restless legs syndrome)   . Sleep apnea    use CPAP   Past Surgical History:  Procedure Laterality Date  . COLON SURGERY     ruptured diverticulosis  . KNEE ARTHROSCOPY WITH MEDIAL MENISECTOMY Right 06/12/2017   Procedure: KNEE ARTHROSCOPY WITH PARTIAL MEDIAL MENISECTOMY;  Surgeon: [name], MD;  Location: [facility];  Service: Orthopedics;  Laterality: Right;  . RESECTION OF ABDOMINAL MASS     adrenal mass right (cancer)  . TOTAL KNEE ARTHROPLASTY Right 07/13/2018   Procedure: RIGHT TOTAL KNEE ARTHROPLASTY,INJECTION OF LEFT KNEE;  Surgeon: Gaynelle Arabian, MD;  Location: [facility];  Service: Orthopedics;  Laterality: Right;  54min   Current Outpatient Medications on File Prior to Visit  Medication Sig Dispense Refill  . albuterol (PROVENTIL HFA;VENTOLIN HFA) 108 (90 Base) MCG/ACT inhaler Inhale 2 puffs into the lungs every 6 (six) hours as needed for  wheezing or shortness of breath. (Patient not taking: Reported on 06/10/2019) 1 Inhaler 0  . atorvastatin (LIPITOR) 10 MG tablet TAKE (1) TABLET BY MOUTH AT BEDTIME. 90 tablet 3  . benazepril (LOTENSIN) 40 MG tablet Take 1 tablet (40 mg total) by mouth daily. 90 tablet 3  . DULoxetine (CYMBALTA) 60 MG capsule TAKE (1) CAPSULE BY MOUTH ONCE DAILY. 90 capsule 3  . gabapentin (NEURONTIN) 300 MG capsule TAKE 2 CAPSULES BY MOUTH THREE TIMES A DAY. 540 capsule 3  . hydrochlorothiazide (HYDRODIURIL) 25 MG tablet TAKE (1) TABLET BY MOUTH ONCE DAILY. 90 tablet 3  . metFORMIN (GLUCOPHAGE) 1000 MG tablet Take 1 tablet (1,000 mg total) by mouth 2 (two) times daily with a meal. 180 tablet 3  . methocarbamol (ROBAXIN) 500 MG tablet Take 1 tablet (500 mg total) by mouth every 6 (six) hours as needed for muscle spasms. 40 tablet 0  . promethazine (PHENERGAN) 25 MG tablet Take 1 tablet (25 mg total) by mouth every 8 (eight) hours as needed for nausea or vomiting. (Patient not taking: Reported on 06/10/2019) 20 tablet 0  . rOPINIRole (REQUIP) 1 MG tablet TAKE (2) TABLETS BY MOUTH AT BEDTIME. 180 tablet 3   No current facility-administered medications on file prior to visit.   [place] you left a message.all Social History   Socioeconomic History  . Marital status: Married  Spouse name: Not on file  . Number of children: Not on file  . Years of education: Not on file  . Highest education level: Not on file  Occupational History  . Not on file  Tobacco Use  . Smoking status: Current Every Day Smoker    Packs/day: 0.50    Years: 40.00    Pack years: 20.00    Types: Cigarettes  . Smokeless tobacco: Never Used  Substance and Sexual Activity  . Alcohol use: Yes    Comment: social  . Drug use: No  . Sexual activity: Yes  Other Topics Concern  . Not on file  Social History Narrative  . Not on file   Social Determinants of Health   Financial Resource Strain:   . Difficulty of Paying Living  Expenses: Not on file  Food Insecurity:   . Worried About Charity fundraiser in the Last Year: Not on file  . Ran Out of Food in the Last Year: Not on file  Transportation Needs:   . Lack of Transportation (Medical): Not on file  . Lack of Transportation (Non-Medical): Not on file  Physical Activity:   . Days of Exercise per Week: Not on file  . Minutes of Exercise per Session: Not on file  Stress:   . Feeling of Stress : Not on file  Social Connections:   . Frequency of Communication with Friends and Family: Not on file  . Frequency of Social Gatherings with Friends and Family: Not on file  . Attends Religious Services: Not on file  . Active Member of Clubs or Organizations: Not on file  . Attends Archivist Meetings: Not on file  . Marital Status: Not on file  Intimate Partner Violence:   . Fear of Current or Ex-Partner: Not on file  . Emotionally Abused: Not on file  . Physically Abused: Not on file  . Sexually Abused: Not on file      Review of Systems  All other systems reviewed and are negative. Is     Objective:   Physical Exam        Assessment & Plan:  URI, acute  Patient symptoms are consistent with a viral upper respiratory infection.  I am very concerned about COVID-19.  Patient will be a high risk candidate given his diabetes, and underlying health problems.  Patient is also a smoker.  He denies any chest pain or wheezing or shortness of breath at the present time.  There are no emergency indications to warrant hospitalization.  I have given the patient contact information to arrange Covid testing through the hospital.  I recommended that he quarantine at home for 14 days total.  I have recommended that he try to quarantine away from his family members to avoid potential spread to them.  If the patient develops any chest pain or shortness of breath he is to go immediately to the hospital.  Otherwise, the patient should be treated symptomatically with  Tylenol and/or ibuprofen for body aches and fever and Robitussin-DM for cough.

## 2019-08-10 ENCOUNTER — Ambulatory Visit: Payer: 59 | Attending: Internal Medicine

## 2019-08-10 DIAGNOSIS — Z20822 Contact with and (suspected) exposure to covid-19: Secondary | ICD-10-CM

## 2019-08-11 LAB — NOVEL CORONAVIRUS, NAA: SARS-CoV-2, NAA: NOT DETECTED

## 2019-11-09 ENCOUNTER — Telehealth: Payer: Self-pay | Admitting: Family Medicine

## 2019-11-09 DIAGNOSIS — Z1211 Encounter for screening for malignant neoplasm of colon: Secondary | ICD-10-CM

## 2019-11-09 NOTE — Telephone Encounter (Signed)
Pt want GI referral in [place] preferred Mondays.

## 2019-11-10 NOTE — Telephone Encounter (Signed)
Referral placed.

## 2019-11-12 ENCOUNTER — Encounter: Payer: Self-pay | Admitting: Internal Medicine

## 2020-01-10 IMAGING — CR DG CHEST 2V
2 series · 2 of 2 positions shown · non-contrast
Comparison: None.

CLINICAL DATA: Abnormal lung sounds with right basilar crackles.
Nonproductive cough and congestion for 2-3 weeks.

EXAM:
CHEST - 2 VIEW

[w chest pa]
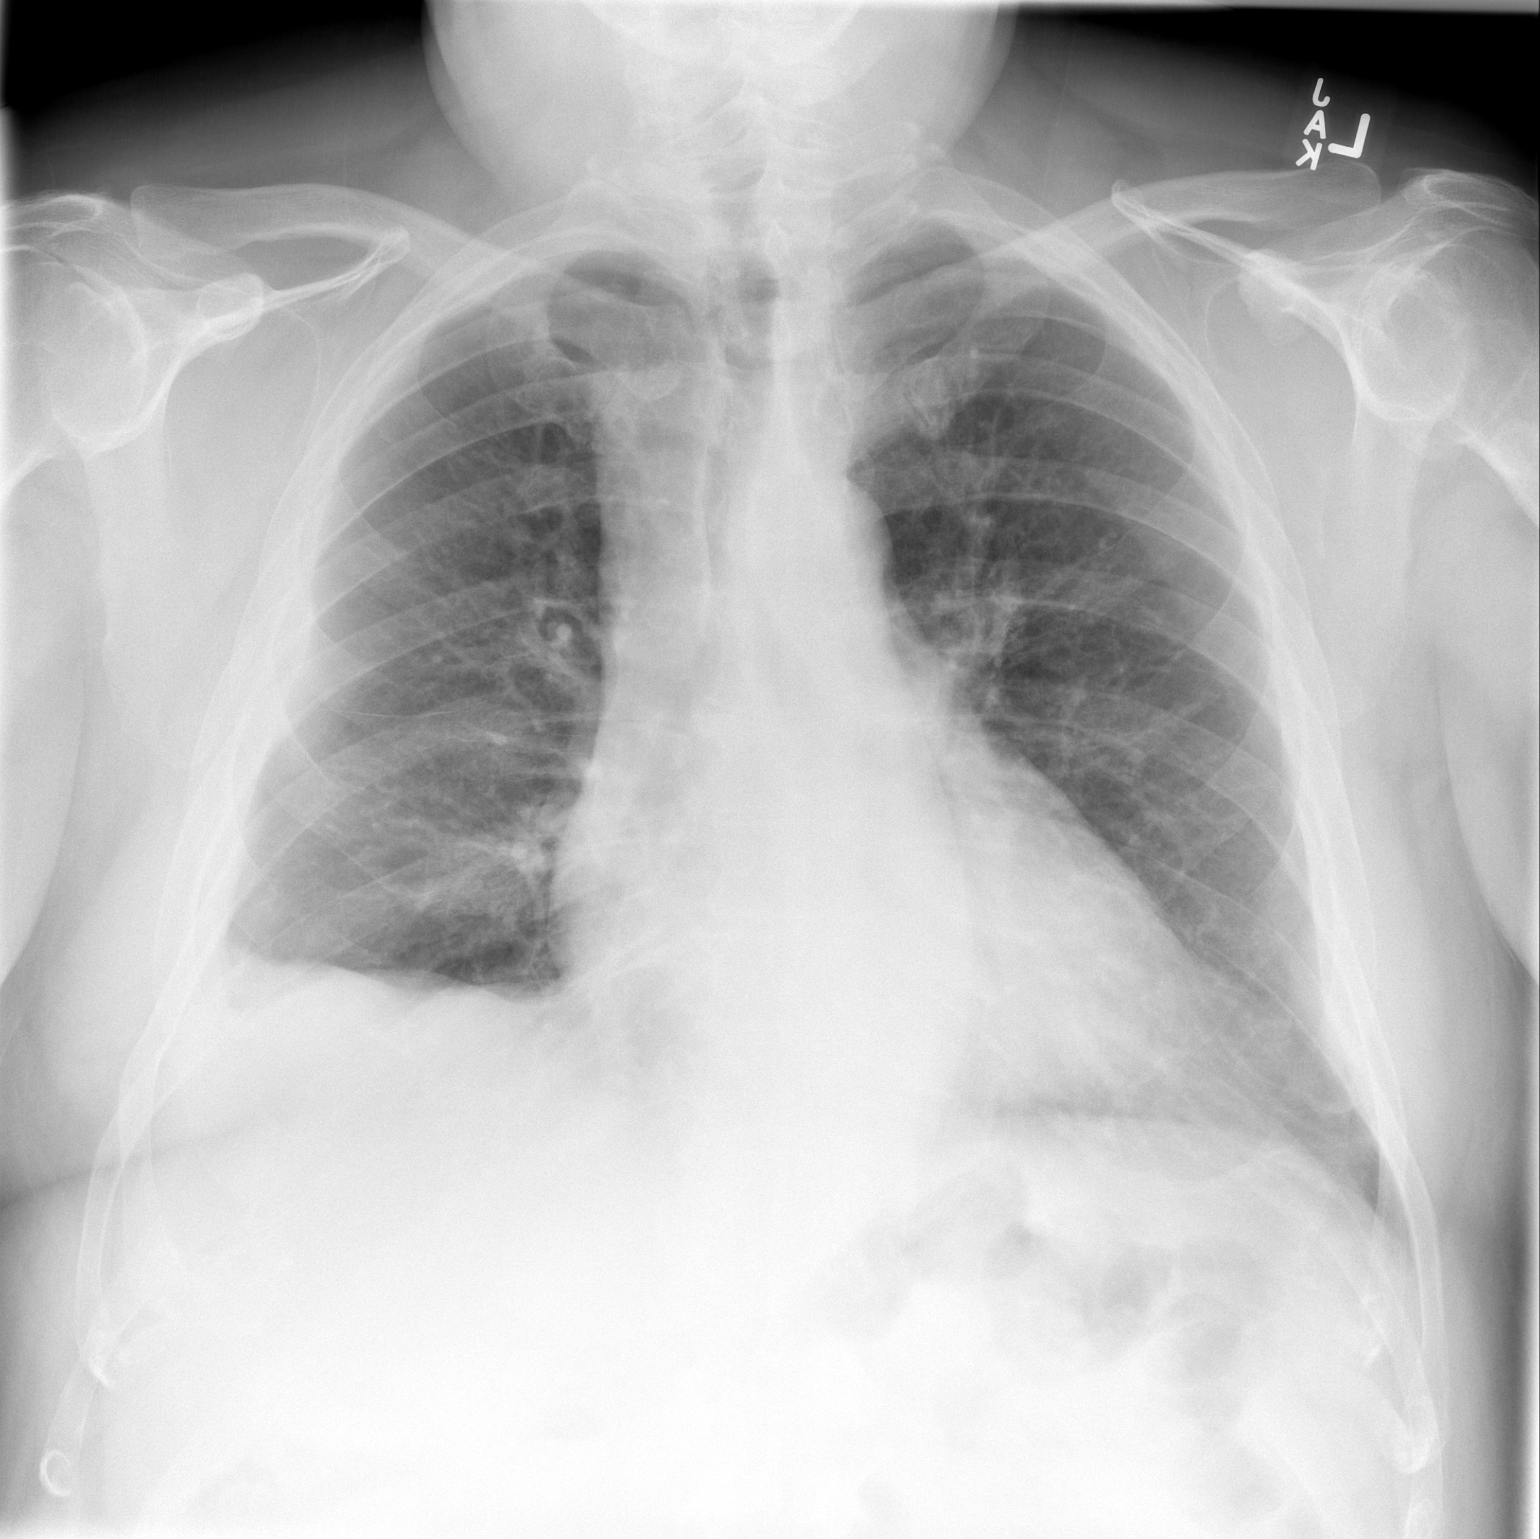

[w chest lat]
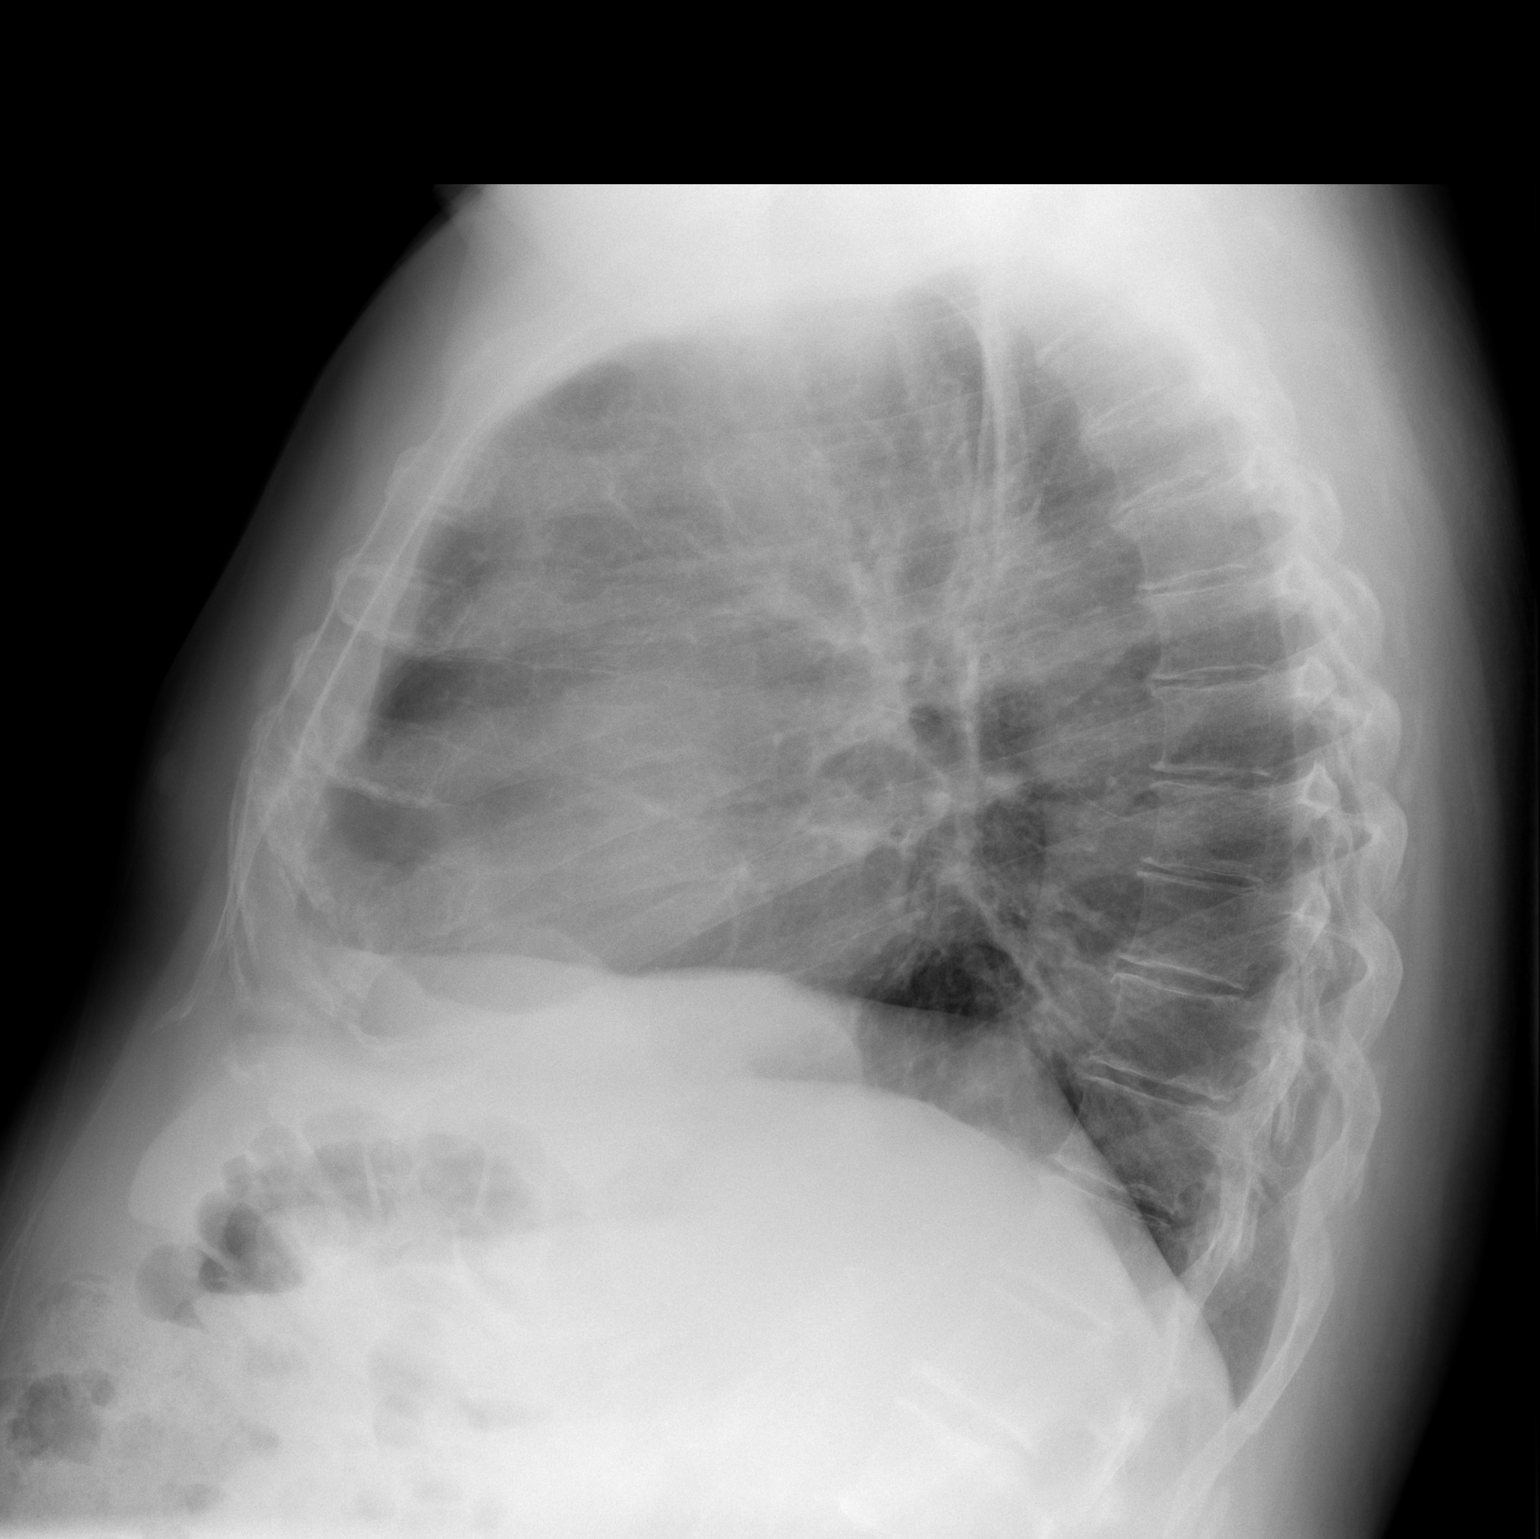

[2 of 2 positions shown; findings below may reference images not displayed]

FINDINGS: The cardiac silhouette is borderline to mildly enlarged. There is
mild elevation of the right hemidiaphragm with blunting of the right
costophrenic angle laterally but without evidence of significant
pleural fluid posteriorly. Minimal scarring or atelectasis is
present in the lung bases. No pneumothorax is identified. No acute
osseous abnormality is seen.
IMPRESSION: No active cardiopulmonary disease.

## 2020-01-19 NOTE — Progress Notes (Signed)
Referring Provider: Susy Frizzle, MD Primary Care Physician:  Susy Frizzle, MD Primary Gastroenterologist:  Dr. Gala Romney  Chief Complaint  Patient presents with  . Consult    TCS last done 2011    HPI:   Jason French is a 59 y.o. male presenting today at the request of [name], [name], MD for consult colonoscopy.  Past medical history of diabetes, sleep apnea, restless leg syndrome, neuropathy, HTN, HLD, adrenal cancer, arthritis, and diverticulitis s/p sigmoid resection in 2006.   Last colonoscopy in 03/19/2010: Friable anal canal, otherwise normal rectum.  Diminutive polyps about the sigmoid and anastomosis at 30 cm are ablated and/or snare removed, multiple polyps from the ileocecal valve biopsied, ablated or snared.  Residual scattered pancolonic diverticula.  Suspected rectal bleeding from anorectum in the setting of irritable bowel syndrome.  Ileocecal polyps were tubular adenomas, otherwise hyperplastic polyp and benign colonic mucosa on path. Recommended 7 year repeat.   Today: Occasional lower abdominal pain. Rare. No triggers. May be if he has gone a few days without a BM. Last most of the day if it occurs. BMs every 2-3 days. Will have 3-4 BMs in a 24 hour period when he does start to move his bowels. Range from [place] 3-5 typically. Has never taken anything to help with bowel regularity. Intermittent blood in the stool. 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 No blood in about 1-2 months. Blood in toilet water, mixed in stool, and on toilet tissue. 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 Can't tell be if it bright red. Will have associated rectal pain. Occurs when he is passing a hard stool. No prolapsed hemorrhoids. Usees preparation H which resolves symptoms.   Got sick around the first of the year. Had black stool/diarrhea x 3 days. Was taking a lot of pepto bismol. No black stools since. No iron.   No GERD symptoms, N/V. Occasional dysphagia about once a week with pills or  solid foods. They will go down on their own. Increasing in frequency.  No food regurgitation. No prior EGD. Present for years. Interested in EGD. Prefers stronger sedation than he has with TCS as he has woken up during TCS.  No upper abdominal pain.   Taking tylenol for arthritis. NSAIDs rarely.     **Mention of abnormal bleeding time in problem list.  This was listed in 2018.  Per chart review, PT in October 2018, 13.3 (H), INR 1.18.  Most recent INR December 2019 1.23. States he has been seen by hematology in the past. Didn't feel like anything need to be done. Just monitor and let someone know when he is going for surgery. No issues with prior surgeries. Last surgery was knee replacement in 2019.    Past Medical History:  Diagnosis Date  . Arthritis   . Cancer [facility])    adrenal cancer  . Diabetes mellitus without complication ([place])   . Diverticulitis 2006   s/p sigmoid colectomy, continues with scattered pancolinic diverticula  . Hyperlipidemia   . Hypertension   . Neuropathy   . RLS (restless legs syndrome)   . Sleep apnea    use CPAP    Past Surgical History:  Procedure Laterality Date  . COLON SURGERY  2006   ruptured diverticulosis; sigmoid resection.   . COLONOSCOPY  10/26/2004   Dr. Gala Romney; internal hemorrhoids, few scattered pancolonic diverticula, 3 left colon polyps.  Cannot locate path in chart.  . COLONOSCOPY  03/19/2010   Friable anal canal,  diminutive polyps about the sigmoid and anastomosis at 30 cm ablated and/or snare removed, multiple polyps from ileocecal valve biopsied/ ablated/or snared.  Residual scattered pancolonic diverticula.  Suspected rectal bleeding from anorectum in the setting of IBS.  Ileocecal pathology with tubular adenomas, otherwise hyperplastic polyps/benign small bowel mucosa. 7 yr repeat  . KNEE ARTHROSCOPY WITH MEDIAL MENISECTOMY Right 06/12/2017   Procedure: KNEE ARTHROSCOPY WITH PARTIAL MEDIAL MENISECTOMY;  Surgeon: [name], MD;   Location: [facility];  Service: Orthopedics;  Laterality: Right;  . RESECTION OF ABDOMINAL MASS     adrenal mass right (cancer)  . TOTAL KNEE ARTHROPLASTY Right 07/13/2018   Procedure: RIGHT TOTAL KNEE ARTHROPLASTY,INJECTION OF LEFT KNEE;  Surgeon: Gaynelle Arabian, MD;  Location: [facility];  Service: Orthopedics;  Laterality: Right;  62min    Current Outpatient Medications  Medication Sig Dispense Refill  . albuterol (PROVENTIL HFA;VENTOLIN HFA) 108 (90 Base) MCG/ACT inhaler Inhale 2 puffs into the lungs every 6 (six) hours as needed for wheezing or shortness of breath. 1 Inhaler 0  . atorvastatin (LIPITOR) 10 MG tablet TAKE (1) TABLET BY MOUTH AT BEDTIME. 90 tablet 3  . benazepril (LOTENSIN) 40 MG tablet Take 1 tablet (40 mg total) by mouth daily. 90 tablet 3  . gabapentin (NEURONTIN) 300 MG capsule TAKE 2 CAPSULES BY MOUTH THREE TIMES A DAY. 540 capsule 3  . hydrochlorothiazide (HYDRODIURIL) 25 MG tablet TAKE (1) TABLET BY MOUTH ONCE DAILY. 90 tablet 3  . metFORMIN (GLUCOPHAGE) 1000 MG tablet Take 1 tablet (1,000 mg total) by mouth 2 (two) times daily with a meal. 180 tablet 3  . rOPINIRole (REQUIP) 1 MG tablet TAKE (2) TABLETS BY MOUTH AT BEDTIME. 180 tablet 3   No current facility-administered medications for this visit.    Allergies as of 01/20/2020  . (No Known Allergies)    Family History  Problem Relation Age of Onset  . Diverticulitis Mother   . Hypertension Mother   . Diverticulitis Father   . Hypertension Father   . Colon cancer Father        late 34s    Social History   Socioeconomic History  . Marital status: Married    Spouse name: Not on file  . Number of children: Not on file  . Years of education: Not on file  . Highest education level: Not on file  Occupational History  . Not on file  Tobacco Use  . Smoking status: Current Every Day Smoker    Packs/day: 0.50    Years: 40.00    Pack years: 20.00    Types: Cigarettes  . Smokeless tobacco: Never Used  Vaping  Use  . Vaping Use: Never used  Substance and Sexual Activity  . Alcohol use: Yes    Comment: once a month  . Drug use: No  . Sexual activity: Yes  Other Topics Concern  . Not on file  Social History Narrative  . Not on file   Social Determinants of Health   Financial Resource Strain:   . Difficulty of Paying Living Expenses:   Food Insecurity:   . Worried About Charity fundraiser in the Last Year:   . Arboriculturist in the Last Year:   Transportation Needs:   . Film/video editor (Medical):   Marland Kitchen Lack of Transportation (Non-Medical):   Physical Activity:   . Days of Exercise per Week:   . Minutes of Exercise per Session:   Stress:   . Feeling of Stress :  Social Connections:   . Frequency of Communication with Friends and Family:   . Frequency of Social Gatherings with Friends and Family:   . Attends Religious Services:   . Active Member of Clubs or Organizations:   . Attends Archivist Meetings:   Marland Kitchen Marital Status:   Intimate Partner Violence:   . Fear of Current or Ex-Partner:   . Emotionally Abused:   Marland Kitchen Physically Abused:   . Sexually Abused:     Review of Systems: Gen: Denies any fever, chills, lightheadedness, dizziness, presyncope, syncope CV: Denies chest pain or heart palpitations Resp: Denies shortness of breath. Occasional cough.  GI: See HPI GU : Denies urinary burning, urinary frequency, urinary hesitancy MS: Admits to joint pain in fingers and knees.  Derm: Denies rash Psych: Denies depression or anxiety Heme: See HPI  Physical Exam: BP 134/90   Pulse 95   Temp (!) 97.1 F (36.2 C)   Ht 5\' 9"  (1.753 m)   Wt 256 lb 9.6 oz (116.4 kg)   BMI 37.89 kg/m  General:   Alert and oriented. Pleasant and cooperative. Well-nourished and well-developed.  Head:  Normocephalic and atraumatic. Eyes:  Without icterus, sclera clear and conjunctiva pink.  Ears:  Normal auditory acuity. Lungs:  Clear to auscultation bilaterally. No wheezes,  rales, or rhonchi. No distress.  Heart:  S1, S2 present without murmurs appreciated.  Abdomen:  +BS, soft, non-tender and non-distended. No HSM noted. No guarding or rebound. No masses appreciated.  Rectal:  Deferred  Msk:  Symmetrical without gross deformities. Normal posture. Extremities:  Without edema. Neurologic:  Alert and  oriented x4;  grossly normal neurologically. Skin:  Intact without significant lesions or rashes. Psych:  Normal mood and affect.

## 2020-01-20 ENCOUNTER — Encounter: Payer: Self-pay | Admitting: Gastroenterology

## 2020-01-20 ENCOUNTER — Other Ambulatory Visit: Payer: Self-pay

## 2020-01-20 ENCOUNTER — Ambulatory Visit (INDEPENDENT_AMBULATORY_CARE_PROVIDER_SITE_OTHER): Payer: 59 | Admitting: Gastroenterology

## 2020-01-20 VITALS — BP 134/90 | HR 95 | Temp 97.1°F | Ht 69.0 in | Wt 256.6 lb

## 2020-01-20 DIAGNOSIS — Z860101 Personal history of adenomatous and serrated colon polyps: Secondary | ICD-10-CM

## 2020-01-20 DIAGNOSIS — Z86010 Personal history of colonic polyps: Secondary | ICD-10-CM

## 2020-01-20 DIAGNOSIS — K625 Hemorrhage of anus and rectum: Secondary | ICD-10-CM

## 2020-01-20 DIAGNOSIS — R1310 Dysphagia, unspecified: Secondary | ICD-10-CM

## 2020-01-20 DIAGNOSIS — K5900 Constipation, unspecified: Secondary | ICD-10-CM

## 2020-01-20 NOTE — Patient Instructions (Signed)
We will be scheduled for colonoscopy and upper endoscopy with possible dilation of your esophagus in the near future with Dr. Gala Romney. 1 day prior to your procedure: Take one half dose of metformin (500 mg twice daily) Date of procedure: Do not take any diabetes medications the morning of your procedure.  Be sure to eat slowly, take small bites, chew thoroughly.  Only take 1 pill at a time with plenty of fluids. If something were to get hung in your esophagus and not come up or go down, you should proceed to the emergency room.  For mild constipation: Add Benefiber or Metamucil daily.  These are fiber supplements. Add MiraLAX one capful (17 g) daily in 8 ounces of water.  If you develop frequent loose stools, you can decrease the frequency of MiraLAX. It is important to avoid constipation as you do have hemorrhoids.  For hemorrhoids: Continue to use Preparation H as needed. Limit toilet time to 2-3 minutes. Avoid straining.  We will see you back after your procedures.  Not hesitate to call with questions or concerns prior.  Aliene Altes, PA-C [facility]

## 2020-01-20 NOTE — Assessment & Plan Note (Addendum)
Currently overdue for surveillance colonoscopy.  Last colonoscopy in 2011 with friable anal canal, diminutive polyp about the sigmoid and anastomosis at 30 cm, multiple polyps at ileocecal valve, residual scattered pancolonic diverticula.  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  Recommended 7-year repeat.  Lower GI symptoms include chronic intermittent rectal bleeding in the setting of intermittent constipation/hard stools.  Symptoms resolved with Preparation H.  Known history of hemorrhoids on prior colonoscopies.  No unintentional weight loss.  Reports father had colon cancer in his 60s.  Proceed with colonoscopy with propofol with Dr. Gala Romney in the near future. The risks, benefits, and alternatives have been discussed in detail with patient. They have stated understanding and desire to proceed.  See separate instructions for diabetes medication adjustments prior to procedure.  *Please note, patient has documented abnormal bleeding time.  This is chronic.  Per patient, has been evaluated by hematology in the past.  Advised to monitor.  He has never had any issues with surgery.  Previously colonoscopies without issue.  Knee replacement in 2019 without issue.  He will have INR/PT completed with preop labs.  Follow-up after procedure.

## 2020-01-20 NOTE — Assessment & Plan Note (Addendum)
Mild intermittent constipation.  This is chronic.  Associated hard stools with rectal bleeding at times.  Known internal hemorrhoids on prior colonoscopies.  He is currently overdue for surveillance colonoscopy due to history of adenomatous colon polyps last colonoscopy in 2011 as discussed above.    Start Benefiber or Metamucil daily.  Add MiraLAX 1 capful (17 g) daily in 8 ounces of water.  Advised to decrease frequency if he develops frequent loose stools.  Proceed with colonoscopy for surveillance as discussed above.  Follow-up after procedure.

## 2020-01-20 NOTE — Assessment & Plan Note (Addendum)
Chronic intermittent rectal bleeding in the setting of intermittent constipation.  Rectal bleeding occurs when he passes hard stools.  Also associated rectal pain.  Will use Preparation H and symptoms resolved.  No unintentional weight loss.  Known history of internal hemorrhoids.  He is currently due for surveillance colonoscopy with last colonoscopy in 2011 with tubular adenomas.  Also reports father had history of colon cancer diagnosed in his 63s.  Suspect bleeding is likely from benign anorectal source including hemorrhoids.  Cannot rule out colon polyps or malignancy.  Continue using Preparation H as needed for hemorrhoids. Add Benefiber or Metamucil daily. Add MiraLAX 1 capful (17 g) daily in 8 ounces of water.  Decrease frequency if he develops frequent loose stools. Limit toilet time 2-3 minutes. Avoid straining. Proceed with colonoscopy with propofol with Dr. Gala Romney in the near future. The risks, benefits, and alternatives have been discussed in detail with patient. They have stated understanding and desire to proceed.  See separate instructions for diabetes medication adjustments prior to procedure.  Please note, patient has documented abnormal bleeding time.  This is chronic.  Per patient, has been evaluated by hematology in the past.  Advised to monitor.  He has never had any issues with surgery.  Previously colonoscopies without issue.  Knee replacement in 2019 without issue.  He will have INR/PT completed with preop labs.  Follow-up after procedure.

## 2020-01-20 NOTE — Assessment & Plan Note (Addendum)
Chronic intermittent dysphagia to pills and solid foods for years.  Increasing in frequency.  Currently occurring about once a week.  No food regurgitation.  Denies GERD symptoms.  Differential: Possible esophageal web, ring, or stricture.  Less likely malignancy.  Proceed with EGD +/-dilation with propofol with Dr. Gala Romney in the near future.  This will be performed at the same time of surveillance colonoscopy. Advised to be eat slowly, take small bites, chew thoroughly. Recommended proceeding to the emergency room if something were to get hung in his esophagus and not come up or go down.  Please note, patient has documented abnormal bleeding time.  This is chronic.  Per patient, has been evaluated by hematology in the past.  Advised to monitor.  He has never had any issues with surgery.  Previously colonoscopies without issue.  Knee replacement in 2019 without issue.  He will have INR/PT completed with preop labs.  Follow-up after procedure.

## 2020-02-02 ENCOUNTER — Other Ambulatory Visit: Payer: Self-pay

## 2020-02-02 MED ORDER — ROPINIROLE HCL 1 MG PO TABS: ORAL_TABLET | ORAL | 3 refills | Status: DC

## 2020-02-03 ENCOUNTER — Encounter: Payer: Self-pay | Admitting: *Deleted

## 2020-02-03 ENCOUNTER — Telehealth: Payer: Self-pay | Admitting: *Deleted

## 2020-02-03 MED ORDER — CLENPIQ 10-3.5-12 MG-GM -GM/160ML PO SOLN: 1.0000 | Freq: Once | ORAL | 0 refills | Status: AC

## 2020-02-03 NOTE — Telephone Encounter (Signed)
Patient returned call. He is scheduled for 8/16 at 7:30am for procedure. Aware he will need pre-op/covid test prior. Advised will mail this with his instructions. Confirmed pharmacy. Confirmed mailing address is correct. Orders entered.

## 2020-02-03 NOTE — Telephone Encounter (Signed)
LMOVM for pt to call back to schedule TCS/EGD +/-DIL WITH PROPOFOL WITH RMR

## 2020-03-08 NOTE — Patient Instructions (Signed)
HERNANDEZ LOSASSO  03/08/2020     @PREFPERIOPPHARMACY @   Your procedure is scheduled on  03/13/2020.  Report to [name] at  [phone]  A.M.  Call this number if you have problems the morning of surgery:  [phone]   Remember:  Follow the diet and prep instructions given to you by Dr Roseanne Kaufman office.                      Take these medicines the morning of surgery with A SIP OF WATER  Gabapentin. Use your inhaler before you come. DO NOT take any medications for diabetes the morning of your procedure.    Do not wear jewelry, make-up or nail polish.  Do not wear lotions, powders, or perfumes. Please wear deodorant and brush your teeth.  Do not shave 48 hours prior to surgery.  Men may shave face and neck.  Do not bring valuables to the hospital.  [facility] is not responsible for any belongings or valuables.  Contacts, dentures or bridgework may not be worn into surgery.  Leave your suitcase in the car.  After surgery it may be brought to your room.  For patients admitted to the hospital, discharge time will be determined by your treatment team.  Patients discharged the day of surgery will not be allowed to drive home.   Name and phone number of your driver:   family Special instructions:  DO NOT smoke the morning of your procedure.  Please read over the following fact sheets that you were given. Anesthesia Post-op Instructions and Care and Recovery After Surgery       Upper Endoscopy, Adult, Care After This sheet gives you information about how to care for yourself after your procedure. Your health care provider may also give you more specific instructions. If you have problems or questions, contact your health care provider. What can I expect after the procedure? After the procedure, it is common to have:  A sore throat.  Mild stomach pain or discomfort.  Bloating.  Nausea. Follow these instructions at home:   Follow instructions from your health care  provider about what to eat or drink after your procedure.  Return to your normal activities as told by your health care provider. Ask your health care provider what activities are safe for you.  Take over-the-counter and prescription medicines only as told by your health care provider.  Do not drive for 24 hours if you were given a sedative during your procedure.  Keep all follow-up visits as told by your health care provider. This is important. Contact a health care provider if you have:  A sore throat that lasts longer than one day.  Trouble swallowing. Get help right away if:  You vomit blood or your vomit looks like coffee grounds.  You have: ? A fever. ? Bloody, black, or tarry stools. ? A severe sore throat or you cannot swallow. ? Difficulty breathing. ? Severe pain in your chest or abdomen. Summary  After the procedure, it is common to have a sore throat, mild stomach discomfort, bloating, and nausea.  Do not drive for 24 hours if you were given a sedative during the procedure.  Follow instructions from your health care provider about what to eat or drink after your procedure.  Return to your normal activities as told by your health care provider. This information is not intended to replace advice given to you by your  health care provider. Make sure you discuss any questions you have with your health care provider. Document Revised: 01/06/2018 Document Reviewed: 12/15/2017 Elsevier Patient Education  [place].  Colonoscopy, Adult, Care After This sheet gives you information about how to care for yourself after your procedure. Your health care provider may also give you more specific instructions. If you have problems or questions, contact your health care provider. What can I expect after the procedure? After the procedure, it is common to have:  A small amount of blood in your stool for 24 hours after the procedure.  Some gas.  Mild cramping or  bloating of your abdomen. Follow these instructions at home: Eating and drinking   Drink enough fluid to keep your urine pale yellow.  Follow instructions from your health care provider about eating or drinking restrictions.  Resume your normal diet as instructed by your health care provider. Avoid heavy or fried foods that are hard to digest. Activity  Rest as told by your health care provider.  Avoid sitting for a long time without moving. Get up to take short walks every 1-2 hours. This is important to improve blood flow and breathing. Ask for help if you feel weak or unsteady.  Return to your normal activities as told by your health care provider. Ask your health care provider what activities are safe for you. Managing cramping and bloating   Try walking around when you have cramps or feel bloated.  Apply heat to your abdomen as told by your health care provider. Use the heat source that your health care provider recommends, such as a moist heat pack or a heating pad. ? Place a towel between your skin and the heat source. ? Leave the heat on for 20-30 minutes. ? Remove the heat if your skin turns bright red. This is especially important if you are unable to feel pain, heat, or cold. You may have a greater risk of getting burned. General instructions  For the first 24 hours after the procedure: ? Do not drive or use machinery. ? Do not sign important documents. ? Do not drink alcohol. ? Do your regular daily activities at a slower pace than normal. ? Eat soft foods that are easy to digest.  Take over-the-counter and prescription medicines only as told by your health care provider.  Keep all follow-up visits as told by your health care provider. This is important. Contact a health care provider if:  You have blood in your stool 2-3 days after the procedure. Get help right away if you have:  More than a small spotting of blood in your stool.  Large blood clots in your  stool.  Swelling of your abdomen.  Nausea or vomiting.  A fever.  Increasing pain in your abdomen that is not relieved with medicine. Summary  After the procedure, it is common to have a small amount of blood in your stool. You may also have mild cramping and bloating of your abdomen.  For the first 24 hours after the procedure, do not drive or use machinery, sign important documents, or drink alcohol.  Get help right away if you have a lot of blood in your stool, nausea or vomiting, a fever, or increased pain in your abdomen. This information is not intended to replace advice given to you by your health care provider. Make sure you discuss any questions you have with your health care provider. Document Revised: 02/08/2019 Document Reviewed: 02/08/2019 Elsevier Patient Education  [phone]  [place] After These instructions provide you with information about caring for yourself after your procedure. Your health care provider may also give you more specific instructions. Your treatment has been planned according to current medical practices, but problems sometimes occur. Call your health care provider if you have any problems or questions after your procedure. What can I expect after the procedure? After your procedure, you may:  Feel sleepy for several hours.  Feel clumsy and have poor balance for several hours.  Feel forgetful about what happened after the procedure.  Have poor judgment for several hours.  Feel nauseous or vomit.  Have a sore throat if you had a breathing tube during the procedure. Follow these instructions at home: For at least 24 hours after the procedure:      Have a responsible adult stay with you. It is important to have someone help care for you until you are awake and alert.  Rest as needed.  Do not: ? Participate in activities in which you could fall or become injured. ? Drive. ? Use heavy machinery. ? Drink  alcohol. ? Take sleeping pills or medicines that cause drowsiness. ? Make important decisions or sign legal documents. ? Take care of children on your own. Eating and drinking  Follow the diet that is recommended by your health care provider.  If you vomit, drink water, juice, or soup when you can drink without vomiting.  Make sure you have little or no nausea before eating solid foods. General instructions  Take over-the-counter and prescription medicines only as told by your health care provider.  If you have sleep apnea, surgery and certain medicines can increase your risk for breathing problems. Follow instructions from your health care provider about wearing your sleep device: ? Anytime you are sleeping, including during daytime naps. ? While taking prescription pain medicines, sleeping medicines, or medicines that make you drowsy.  If you smoke, do not smoke without supervision.  Keep all follow-up visits as told by your health care provider. This is important. Contact a health care provider if:  You keep feeling nauseous or you keep vomiting.  You feel light-headed.  You develop a rash.  You have a fever. Get help right away if:  You have trouble breathing. Summary  For several hours after your procedure, you may feel sleepy and have poor judgment.  Have a responsible adult stay with you for at least 24 hours or until you are awake and alert. This information is not intended to replace advice given to you by your health care provider. Make sure you discuss any questions you have with your health care provider. Document Revised: 10/13/2017 Document Reviewed: 11/05/2015 Elsevier Patient Education  [place].

## 2020-03-10 ENCOUNTER — Other Ambulatory Visit (HOSPITAL_COMMUNITY)
Admission: RE | Admit: 2020-03-10 | Discharge: 2020-03-10 | Disposition: A | Discharge status: Home / Self Care | Payer: Managed Care, Other (non HMO) | Source: Ambulatory Visit | Attending: Internal Medicine | Admitting: Internal Medicine

## 2020-03-10 ENCOUNTER — Encounter (HOSPITAL_COMMUNITY)
Admission: RE | Admit: 2020-03-10 | Discharge: 2020-03-10 | Disposition: A | Discharge status: Home / Self Care | Payer: Managed Care, Other (non HMO) | Source: Ambulatory Visit | Attending: Internal Medicine | Admitting: Internal Medicine

## 2020-03-10 ENCOUNTER — Other Ambulatory Visit: Payer: Self-pay

## 2020-03-10 ENCOUNTER — Encounter (HOSPITAL_COMMUNITY): Payer: Self-pay

## 2020-03-10 DIAGNOSIS — Z01818 Encounter for other preprocedural examination: Secondary | ICD-10-CM | POA: Insufficient documentation

## 2020-03-10 DIAGNOSIS — Z20822 Contact with and (suspected) exposure to covid-19: Secondary | ICD-10-CM | POA: Diagnosis not present

## 2020-03-10 DIAGNOSIS — I10 Essential (primary) hypertension: Secondary | ICD-10-CM | POA: Diagnosis not present

## 2020-03-10 LAB — COMPREHENSIVE METABOLIC PANEL
ALT: 13 U/L (ref 0–44)
AST: 16 U/L (ref 15–41)
Albumin: 3.8 g/dL (ref 3.5–5.0)
Alkaline Phosphatase: 82 U/L (ref 38–126)
Anion gap: 12 (ref 5–15)
BUN: 14 mg/dL (ref 6–20)
CO2: 23 mmol/L (ref 22–32)
Calcium: 8.8 mg/dL — ABNORMAL LOW (ref 8.9–10.3)
Chloride: 97 mmol/L — ABNORMAL LOW (ref 98–111)
Creatinine, Ser: 1.38 mg/dL — ABNORMAL HIGH (ref 0.61–1.24)
GFR calc Af Amer: >60 mL/min (ref >60)
GFR calc non Af Amer: 56 mL/min — ABNORMAL LOW (ref >60)
Glucose, Bld: 121 mg/dL — ABNORMAL HIGH (ref 70–99)
Potassium: 3.9 mmol/L (ref 3.5–5.1)
Sodium: 132 mmol/L — ABNORMAL LOW (ref 135–145)
Total Bilirubin: 0.8 mg/dL (ref 0.3–1.2)
Total Protein: 7.3 g/dL (ref 6.5–8.1)

## 2020-03-10 LAB — CBC WITH DIFFERENTIAL/PLATELET
Abs Immature Granulocytes: 0.07 10*3/uL (ref 0.00–0.07)
Basophils Absolute: 0.1 10*3/uL (ref 0.0–0.1)
Basophils Relative: 1 %
Eosinophils Absolute: 1.4 10*3/uL — ABNORMAL HIGH (ref 0.0–0.5)
Eosinophils Relative: 9 %
HCT: 45.5 % (ref 39.0–52.0)
Hemoglobin: 15.3 g/dL (ref 13.0–17.0)
Immature Granulocytes: 1 %
Lymphocytes Relative: 28 %
Lymphs Abs: 4.2 10*3/uL — ABNORMAL HIGH (ref 0.7–4.0)
MCH: 31.1 pg (ref 26.0–34.0)
MCHC: 33.6 g/dL (ref 30.0–36.0)
MCV: 92.5 fL (ref 80.0–100.0)
Monocytes Absolute: 1.5 10*3/uL — ABNORMAL HIGH (ref 0.1–1.0)
Monocytes Relative: 10 %
Neutro Abs: 8 10*3/uL — ABNORMAL HIGH (ref 1.7–7.7)
Neutrophils Relative %: 51 %
Platelets: 342 10*3/uL (ref 150–400)
RBC: 4.92 MIL/uL (ref 4.22–5.81)
RDW: 13.3 % (ref 11.5–15.5)
WBC: 15.3 10*3/uL — ABNORMAL HIGH (ref 4.0–10.5)
nRBC: 0 % (ref 0.0–0.2)

## 2020-03-10 LAB — PROTIME-INR
INR: 1.2 (ref 0.8–1.2)
Prothrombin Time: 15.2 seconds (ref 11.4–15.2)

## 2020-03-10 LAB — APTT: aPTT: 28 seconds (ref 24–36)

## 2020-03-11 LAB — SARS CORONAVIRUS 2 (TAT 6-24 HRS): SARS Coronavirus 2: NEGATIVE

## 2020-03-13 ENCOUNTER — Ambulatory Visit (HOSPITAL_COMMUNITY): Payer: Managed Care, Other (non HMO) | Admitting: Anesthesiology

## 2020-03-13 ENCOUNTER — Encounter: Payer: Self-pay | Admitting: Internal Medicine

## 2020-03-13 ENCOUNTER — Encounter (HOSPITAL_COMMUNITY): Payer: Self-pay | Admitting: Internal Medicine

## 2020-03-13 ENCOUNTER — Encounter (HOSPITAL_COMMUNITY)
Admission: RE | Disposition: A | Discharge status: Home / Self Care | Payer: Self-pay | Source: Home / Self Care | Attending: Internal Medicine

## 2020-03-13 ENCOUNTER — Ambulatory Visit (HOSPITAL_COMMUNITY)
Admission: RE | Admit: 2020-03-13 | Discharge: 2020-03-13 | Disposition: A | Discharge status: Home / Self Care | Payer: Managed Care, Other (non HMO) | Attending: Internal Medicine | Admitting: Internal Medicine

## 2020-03-13 DIAGNOSIS — K2950 Unspecified chronic gastritis without bleeding: Secondary | ICD-10-CM | POA: Diagnosis not present

## 2020-03-13 DIAGNOSIS — D124 Benign neoplasm of descending colon: Secondary | ICD-10-CM | POA: Diagnosis not present

## 2020-03-13 DIAGNOSIS — R1310 Dysphagia, unspecified: Secondary | ICD-10-CM | POA: Diagnosis not present

## 2020-03-13 DIAGNOSIS — K449 Diaphragmatic hernia without obstruction or gangrene: Secondary | ICD-10-CM | POA: Insufficient documentation

## 2020-03-13 DIAGNOSIS — Z9049 Acquired absence of other specified parts of digestive tract: Secondary | ICD-10-CM | POA: Insufficient documentation

## 2020-03-13 DIAGNOSIS — R1314 Dysphagia, pharyngoesophageal phase: Secondary | ICD-10-CM | POA: Diagnosis present

## 2020-03-13 DIAGNOSIS — Z79899 Other long term (current) drug therapy: Secondary | ICD-10-CM | POA: Insufficient documentation

## 2020-03-13 DIAGNOSIS — E1140 Type 2 diabetes mellitus with diabetic neuropathy, unspecified: Secondary | ICD-10-CM | POA: Insufficient documentation

## 2020-03-13 DIAGNOSIS — D122 Benign neoplasm of ascending colon: Secondary | ICD-10-CM | POA: Diagnosis not present

## 2020-03-13 DIAGNOSIS — Z7984 Long term (current) use of oral hypoglycemic drugs: Secondary | ICD-10-CM | POA: Diagnosis not present

## 2020-03-13 DIAGNOSIS — G2581 Restless legs syndrome: Secondary | ICD-10-CM | POA: Insufficient documentation

## 2020-03-13 DIAGNOSIS — K635 Polyp of colon: Secondary | ICD-10-CM | POA: Diagnosis not present

## 2020-03-13 DIAGNOSIS — F17210 Nicotine dependence, cigarettes, uncomplicated: Secondary | ICD-10-CM | POA: Diagnosis not present

## 2020-03-13 DIAGNOSIS — Z1211 Encounter for screening for malignant neoplasm of colon: Secondary | ICD-10-CM | POA: Diagnosis not present

## 2020-03-13 DIAGNOSIS — I10 Essential (primary) hypertension: Secondary | ICD-10-CM | POA: Insufficient documentation

## 2020-03-13 DIAGNOSIS — M1990 Unspecified osteoarthritis, unspecified site: Secondary | ICD-10-CM | POA: Diagnosis not present

## 2020-03-13 DIAGNOSIS — E785 Hyperlipidemia, unspecified: Secondary | ICD-10-CM | POA: Diagnosis not present

## 2020-03-13 DIAGNOSIS — K5730 Diverticulosis of large intestine without perforation or abscess without bleeding: Secondary | ICD-10-CM | POA: Insufficient documentation

## 2020-03-13 DIAGNOSIS — Z86010 Personal history of colonic polyps: Secondary | ICD-10-CM

## 2020-03-13 DIAGNOSIS — G4730 Sleep apnea, unspecified: Secondary | ICD-10-CM | POA: Diagnosis not present

## 2020-03-13 DIAGNOSIS — Z8249 Family history of ischemic heart disease and other diseases of the circulatory system: Secondary | ICD-10-CM | POA: Insufficient documentation

## 2020-03-13 DIAGNOSIS — K319 Disease of stomach and duodenum, unspecified: Secondary | ICD-10-CM | POA: Insufficient documentation

## 2020-03-13 HISTORY — PX: COLONOSCOPY WITH PROPOFOL: SHX5780

## 2020-03-13 HISTORY — PX: BIOPSY: SHX5522

## 2020-03-13 HISTORY — PX: ESOPHAGOGASTRODUODENOSCOPY (EGD) WITH PROPOFOL: SHX5813

## 2020-03-13 HISTORY — PX: MALONEY DILATION: SHX5535

## 2020-03-13 HISTORY — PX: POLYPECTOMY: SHX5525

## 2020-03-13 LAB — GLUCOSE, CAPILLARY
Glucose-Capillary: 107 mg/dL — ABNORMAL HIGH (ref 70–99)
Glucose-Capillary: 138 mg/dL — ABNORMAL HIGH (ref 70–99)

## 2020-03-13 SURGERY — COLONOSCOPY WITH PROPOFOL
Anesthesia: General

## 2020-03-13 MED ORDER — STERILE WATER FOR IRRIGATION IR SOLN
Status: DC | PRN
  Administered 2020-03-13: 1.5 mL

## 2020-03-13 MED ORDER — LACTATED RINGERS IV SOLN
Freq: Once | INTRAVENOUS | Status: AC
  Administered 2020-03-13: 1000 mL via INTRAVENOUS

## 2020-03-13 MED ORDER — LIDOCAINE 2% (20 MG/ML) 5 ML SYRINGE
INTRAMUSCULAR | Status: AC
  Filled 2020-03-13: qty 20

## 2020-03-13 MED ORDER — LIDOCAINE VISCOUS HCL 2 % MT SOLN
15.0000 mL | Freq: Once | OROMUCOSAL | Status: AC
  Administered 2020-03-13: 15 mL via OROMUCOSAL

## 2020-03-13 MED ORDER — PROPOFOL 10 MG/ML IV BOLUS
INTRAVENOUS | Status: DC | PRN
  Administered 2020-03-13 (×2): 40 mg via INTRAVENOUS

## 2020-03-13 MED ORDER — CHLORHEXIDINE GLUCONATE CLOTH 2 % EX PADS: 6.0000 | MEDICATED_PAD | Freq: Once | CUTANEOUS | Status: DC

## 2020-03-13 MED ORDER — IPRATROPIUM-ALBUTEROL 0.5-2.5 (3) MG/3ML IN SOLN
3.0000 mL | Freq: Once | RESPIRATORY_TRACT | Status: AC
  Administered 2020-03-13: 3 mL via RESPIRATORY_TRACT

## 2020-03-13 MED ORDER — IPRATROPIUM-ALBUTEROL 0.5-2.5 (3) MG/3ML IN SOLN
RESPIRATORY_TRACT | Status: AC
  Filled 2020-03-13: qty 3

## 2020-03-13 MED ORDER — KETAMINE HCL 10 MG/ML IJ SOLN
INTRAMUSCULAR | Status: DC | PRN
  Administered 2020-03-13 (×2): 10 mg via INTRAVENOUS

## 2020-03-13 MED ORDER — GLYCOPYRROLATE 0.2 MG/ML IJ SOLN
0.2000 mg | Freq: Once | INTRAMUSCULAR | Status: AC
  Administered 2020-03-13: 0.2 mg via INTRAVENOUS

## 2020-03-13 MED ORDER — LIDOCAINE VISCOUS HCL 2 % MT SOLN
OROMUCOSAL | Status: AC
  Filled 2020-03-13: qty 15

## 2020-03-13 MED ORDER — PROPOFOL 10 MG/ML IV BOLUS
INTRAVENOUS | Status: AC
  Filled 2020-03-13: qty 240

## 2020-03-13 MED ORDER — GLYCOPYRROLATE 0.2 MG/ML IJ SOLN
INTRAMUSCULAR | Status: AC
  Filled 2020-03-13: qty 1

## 2020-03-13 MED ORDER — METOPROLOL TARTRATE 5 MG/5ML IV SOLN
INTRAVENOUS | Status: DC | PRN
  Administered 2020-03-13: 2 mg via INTRAVENOUS

## 2020-03-13 MED ORDER — LACTATED RINGERS IV SOLN: INTRAVENOUS | Status: DC | PRN

## 2020-03-13 MED ORDER — KETAMINE HCL 50 MG/5ML IJ SOSY
PREFILLED_SYRINGE | INTRAMUSCULAR | Status: AC
  Filled 2020-03-13: qty 5

## 2020-03-13 MED ORDER — PHENYLEPHRINE HCL (PRESSORS) 10 MG/ML IV SOLN
INTRAVENOUS | Status: DC | PRN
  Administered 2020-03-13: 80 ug via INTRAVENOUS

## 2020-03-13 MED ORDER — PROPOFOL 500 MG/50ML IV EMUL
INTRAVENOUS | Status: DC | PRN
  Administered 2020-03-13: 125 ug/kg/min via INTRAVENOUS

## 2020-03-13 NOTE — Discharge Instructions (Signed)
°Colonoscopy °Discharge Instructions ° °Read the instructions outlined below and refer to this sheet in the next few weeks. These discharge instructions provide you with general information on caring for yourself after you leave the hospital. Your doctor may also give you specific instructions. While your treatment has been planned according to the most current medical practices available, unavoidable complications occasionally occur. If you have any problems or questions after discharge, call Dr. Rourk at [phone]. °ACTIVITY °· You may resume your regular activity, but move at a slower pace for the next 24 hours.  °· Take frequent rest periods for the next 24 hours.  °· Walking will help get rid of the air and reduce the bloated feeling in your belly (abdomen).  °· No driving for 24 hours (because of the medicine (anesthesia) used during the test).   °· Do not sign any important legal documents or operate any machinery for 24 hours (because of the anesthesia used during the test).  °NUTRITION °· Drink plenty of fluids.  °· You may resume your normal diet as instructed by your doctor.  °· Begin with a light meal and progress to your normal diet. Heavy or fried foods are harder to digest and may make you feel sick to your stomach (nauseated).  °· Avoid alcoholic beverages for 24 hours or as instructed.  °MEDICATIONS °· You may resume your normal medications unless your doctor tells you otherwise.  °WHAT YOU CAN EXPECT TODAY °· Some feelings of bloating in the abdomen.  °· Passage of more gas than usual.  °· Spotting of blood in your stool or on the toilet paper.  °IF YOU HAD POLYPS REMOVED DURING THE COLONOSCOPY: °· No aspirin products for 7 days or as instructed.  °· No alcohol for 7 days or as instructed.  °· Eat a soft diet for the next 24 hours.  °FINDING OUT THE RESULTS OF YOUR TEST °Not all test results are available during your visit. If your test results are not back during the visit, make an appointment  with your caregiver to find out the results. Do not assume everything is normal if you have not heard from your caregiver or the medical facility. It is important for you to follow up on all of your test results.  °SEEK IMMEDIATE MEDICAL ATTENTION IF: °· You have more than a spotting of blood in your stool.  °· Your belly is swollen (abdominal distention).  °· You are nauseated or vomiting.  °· You have a temperature over 101.  °· You have abdominal pain or discomfort that is severe or gets worse throughout the day.  °EGD °Discharge instructions °Please read the instructions outlined below and refer to this sheet in the next few weeks. These discharge instructions provide you with general information on caring for yourself after you leave the hospital. Your doctor may also give you specific instructions. While your treatment has been planned according to the most current medical practices available, unavoidable complications occasionally occur. If you have any problems or questions after discharge, please call your doctor. °ACTIVITY °· You may resume your regular activity but move at a slower pace for the next 24 hours.  °· Take frequent rest periods for the next 24 hours.  °· Walking will help expel (get rid of) the air and reduce the bloated feeling in your abdomen.  °· No driving for 24 hours (because of the anesthesia (medicine) used during the test).  °· You may shower.  °· Do not sign any important   legal documents or operate any machinery for 24 hours (because of the anesthesia used during the test).  NUTRITION  Drink plenty of fluids.   You may resume your normal diet.   Begin with a light meal and progress to your normal diet.   Avoid alcoholic beverages for 24 hours or as instructed by your caregiver.  MEDICATIONS  You may resume your normal medications unless your caregiver tells you otherwise.  WHAT YOU CAN EXPECT TODAY  You may experience abdominal discomfort such as a feeling of fullness  or gas pains.  FOLLOW-UP  Your doctor will discuss the results of your test with you.  SEEK IMMEDIATE MEDICAL ATTENTION IF ANY OF THE FOLLOWING OCCUR:  Excessive nausea (feeling sick to your stomach) and/or vomiting.   Severe abdominal pain and distention (swelling).   Trouble swallowing.   Temperature over 101 F (37.8 C).   Rectal bleeding or vomiting of blood.   GERD and and polyp information provided  Begin Protonix 40 mg daily  Further recommendations to follow pending review of pathology report  Office follow-up with [name] in 3 months  Patient request, I called to Jason French at [phone] and reviewed findings and recommendations     Gastroesophageal Reflux Disease, Adult Gastroesophageal reflux (GER) happens when acid from the stomach flows up into the tube that connects the mouth and the stomach (esophagus). Normally, food travels down the esophagus and stays in the stomach to be digested. With GER, food and stomach acid sometimes move back up into the esophagus. You may have a disease called gastroesophageal reflux disease (GERD) if the reflux:  Happens often.  Causes frequent or very bad symptoms.  Causes problems such as damage to the esophagus. When this happens, the esophagus becomes sore and swollen (inflamed). Over time, GERD can make small holes (ulcers) in the lining of the esophagus. What are the causes? This condition is caused by a problem with the muscle between the esophagus and the stomach. When this muscle is weak or not normal, it does not close properly to keep food and acid from coming back up from the stomach. The muscle can be weak because of:  Tobacco use.  Pregnancy.  Having a certain type of hernia (hiatal hernia).  Alcohol use.  Certain foods and drinks, such as coffee, chocolate, onions, and peppermint. What increases the risk? You are more likely to develop this condition if you:  Are overweight.  Have a disease that  affects your connective tissue.  Use NSAID medicines. What are the signs or symptoms? Symptoms of this condition include:  Heartburn.  Difficult or painful swallowing.  The feeling of having a lump in the throat.  A bitter taste in the mouth.  Bad breath.  Having a lot of saliva.  Having an upset or bloated stomach.  Belching.  Chest pain. Different conditions can cause chest pain. Make sure you see your doctor if you have chest pain.  Shortness of breath or noisy breathing (wheezing).  Ongoing (chronic) cough or a cough at night.  Wearing away of the surface of teeth (tooth enamel).  Weight loss. How is this treated? Treatment will depend on how bad your symptoms are. Your doctor may suggest:  Changes to your diet.  Medicine.  Surgery. Follow these instructions at home: Eating and drinking   Follow a diet as told by your doctor. You may need to avoid foods and drinks such as: ? Coffee and tea (with or without caffeine). ? Drinks that contain  alcohol. ? Energy drinks and sports drinks. ? Bubbly (carbonated) drinks or sodas. ? Chocolate and cocoa. ? Peppermint and mint flavorings. ? Garlic and onions. ? Horseradish. ? Spicy and acidic foods. These include peppers, chili powder, curry powder, vinegar, hot sauces, and BBQ sauce. ? Citrus fruit juices and citrus fruits, such as oranges, lemons, and limes. ? Tomato-based foods. These include red sauce, chili, salsa, and pizza with red sauce. ? Fried and fatty foods. These include donuts, french fries, potato chips, and high-fat dressings. ? High-fat meats. These include hot dogs, rib eye steak, sausage, ham, and bacon. ? High-fat dairy items, such as whole milk, butter, and cream cheese.  Eat small meals often. Avoid eating large meals.  Avoid drinking large amounts of liquid with your meals.  Avoid eating meals during the 2-3 hours before bedtime.  Avoid lying down right after you eat.  Do not exercise  right after you eat. Lifestyle   Do not use any products that contain nicotine or tobacco. These include cigarettes, e-cigarettes, and chewing tobacco. If you need help quitting, ask your doctor.  Try to lower your stress. If you need help doing this, ask your doctor.  If you are overweight, lose an amount of weight that is healthy for you. Ask your doctor about a safe weight loss goal. General instructions  Pay attention to any changes in your symptoms.  Take over-the-counter and prescription medicines only as told by your doctor. Do not take aspirin, ibuprofen, or other NSAIDs unless your doctor says it is okay.  Wear loose clothes. Do not wear anything tight around your waist.  Raise (elevate) the head of your bed about 6 inches (15 cm).  Avoid bending over if this makes your symptoms worse.  Keep all follow-up visits as told by your doctor. This is important. Contact a doctor if:  You have new symptoms.  You lose weight and you do not know why.  You have trouble swallowing or it hurts to swallow.  You have wheezing or a cough that keeps happening.  Your symptoms do not get better with treatment.  You have a hoarse voice. Get help right away if:  You have pain in your arms, neck, jaw, teeth, or back.  You feel sweaty, dizzy, or light-headed.  You have chest pain or shortness of breath.  You throw up (vomit) and your throw-up looks like blood or coffee grounds.  You pass out (faint).  Your poop (stool) is bloody or black.  You cannot swallow, drink, or eat. Summary  If a person has gastroesophageal reflux disease (GERD), food and stomach acid move back up into the esophagus and cause symptoms or problems such as damage to the esophagus.  Treatment will depend on how bad your symptoms are.  Follow a diet as told by your doctor.  Take all medicines only as told by your doctor. This information is not intended to replace advice given to you by your health  care provider. Make sure you discuss any questions you have with your health care provider. Document Revised: 01/21/2018 Document Reviewed: 01/21/2018 Elsevier Patient Education  [place].    Colon Polyps  Polyps are tissue growths inside the body. Polyps can grow in many places, including the large intestine (colon). A polyp may be a round bump or a mushroom-shaped growth. You could have one polyp or several. Most colon polyps are noncancerous (benign). However, some colon polyps can become cancerous over time. Finding and removing the polyps early  can help prevent this. What are the causes? The exact cause of colon polyps is not known. What increases the risk? You are more likely to develop this condition if you:  Have a family history of colon cancer or colon polyps.  Are older than 5 or older than 45 if you are African American.  Have inflammatory bowel disease, such as ulcerative colitis or Crohn's disease.  Have certain hereditary conditions, such as: ? Familial adenomatous polyposis. ? Lynch syndrome. ? Turcot syndrome. ? Peutz-Jeghers syndrome.  Are overweight.  Smoke cigarettes.  Do not get enough exercise.  Drink too much alcohol.  Eat a diet that is high in fat and red meat and low in fiber.  Had childhood cancer that was treated with abdominal radiation. What are the signs or symptoms? Most polyps do not cause symptoms. If you have symptoms, they may include:  Blood coming from your rectum when having a bowel movement.  Blood in your stool. The stool may look dark red or black.  Abdominal pain.  A change in bowel habits, such as constipation or diarrhea. How is this diagnosed? This condition is diagnosed with a colonoscopy. This is a procedure in which a lighted, flexible scope is inserted into the anus and then passed into the colon to examine the area. Polyps are sometimes found when a colonoscopy is done as part of routine cancer screening  tests. How is this treated? Treatment for this condition involves removing any polyps that are found. Most polyps can be removed during a colonoscopy. Those polyps will then be tested for cancer. Additional treatment may be needed depending on the results of testing. Follow these instructions at home: Lifestyle  Maintain a healthy weight, or lose weight if recommended by your health care provider.  Exercise every day or as told by your health care provider.  Do not use any products that contain nicotine or tobacco, such as cigarettes and e-cigarettes. If you need help quitting, ask your health care provider.  If you drink alcohol, limit how much you have: ? 0-1 drink a day for women. ? 0-2 drinks a day for men.  Be aware of how much alcohol is in your drink. In the [place] one drink equals one 12 oz bottle of beer (355 mL), one 5 oz glass of wine (148 mL), or one 1 oz shot of hard liquor (44 mL). Eating and drinking   Eat foods that are high in fiber, such as fruits, vegetables, and whole grains.  Eat foods that are high in calcium and vitamin D, such as milk, cheese, yogurt, eggs, liver, fish, and broccoli.  Limit foods that are high in fat, such as fried foods and desserts.  Limit the amount of red meat and processed meat you eat, such as hot dogs, sausage, bacon, and lunch meats. General instructions  Keep all follow-up visits as told by your health care provider. This is important. ? This includes having regularly scheduled colonoscopies. ? Talk to your health care provider about when you need a colonoscopy. Contact a health care provider if:  You have new or worsening bleeding during a bowel movement.  You have new or increased blood in your stool.  You have a change in bowel habits.  You lose weight for no known reason. Summary  Polyps are tissue growths inside the body. Polyps can grow in many places, including the colon.  Most colon polyps are noncancerous  (benign), but some can become cancerous over time.  This condition  is diagnosed with a colonoscopy.  Treatment for this condition involves removing any polyps that are found. Most polyps can be removed during a colonoscopy. This information is not intended to replace advice given to you by your health care provider. Make sure you discuss any questions you have with your health care provider. Document Revised: 10/30/2017 Document Reviewed: 10/30/2017 Elsevier Patient Education  [place] These instructions provide you with information about caring for yourself after your procedure. Your health care provider may also give you more specific instructions. Your treatment has been planned according to current medical practices, but problems sometimes occur. Call your health care provider if you have any problems or questions after your procedure. What can I expect after the procedure? After your procedure, you may:  Feel sleepy for several hours.  Feel clumsy and have poor balance for several hours.  Feel forgetful about what happened after the procedure.  Have poor judgment for several hours.  Feel nauseous or vomit.  Have a sore throat if you had a breathing tube during the procedure. Follow these instructions at home: For at least 24 hours after the procedure:      Have a responsible adult stay with you. It is important to have someone help care for you until you are awake and alert.  Rest as needed.  Do not: ? Participate in activities in which you could fall or become injured. ? Drive. ? Use heavy machinery. ? Drink alcohol. ? Take sleeping pills or medicines that cause drowsiness. ? Make important decisions or sign legal documents. ? Take care of children on your own. Eating and drinking  Follow the diet that is recommended by your health care provider.  If you vomit, drink water, juice, or soup when you can drink  without vomiting.  Make sure you have little or no nausea before eating solid foods. General instructions  Take over-the-counter and prescription medicines only as told by your health care provider.  If you have sleep apnea, surgery and certain medicines can increase your risk for breathing problems. Follow instructions from your health care provider about wearing your sleep device: ? Anytime you are sleeping, including during daytime naps. ? While taking prescription pain medicines, sleeping medicines, or medicines that make you drowsy.  If you smoke, do not smoke without supervision.  Keep all follow-up visits as told by your health care provider. This is important. Contact a health care provider if:  You keep feeling nauseous or you keep vomiting.  You feel light-headed.  You develop a rash.  You have a fever. Get help right away if:  You have trouble breathing. Summary  For several hours after your procedure, you may feel sleepy and have poor judgment.  Have a responsible adult stay with you for at least 24 hours or until you are awake and alert. This information is not intended to replace advice given to you by your health care provider. Make sure you discuss any questions you have with your health care provider. Document Revised: 10/13/2017 Document Reviewed: 11/05/2015 Elsevier Patient Education  [place].

## 2020-03-13 NOTE — Anesthesia Postprocedure Evaluation (Signed)
Anesthesia Post Note  Patient: [name]  Procedure(s) Performed: COLONOSCOPY WITH PROPOFOL (N/A ) ESOPHAGOGASTRODUODENOSCOPY (EGD) WITH PROPOFOL (N/A ) MALONEY DILATION (N/A ) POLYPECTOMY BIOPSY  Patient location during evaluation: PACU Anesthesia Type: General Level of consciousness: awake and alert Pain management: pain level controlled Vital Signs Assessment: post-procedure vital signs reviewed and stable Respiratory status: spontaneous breathing Cardiovascular status: stable Postop Assessment: no apparent nausea or vomiting Anesthetic complications: no   No complications documented.   Last Vitals:  Vitals:   03/13/20 0658  BP: (!) 139/97  Pulse: 96  Resp: (!) 22  Temp: 37.2 C    Last Pain:  Vitals:   03/13/20 0739  TempSrc:   PainSc: 0-No pain                 Everette Rank

## 2020-03-13 NOTE — Transfer of Care (Signed)
Immediate Anesthesia Transfer of Care Note  Patient: Jason French  Procedure(s) Performed: COLONOSCOPY WITH PROPOFOL (N/A ) ESOPHAGOGASTRODUODENOSCOPY (EGD) WITH PROPOFOL (N/A ) MALONEY DILATION (N/A ) POLYPECTOMY BIOPSY  Patient Location: PACU  Anesthesia Type:MAC and General  Level of Consciousness: awake, alert , oriented and patient cooperative  Airway & Oxygen Therapy: Patient Spontanous Breathing  Post-op Assessment: Report given to RN and Post -op Vital signs reviewed and stable  Post vital signs: Reviewed and stable  Last Vitals:  Vitals Value Taken Time  BP    Temp    Pulse 110 03/13/20 0834  Resp 15 03/13/20 0834  SpO2 95 % 03/13/20 0834  Vitals shown include unvalidated device data.  Last Pain:  Vitals:   03/13/20 0739  TempSrc:   PainSc: 0-No pain      Patients Stated Pain Goal: 5 (88/28/00 3491)  Complications: No complications documented.

## 2020-03-13 NOTE — Anesthesia Preprocedure Evaluation (Signed)
Anesthesia Evaluation  Patient identified by MRN, date of birth, ID band Patient awake    Reviewed: Allergy & Precautions, NPO status , Patient's Chart, lab work & pertinent test results  Airway Mallampati: III  TM Distance: >3 FB Neck ROM: Full    Dental  (+) Dental Advisory Given, Missing   Pulmonary sleep apnea and Continuous Positive Airway Pressure Ventilation , Current Smoker and Patient abstained from smoking.,   Room air saturation 89 to 93. Will give duoneb treatment before the procedure. Lungs clear. Pulmonary exam normal breath sounds clear to auscultation       Cardiovascular Exercise Tolerance: Good hypertension, Pt. on medications Normal cardiovascular exam Rhythm:Regular Rate:Tachycardia  10-Mar-2020 14:55:06 [facility]-OPS ROUTINE RECORD Sinus tachycardia with Premature atrial complexes Otherwise normal ECG Confirmed by Asencion Noble [phone]) on 03/12/2020 11:10:25 PM   Neuro/Psych    GI/Hepatic negative GI ROS, (+)     substance abuse  alcohol use,   Endo/Other  diabetes, Well Controlled, Type 2  Renal/GU Renal InsufficiencyRenal disease     Musculoskeletal  (+) Arthritis , Osteoarthritis,    Abdominal   Peds  Hematology  (+) Blood dyscrasia (prolonged PT), ,   Anesthesia Other Findings   Reproductive/Obstetrics                           Anesthesia Physical Anesthesia Plan  ASA: III  Anesthesia Plan: General   Post-op Pain Management:    Induction: Intravenous  PONV Risk Score and Plan: 3 and TIVA  Airway Management Planned: Nasal Cannula, Natural Airway and Simple Face Mask  Additional Equipment:   Intra-op Plan:   Post-operative Plan:   Informed Consent: I have reviewed the patients History and Physical, chart, labs and discussed the procedure including the risks, benefits and alternatives for the proposed anesthesia with the patient or  authorized representative who has indicated his/her understanding and acceptance.     Dental advisory given  Plan Discussed with: CRNA and Surgeon  Anesthesia Plan Comments: (Risk of renal function getting worse after the anesthesia was explained)       Anesthesia Quick Evaluation

## 2020-03-13 NOTE — Op Note (Signed)
[facility] Patient Name: Jason French Procedure Date: 03/13/2020 8:08 AM MRN: [id number] Date of Birth: Apr 27, 1961 Attending MD: Norvel Richards , MD CSN: [id number] Age: 59 Admit Type: Outpatient Procedure:                Upper GI endoscopy Indications:              Dysphagia Providers:                Norvel Richards, MD, Charlsie Quest. Theda Sers RN, RN,                            Crystal Page, Nelma Rothman, Technician Referring MD:              Medicines:                Propofol per Anesthesia Complications:            No immediate complications. Estimated Blood Loss:     Estimated blood loss was minimal. Procedure:                Pre-Anesthesia Assessment:                           - Prior to the procedure, a History and Physical                            was performed, and patient medications and                            allergies were reviewed. The patient's tolerance of                            previous anesthesia was also reviewed. The risks                            and benefits of the procedure and the sedation                            options and risks were discussed with the patient.                            All questions were answered, and informed consent                            was obtained. Prior Anticoagulants: The patient has                            taken no previous anticoagulant or antiplatelet                            agents. ASA Grade Assessment: III - A patient with                            severe systemic disease. After reviewing the risks  and benefits, the patient was deemed in                            satisfactory condition to undergo the procedure.                           After obtaining informed consent, the endoscope was                            passed under direct vision. Throughout the                            procedure, the patient's blood pressure, pulse, and                            oxygen  saturations were monitored continuously. The                            [id number]-H190 ([id number]) scope was introduced through the                            mouth, and advanced to the second part of duodenum.                            The upper GI endoscopy was accomplished without                            difficulty. The patient tolerated the procedure                            well. Scope In: 8:16:12 AM Scope Out: 8:24:47 AM Total Procedure Duration: 0 hours 8 minutes 35 seconds  Findings:      The examined esophagus was normal.      A small hiatal hernia was present. Patchy erythema and superficial       erosions in the antrum. No ulcer or infiltrating process.      The duodenal bulb and second portion of the duodenum were normal. The       scope was withdrawn. Dilation was performed with a Maloney dilator with       mild resistance at 56 Fr. Dilation was performed with a Maloney dilator       with mild resistance at 61 Fr. The dilation site was examined following       endoscope reinsertion and showed no change. Estimated blood loss was       minimal. Finally, the abnormal gastric mucosa was biopsied. Impression:               - Normal esophagus. Dilated.                           - Small hiatal hernia. Gastric erosions?"status post                            biopsy                           -  Normal duodenal bulb and second portion of the                            duodenum. Moderate Sedation:      Moderate (conscious) sedation was personally administered by an       anesthesia professional. The following parameters were monitored: oxygen       saturation, heart rate, blood pressure, respiratory rate, EKG, adequacy       of pulmonary ventilation, and response to care. Recommendation:           - Patient has a contact number available for                            emergencies. The signs and symptoms of potential                            delayed complications were discussed with  the                            patient. Return to normal activities tomorrow.                            Written discharge instructions were provided to the                            patient.                           - Advance diet as tolerated. Trial of Protonix 40                            mg daily. Follow-up on pathology. Office visit with                            [name] in 3 months. See colonoscopy report. Procedure Code(s):        --- Professional ---                           [phone], Esophagogastroduodenoscopy, flexible,                            transoral; diagnostic, including collection of                            specimen(s) by brushing or washing, when performed                            (separate procedure)                           43450, Dilation of esophagus, by unguided sound or                            bougie, single or multiple passes Diagnosis Code(s):        --- Professional ---  K44.9, Diaphragmatic hernia without obstruction or                            gangrene                           R13.10, Dysphagia, unspecified CPT copyright 2019 American Medical Association. All rights reserved. The codes documented in this report are preliminary and upon coder review may  be revised to meet current compliance requirements. Cristopher Estimable. Hanz Winterhalter, MD Norvel Richards, MD 03/13/2020 8:32:06 AM This report has been signed electronically. Number of Addenda: 0

## 2020-03-13 NOTE — H&P (Signed)
@LOGO @   Primary Care Physician:  Susy Frizzle, MD Primary Gastroenterologist:  Dr. Gala Romney  Pre-Procedure History & Physical: HPI:  Jason French is a 59 y.o. male here for further evaluation of esophageal dysphagia .  History of colonic adenoma intermittent rectal bleeding here for diagnostic colonoscopy as well.  History of prolonged bleeding time but no bleeding diathesis clinically.  Has had polypectomies in the past and other certain procedures without any difficulties.  Incidentally, patient denies any GERD symptoms.  Past Medical History:  Diagnosis Date  . Arthritis   . Cancer [facility])    adrenal cancer  . Diabetes mellitus without complication ([place])   . Diverticulitis 2006   s/p sigmoid colectomy, continues with scattered pancolinic diverticula  . Hyperlipidemia   . Hypertension   . Neuropathy   . RLS (restless legs syndrome)   . Sleep apnea    use CPAP    Past Surgical History:  Procedure Laterality Date  . COLON SURGERY  2006   ruptured diverticulosis; sigmoid resection.   . COLONOSCOPY  10/26/2004   Dr. Gala Romney; internal hemorrhoids, few scattered pancolonic diverticula, 3 left colon polyps.  Cannot locate path in chart.  . COLONOSCOPY  03/19/2010   Friable anal canal, diminutive polyps about the sigmoid and anastomosis at 30 cm ablated and/or snare removed, multiple polyps from ileocecal valve biopsied/ ablated/or snared.  Residual scattered pancolonic diverticula.  Suspected rectal bleeding from anorectum in the setting of IBS.  Ileocecal pathology with tubular adenomas, otherwise hyperplastic polyps/benign small bowel mucosa. 7 yr repeat  . KNEE ARTHROSCOPY WITH MEDIAL MENISECTOMY Right 06/12/2017   Procedure: KNEE ARTHROSCOPY WITH PARTIAL MEDIAL MENISECTOMY;  Surgeon: [name], MD;  Location: [facility];  Service: Orthopedics;  Laterality: Right;  . RESECTION OF ABDOMINAL MASS     adrenal mass right (cancer)  . TOTAL KNEE ARTHROPLASTY Right 07/13/2018    Procedure: RIGHT TOTAL KNEE ARTHROPLASTY,INJECTION OF LEFT KNEE;  Surgeon: Gaynelle Arabian, MD;  Location: [facility];  Service: Orthopedics;  Laterality: Right;  47min    Prior to Admission medications   Medication Sig Start Date End Date Taking? Authorizing Provider  atorvastatin (LIPITOR) 10 MG tablet TAKE (1) TABLET BY MOUTH AT BEDTIME. Patient taking differently: Take 10 mg by mouth at bedtime. TAKE (1) TABLET BY MOUTH AT BEDTIME. 06/10/19  Yes Susy Frizzle, MD  benazepril (LOTENSIN) 40 MG tablet Take 1 tablet (40 mg total) by mouth daily. 06/10/19  Yes Susy Frizzle, MD  gabapentin (NEURONTIN) 300 MG capsule TAKE 2 CAPSULES BY MOUTH THREE TIMES A DAY. Patient taking differently: Take 600 mg by mouth 3 (three) times daily. TAKE 2 CAPSULES BY MOUTH THREE TIMES A DAY. 06/10/19  Yes Susy Frizzle, MD  hydrochlorothiazide (HYDRODIURIL) 25 MG tablet TAKE (1) TABLET BY MOUTH ONCE DAILY. Patient taking differently: Take 25 mg by mouth daily. TAKE (1) TABLET BY MOUTH ONCE DAILY. 06/10/19  Yes Susy Frizzle, MD  metFORMIN (GLUCOPHAGE) 1000 MG tablet Take 1 tablet (1,000 mg total) by mouth 2 (two) times daily with a meal. 06/10/19  Yes [name], [name], MD  rOPINIRole (REQUIP) 1 MG tablet TAKE (2) TABLETS BY MOUTH AT BEDTIME. Patient taking differently: Take 1 mg by mouth at bedtime.  02/02/20  Yes Susy Frizzle, MD  albuterol (PROVENTIL HFA;VENTOLIN HFA) 108 (90 Base) MCG/ACT inhaler Inhale 2 puffs into the lungs every 6 (six) hours as needed for wheezing or shortness of breath. 12/11/17   Susy Frizzle, MD  Allergies as of 02/03/2020  . (No Known Allergies)    Family History  Problem Relation Age of Onset  . Diverticulitis Mother   . Hypertension Mother   . Diverticulitis Father   . Hypertension Father   . Colon cancer Father        late 67s    Social History   Socioeconomic History  . Marital status: Married    Spouse name: Not on file  . Number of children: Not  on file  . Years of education: Not on file  . Highest education level: Not on file  Occupational History  . Not on file  Tobacco Use  . Smoking status: Current Every Day Smoker    Packs/day: 2.00    Years: 40.00    Pack years: 80.00    Types: Cigarettes  . Smokeless tobacco: Never Used  Vaping Use  . Vaping Use: Never used  Substance and Sexual Activity  . Alcohol use: Yes    Comment: once a month  . Drug use: No  . Sexual activity: Yes  Other Topics Concern  . Not on file  Social History Narrative  . Not on file   Social Determinants of Health   Financial Resource Strain:   . Difficulty of Paying Living Expenses:   Food Insecurity:   . Worried About Charity fundraiser in the Last Year:   . Arboriculturist in the Last Year:   Transportation Needs:   . Film/video editor (Medical):   Marland Kitchen Lack of Transportation (Non-Medical):   Physical Activity:   . Days of Exercise per Week:   . Minutes of Exercise per Session:   Stress:   . Feeling of Stress :   Social Connections:   . Frequency of Communication with Friends and Family:   . Frequency of Social Gatherings with Friends and Family:   . Attends Religious Services:   . Active Member of Clubs or Organizations:   . Attends Archivist Meetings:   Marland Kitchen Marital Status:   Intimate Partner Violence:   . Fear of Current or Ex-Partner:   . Emotionally Abused:   Marland Kitchen Physically Abused:   . Sexually Abused:     Review of Systems: See HPI, otherwise negative ROS  Physical Exam: BP (!) 139/97   Pulse 96   Temp 98.9 F (37.2 C) (Oral)   Resp (!) 22  General:   Alert,  Well-developed, well-nourished, pleasant and cooperative in NAD Mouth:  No deformity or lesions. Neck:  Supple; no masses or thyromegaly. No significant cervical adenopathy. Lungs:  Clear throughout to auscultation.   No wheezes, crackles, or rhonchi. No acute distress. Heart:  Regular rate and rhythm; no murmurs, clicks, rubs,  or  gallops. Abdomen: Non-distended, normal bowel sounds.  Soft and nontender without appreciable mass or hepatosplenomegaly.  Pulses:  Normal pulses noted. Extremities:  Without clubbing or edema.  Impression/Plan: 59 year old gentleman chronic intermittent esophageal dysphagia to solids. History of adenoma and intermittent rectal bleeding. Patient is here for EGD with esophageal dilation as feasible/appropriate per plan as well as a diagnostic colonoscopy.The risks, benefits, limitations, imponderables and alternatives regarding both EGD and colonoscopy have been reviewed with the patient. Questions have been answered. All parties agreeable.   Clinical status discussed with Dr. Wyatt Haste.     Notice: This dictation was prepared with Dragon dictation along with smaller phrase technology. Any transcriptional errors that result from this process are unintentional and may not be corrected upon review.

## 2020-03-14 ENCOUNTER — Other Ambulatory Visit: Payer: Self-pay

## 2020-03-14 LAB — SURGICAL PATHOLOGY

## 2020-03-15 ENCOUNTER — Encounter: Payer: Self-pay | Admitting: Internal Medicine

## 2020-03-15 NOTE — Op Note (Signed)
[facility] Patient Name: Jason French Procedure Date: 03/13/2020 7:17 AM MRN: [id number] Date of Birth: January 31, 1961 Attending MD: Norvel Richards , MD CSN: [id number] Age: 59 Admit Type: Outpatient Procedure:                Colonoscopy Indications:              High risk colon cancer surveillance: Personal                            history of colonic polyps Providers:                Norvel Richards, MD, Charlsie Quest. Theda Sers RN, RN,                            [place] Page Referring MD:             [name]. [name] Medicines:                Propofol per Anesthesia Complications:            No immediate complications. Estimated Blood Loss:     Estimated blood loss was minimal. Procedure:                Pre-Anesthesia Assessment:                           - Prior to the procedure, a History and Physical                            was performed, and patient medications and                            allergies were reviewed. The patient's tolerance of                            previous anesthesia was also reviewed. The risks                            and benefits of the procedure and the sedation                            options and risks were discussed with the patient.                            All questions were answered, and informed consent                            was obtained. Prior Anticoagulants: The patient has                            taken no previous anticoagulant or antiplatelet                            agents. ASA Grade Assessment: III - A patient with  severe systemic disease. After reviewing the risks                            and benefits, the patient was deemed in                            satisfactory condition to undergo the procedure.                           After obtaining informed consent, the colonoscope                            was passed under direct vision. Throughout the                             procedure, the patient's blood pressure, pulse, and                            oxygen saturations were monitored continuously. The                            CF-HQ190L ([id number]) scope was introduced through                            the anus and advanced to the the cecum, identified                            by appendiceal orifice and ileocecal valve. The                            colonoscopy was performed without difficulty. The                            patient tolerated the procedure well. The quality                            of the bowel preparation was adequate. Scope In: 7:40:34 AM Scope Out: 8:05:05 AM Scope Withdrawal Time: 0 hours 22 minutes 19 seconds  Total Procedure Duration: 0 hours 24 minutes 31 seconds  Findings:      The perianal and digital rectal examinations were normal.      Scattered small and large-mouthed diverticula were found in the entire       colon.      11 semi-pedunculated polyps were found in the descending colon and       ascending colon. The polyps were 3 to 9 mm in size. These polyps were       removed with a cold snare. Resection and retrieval were complete.       Estimated blood loss was minimal.      The exam was otherwise without abnormality on direct and retroflexion       views. Previously noted surgical anastomosis in apparent today. Impression:               - Diverticulosis in the entire examined colon.                           - (  11) 3 to 9 mm polyps in the descending colon and                            in the ascending colon, removed with a cold snare.                            Resected and retrieved.                           - The examination was otherwise normal on direct                            and retroflexion views. Moderate Sedation:      Moderate (conscious) sedation was personally administered by an       anesthesia professional. The following parameters were monitored: oxygen       saturation, heart rate, blood  pressure, respiratory rate, EKG, adequacy       of pulmonary ventilation, and response to care. Recommendation:           - Patient has a contact number available for                            emergencies. The signs and symptoms of potential                            delayed complications were discussed with the                            patient. Return to normal activities tomorrow.                            Written discharge instructions were provided to the                            patient.                           - Advance diet as tolerated.                           - Continue present medications.                           - Repeat colonoscopy date to be determined after                            pending pathology results are reviewed for                            surveillance based on pathology results.                           - Return to GI office (date not yet determined).  See EGD report Procedure Code(s):        --- Professional ---                           [phone], Colonoscopy, flexible; with removal of                            tumor(s), polyp(s), or other lesion(s) by snare                            technique Diagnosis Code(s):        --- Professional ---                           Z86.010, Personal history of colonic polyps                           K63.5, Polyp of colon                           K57.30, Diverticulosis of large intestine without                            perforation or abscess without bleeding CPT copyright 2019 American Medical Association. All rights reserved. The codes documented in this report are preliminary and upon coder review may  be revised to meet current compliance requirements. Cristopher Estimable. Marni Franzoni, MD Norvel Richards, MD 03/15/2020 12:09:36 PM This report has been signed electronically. Number of Addenda: 0

## 2020-03-16 ENCOUNTER — Encounter (HOSPITAL_COMMUNITY): Payer: Self-pay | Admitting: Internal Medicine

## 2020-03-20 ENCOUNTER — Telehealth: Payer: Self-pay

## 2020-03-20 NOTE — Telephone Encounter (Signed)
Tried to call pt, no answer, LMOVM for return call.  

## 2020-03-20 NOTE — Telephone Encounter (Signed)
Any fever? Did his pain start the same day of his colonoscopy? Is the pain persistent? Has it improved or is it worsening? What is the severity on a scale of 1-10? Does he have improvement when he has a BM?  Is he having soft formed BMs daily? Or does he feel he is having harder stools? He reported history of constipation at his last office visit and I recommended benefiber or metamucil daily and MiraLAX as needed.

## 2020-03-20 NOTE — Telephone Encounter (Signed)
Pt called office, he has been having abdominal pain that started after having TCS 03/13/20. Pain is across lower abd. Feels like pain goes from one hip to the other. Pain is dull and aching. No nausea/vomiting. No blood seen in stool. Last bm was yesterday and solid. States it feels like diverticulitis again.

## 2020-03-21 ENCOUNTER — Telehealth: Payer: Self-pay | Admitting: Internal Medicine

## 2020-03-21 NOTE — Telephone Encounter (Signed)
See other phone note started yesterday.

## 2020-03-21 NOTE — Telephone Encounter (Signed)
Spoke to pt, temp was 99.6 last night. No fever today. Abdominal pain started 03/15/20 (TCS was 03/13/20). Pain is off and on. Hasn't had as much pain today as yesterday. Has some pain relief with BM. Not having daily BM but stool is soft and formed. Pain at this time is 4. He didn't do Metamucil or Benefiber and Miralax as suggested but stated he will start.

## 2020-03-21 NOTE — Telephone Encounter (Signed)
Pt returning call. [phone]

## 2020-03-21 NOTE — Telephone Encounter (Signed)
Noted. Spoke with patient. He is feeling somewhat improved today. His first BM after TCS on 8/16 was 8/22. He had one BM on 8/22 and 1 BM on 8/23. I suspect his pain may be secondary to constipation. He was advised to start MiraLAX daily to see if improving his bowel regularity will improve his abdominal pain. Offered him an OV this week, but he is unable to get off work. Advised he call if symptoms to not improve, and we will try to schedule an appointment for him. Advised to proceed to the emergency room should he have severe abdominal pain or fever.

## 2020-03-25 ENCOUNTER — Other Ambulatory Visit: Payer: Self-pay | Admitting: Family Medicine

## 2020-04-07 ENCOUNTER — Ambulatory Visit (INDEPENDENT_AMBULATORY_CARE_PROVIDER_SITE_OTHER): Payer: Managed Care, Other (non HMO) | Admitting: Family Medicine

## 2020-04-07 ENCOUNTER — Other Ambulatory Visit: Payer: Self-pay

## 2020-04-07 ENCOUNTER — Other Ambulatory Visit: Payer: Self-pay | Admitting: Family Medicine

## 2020-04-07 VITALS — BP 116/80 | HR 99 | Temp 98.6°F | Ht 69.0 in | Wt 255.0 lb

## 2020-04-07 DIAGNOSIS — E1140 Type 2 diabetes mellitus with diabetic neuropathy, unspecified: Secondary | ICD-10-CM

## 2020-04-07 DIAGNOSIS — I10 Essential (primary) hypertension: Secondary | ICD-10-CM

## 2020-04-07 DIAGNOSIS — N189 Chronic kidney disease, unspecified: Secondary | ICD-10-CM | POA: Diagnosis not present

## 2020-04-07 DIAGNOSIS — G629 Polyneuropathy, unspecified: Secondary | ICD-10-CM

## 2020-04-07 DIAGNOSIS — E7800 Pure hypercholesterolemia, unspecified: Secondary | ICD-10-CM | POA: Diagnosis not present

## 2020-04-07 NOTE — Progress Notes (Signed)
Subjective:    Patient ID: Jason French, male    DOB: Oct 13, 1960, 59 y.o.   MRN: [id number]  Medication Refill  Patient is here today for a follow-up of his chronic medical conditions.  Recently had colonoscopy with 11 polyps.  Patient has not had lab work pertaining to his diabetes in almost a year.  He is still taking Metformin.  However he has not been checking his blood sugar.  He is concerned that Metformin may damage his kidneys.  He denies any polyuria, polydipsia, blurry vision.  He denies any chest pain shortness of breath or dyspnea on exertion.  Unfortunately he continues to smoke.  He denies any hemoptysis.  He denies any paroxysmal nocturnal dyspnea.  His blood pressure today is well controlled at 116/80.  Patient has not had his flu shot.  He has not had his Covid vaccination.  I spent 10 minutes today encouraging the patient to receive his Covid vaccination  Past Medical History:  Diagnosis Date  . Arthritis   . Cancer [facility])    adrenal cancer  . Diabetes mellitus without complication ([place])   . Diverticulitis 2006   s/p sigmoid colectomy, continues with scattered pancolinic diverticula  . Hyperlipidemia   . Hypertension   . Neuropathy   . RLS (restless legs syndrome)   . Sleep apnea    use CPAP   Past Surgical History:  Procedure Laterality Date  . BIOPSY  03/13/2020   Procedure: BIOPSY;  Surgeon: Daneil Dolin, MD;  [facility]: [facility];  Service: Endoscopy;;  . COLON SURGERY  2006   ruptured diverticulosis; sigmoid resection.   . COLONOSCOPY  10/26/2004   Dr. Gala Romney; internal hemorrhoids, few scattered pancolonic diverticula, 3 left colon polyps.  Cannot locate path in chart.  . COLONOSCOPY  03/19/2010   Friable anal canal, diminutive polyps about the sigmoid and anastomosis at 30 cm ablated and/or snare removed, multiple polyps from ileocecal valve biopsied/ ablated/or snared.  Residual scattered pancolonic diverticula.  Suspected rectal bleeding from anorectum  in the setting of IBS.  Ileocecal pathology with tubular adenomas, otherwise hyperplastic polyps/benign small bowel mucosa. 7 yr repeat  . COLONOSCOPY WITH PROPOFOL N/A 03/13/2020   Procedure: COLONOSCOPY WITH PROPOFOL;  Surgeon: Daneil Dolin, MD;  [facility]: [facility];  Service: Endoscopy;  Laterality: N/A;  7:30am  . ESOPHAGOGASTRODUODENOSCOPY (EGD) WITH PROPOFOL N/A 03/13/2020   Procedure: ESOPHAGOGASTRODUODENOSCOPY (EGD) WITH PROPOFOL;  Surgeon: Daneil Dolin, MD;  [facility];  Service: Endoscopy;  Laterality: N/A;  . KNEE ARTHROSCOPY WITH MEDIAL MENISECTOMY Right 06/12/2017   Procedure: KNEE ARTHROSCOPY WITH PARTIAL MEDIAL MENISECTOMY;  Surgeon: [name], MD;  Location: [facility];  Service: Orthopedics;  Laterality: Right;  . MALONEY DILATION N/A 03/13/2020   Procedure: Venia Minks DILATION;  Surgeon: Daneil Dolin, MD;  [facility];  Service: Endoscopy;  Laterality: N/A;  . POLYPECTOMY  03/13/2020   Procedure: POLYPECTOMY;  Surgeon: Daneil Dolin, MD;  [facility];  Service: Endoscopy;;  . RESECTION OF ABDOMINAL MASS     adrenal mass right (cancer)  . TOTAL KNEE ARTHROPLASTY Right 07/13/2018   Procedure: RIGHT TOTAL KNEE ARTHROPLASTY,INJECTION OF LEFT KNEE;  Surgeon: Gaynelle Arabian, MD;  Location: [facility];  Service: Orthopedics;  Laterality: Right;  79min   Current Outpatient Medications on File Prior to Visit  Medication Sig Dispense Refill  . albuterol (PROVENTIL HFA;VENTOLIN HFA) 108 (90 Base) MCG/ACT inhaler Inhale 2 puffs into the lungs every 6 (  six) hours as needed for wheezing or shortness of breath. 1 Inhaler 0  . atorvastatin (LIPITOR) 10 MG tablet TAKE (1) TABLET BY MOUTH AT BEDTIME. (Patient taking differently: Take 10 mg by mouth at bedtime. TAKE (1) TABLET BY MOUTH AT BEDTIME.) 90 tablet 3  . benazepril (LOTENSIN) 40 MG tablet Take 1 tablet (40 mg total) by mouth daily. 90 tablet 3  . gabapentin (NEURONTIN) 300 MG capsule TAKE 2  CAPSULES BY MOUTH THREE TIMES A DAY. (Patient taking differently: Take 600 mg by mouth 3 (three) times daily. TAKE 2 CAPSULES BY MOUTH THREE TIMES A DAY.) 540 capsule 3  . hydrochlorothiazide (HYDRODIURIL) 25 MG tablet TAKE (1) TABLET BY MOUTH ONCE DAILY. (Patient taking differently: Take 25 mg by mouth daily. TAKE (1) TABLET BY MOUTH ONCE DAILY.) 90 tablet 3  . metFORMIN (GLUCOPHAGE) 1000 MG tablet TAKE (1) TABLET BY MOUTH TWICE A DAY WITH A MEAL. 180 tablet 0  . rOPINIRole (REQUIP) 1 MG tablet TAKE (2) TABLETS BY MOUTH AT BEDTIME. (Patient taking differently: Take 1 mg by mouth at bedtime. ) 180 tablet 3   No current facility-administered medications on file prior to visit.   No Known Allergies Social History   Socioeconomic History  . Marital status: Married    Spouse name: Not on file  . Number of children: Not on file  . Years of education: Not on file  . Highest education level: Not on file  Occupational History  . Not on file  Tobacco Use  . Smoking status: Current Every Day Smoker    Packs/day: 2.00    Years: 40.00    Pack years: 80.00    Types: Cigarettes  . Smokeless tobacco: Never Used  Vaping Use  . Vaping Use: Never used  Substance and Sexual Activity  . Alcohol use: Yes    Comment: once a month  . Drug use: No  . Sexual activity: Yes  Other Topics Concern  . Not on file  Social History Narrative  . Not on file   Social Determinants of Health   Financial Resource Strain:   . Difficulty of Paying Living Expenses: Not on file  Food Insecurity:   . Worried About Charity fundraiser in the Last Year: Not on file  . Ran Out of Food in the Last Year: Not on file  Transportation Needs:   . Lack of Transportation (Medical): Not on file  . Lack of Transportation (Non-Medical): Not on file  Physical Activity:   . Days of Exercise per Week: Not on file  . Minutes of Exercise per Session: Not on file  Stress:   . Feeling of Stress : Not on file  Social  Connections:   . Frequency of Communication with Friends and Family: Not on file  . Frequency of Social Gatherings with Friends and Family: Not on file  . Attends Religious Services: Not on file  . Active Member of Clubs or Organizations: Not on file  . Attends Archivist Meetings: Not on file  . Marital Status: Not on file  Intimate Partner Violence:   . Fear of Current or Ex-Partner: Not on file  . Emotionally Abused: Not on file  . Physically Abused: Not on file  . Sexually Abused: Not on file      Review of Systems  All other systems reviewed and are negative.      Objective:   Physical Exam Vitals reviewed.  Constitutional:      General: He is  not in acute distress.    Appearance: He is well-developed. He is not diaphoretic.  HENT:     Head: Normocephalic and atraumatic.     Right Ear: External ear normal.     Left Ear: External ear normal.     Nose: Nose normal.     Mouth/Throat:     Pharynx: No oropharyngeal exudate.  Eyes:     General: No scleral icterus.       Right eye: No discharge.        Left eye: No discharge.     Conjunctiva/sclera: Conjunctivae normal.     Pupils: Pupils are equal, round, and reactive to light.  Neck:     Thyroid: No thyromegaly.     Vascular: No JVD.     Trachea: No tracheal deviation.  Cardiovascular:     Rate and Rhythm: Normal rate and regular rhythm.     Heart sounds: Normal heart sounds. No murmur heard.  No friction rub. No gallop.   Pulmonary:     Effort: Pulmonary effort is normal. No respiratory distress.     Breath sounds: Normal breath sounds. No stridor. No wheezing or rales.  Chest:     Chest wall: No tenderness.  Abdominal:     General: Bowel sounds are normal. There is no distension.     Palpations: Abdomen is soft. There is no mass.     Tenderness: There is no abdominal tenderness. There is no guarding or rebound.  Musculoskeletal:        General: No tenderness or deformity. Normal range of motion.      Cervical back: Normal range of motion and neck supple.  Lymphadenopathy:     Cervical: No cervical adenopathy.  Skin:    General: Skin is warm.     Coloration: Skin is not pale.     Findings: No erythema or rash.  Neurological:     Mental Status: He is alert and oriented to person, place, and time.     Cranial Nerves: No cranial nerve deficit.     Motor: No abnormal muscle tone.     Coordination: Coordination normal.     Deep Tendon Reflexes: Reflexes are normal and symmetric.  Psychiatric:        Behavior: Behavior normal.        Thought Content: Thought content normal.        Judgment: Judgment normal.           Assessment & Plan:  Type 2 diabetes mellitus with diabetic neuropathy, without long-term current use of insulin (HCC) - Plan: Hemoglobin A1c, CBC with Differential/Platelet, COMPLETE METABOLIC PANEL WITH GFR, Lipid panel, Microalbumin, urine  Chronic kidney disease, unspecified CKD stage  Benign essential HTN  Pure hypercholesterolemia  Peripheral polyneuropathy  Blood pressure today is well controlled.  I will check a CBC, CMP, fasting lipid panel, urine microalbumin, and an A1c.  Goal A1c is less than 6.5.  I explained to the patient that I did not feel Metformin will damage his kidneys.  However if his creatinine is greater than 1.5 I would switch to an alternative to prevent lactic acidosis.  I will check a fasting lipid panel.  Ideally I like his LDL cholesterol to be less than 100.  Strongly recommended smoking cessation.  Monitor for worsening kidney disease.  Strongly recommended a flu shot.  Also strongly strongly strongly recommended Covid vaccination.  Patient is hesitant to receive the vaccine.

## 2020-04-08 LAB — COMPLETE METABOLIC PANEL WITH GFR
AG Ratio: 1.6 (calc) (ref 1.0–2.5)
ALT: 9 U/L (ref 9–46)
AST: 13 U/L (ref 10–35)
Albumin: 3.9 g/dL (ref 3.6–5.1)
Alkaline phosphatase (APISO): 72 U/L (ref 35–144)
BUN: 14 mg/dL (ref 7–25)
CO2: 27 mmol/L (ref 20–32)
Calcium: 9.1 mg/dL (ref 8.6–10.3)
Chloride: 100 mmol/L (ref 98–110)
Creat: 1.28 mg/dL (ref 0.70–1.33)
GFR, Est African American: 71 mL/min/{1.73_m2} (ref >=60)
GFR, Est Non African American: 61 mL/min/{1.73_m2} (ref >=60)
Globulin: 2.4 g/dL (calc) (ref 1.9–3.7)
Glucose, Bld: 93 mg/dL (ref 65–99)
Potassium: 4.5 mmol/L (ref 3.5–5.3)
Sodium: 138 mmol/L (ref 135–146)
Total Bilirubin: 1.1 mg/dL (ref 0.2–1.2)
Total Protein: 6.3 g/dL (ref 6.1–8.1)

## 2020-04-08 LAB — CBC WITH DIFFERENTIAL/PLATELET
Absolute Monocytes: 952 cells/uL — ABNORMAL HIGH (ref 200–950)
Basophils Absolute: 90 cells/uL (ref 0–200)
Basophils Relative: 0.8 %
Eosinophils Absolute: 1523 cells/uL — ABNORMAL HIGH (ref 15–500)
Eosinophils Relative: 13.6 %
HCT: 44.9 % (ref 38.5–50.0)
Hemoglobin: 15.3 g/dL (ref 13.2–17.1)
Lymphs Abs: 2856 cells/uL (ref 850–3900)
MCH: 30.8 pg (ref 27.0–33.0)
MCHC: 34.1 g/dL (ref 32.0–36.0)
MCV: 90.3 fL (ref 80.0–100.0)
MPV: 9.6 fL (ref 7.5–12.5)
Monocytes Relative: 8.5 %
Neutro Abs: 5779 cells/uL (ref 1500–7800)
Neutrophils Relative %: 51.6 %
Platelets: 358 10*3/uL (ref 140–400)
RBC: 4.97 10*6/uL (ref 4.20–5.80)
RDW: 12.7 % (ref 11.0–15.0)
Total Lymphocyte: 25.5 %
WBC: 11.2 10*3/uL — ABNORMAL HIGH (ref 3.8–10.8)

## 2020-04-08 LAB — HEMOGLOBIN A1C
Hgb A1c MFr Bld: 6.4 % of total Hgb — ABNORMAL HIGH (ref <5.7)
Mean Plasma Glucose: 137 (calc)
eAG (mmol/L): 7.6 (calc)

## 2020-04-08 LAB — LIPID PANEL
Cholesterol: 113 mg/dL (ref <200)
HDL: 31 mg/dL — ABNORMAL LOW (ref >=40)
LDL Cholesterol (Calc): 56 mg/dL (calc)
Non-HDL Cholesterol (Calc): 82 mg/dL (calc) (ref <130)
Total CHOL/HDL Ratio: 3.6 (calc) (ref <5.0)
Triglycerides: 179 mg/dL — ABNORMAL HIGH (ref <150)

## 2020-04-08 LAB — MICROALBUMIN, URINE: Microalb, Ur: 1.5 mg/dL

## 2020-06-08 ENCOUNTER — Other Ambulatory Visit: Payer: Self-pay | Admitting: Family Medicine

## 2020-06-14 ENCOUNTER — Ambulatory Visit: Payer: Managed Care, Other (non HMO) | Admitting: Gastroenterology

## 2020-06-21 ENCOUNTER — Other Ambulatory Visit: Payer: Self-pay | Admitting: Family Medicine

## 2020-07-05 ENCOUNTER — Telehealth: Payer: Self-pay

## 2020-07-05 NOTE — Telephone Encounter (Signed)
Patient wife called for copy of lab results

## 2020-07-19 ENCOUNTER — Other Ambulatory Visit: Payer: Self-pay | Admitting: Family Medicine

## 2020-09-07 ENCOUNTER — Other Ambulatory Visit: Payer: Self-pay | Admitting: Internal Medicine

## 2020-09-07 ENCOUNTER — Other Ambulatory Visit: Payer: Self-pay | Admitting: Family Medicine

## 2020-10-05 ENCOUNTER — Other Ambulatory Visit: Payer: Self-pay | Admitting: Family Medicine

## 2020-10-10 ENCOUNTER — Other Ambulatory Visit: Payer: Self-pay | Admitting: Family Medicine

## 2020-10-19 ENCOUNTER — Other Ambulatory Visit: Payer: Self-pay | Admitting: Family Medicine

## 2020-10-25 ENCOUNTER — Encounter: Payer: Self-pay | Admitting: Nurse Practitioner

## 2020-10-25 ENCOUNTER — Other Ambulatory Visit: Payer: Self-pay

## 2020-10-25 ENCOUNTER — Ambulatory Visit (INDEPENDENT_AMBULATORY_CARE_PROVIDER_SITE_OTHER): Payer: Managed Care, Other (non HMO) | Admitting: Nurse Practitioner

## 2020-10-25 VITALS — BP 136/84 | HR 106 | Temp 98.2°F | Ht 69.0 in | Wt 257.8 lb

## 2020-10-25 DIAGNOSIS — J301 Allergic rhinitis due to pollen: Secondary | ICD-10-CM

## 2020-10-25 DIAGNOSIS — J40 Bronchitis, not specified as acute or chronic: Secondary | ICD-10-CM | POA: Diagnosis not present

## 2020-10-25 MED ORDER — PREDNISONE 20 MG PO TABS: 40.0000 mg | ORAL_TABLET | Freq: Every day | ORAL | 0 refills | Status: DC

## 2020-10-25 MED ORDER — ALBUTEROL SULFATE HFA 108 (90 BASE) MCG/ACT IN AERS: 2.0000 | INHALATION_SPRAY | Freq: Four times a day (QID) | RESPIRATORY_TRACT | 0 refills | Status: DC | PRN

## 2020-10-25 MED ORDER — FLUTICASONE PROPIONATE 50 MCG/ACT NA SUSP: 2.0000 | Freq: Every day | NASAL | 6 refills | Status: AC

## 2020-10-25 MED ORDER — AZITHROMYCIN 250 MG PO TABS: ORAL_TABLET | ORAL | 0 refills | Status: DC

## 2020-10-25 MED ORDER — ALBUTEROL SULFATE (2.5 MG/3ML) 0.083% IN NEBU: 2.5000 mg | INHALATION_SOLUTION | Freq: Once | RESPIRATORY_TRACT | Status: DC

## 2020-10-25 MED ORDER — CETIRIZINE HCL 10 MG PO TABS: 10.0000 mg | ORAL_TABLET | Freq: Every day | ORAL | 3 refills | Status: DC

## 2020-10-25 MED ORDER — ALBUTEROL SULFATE (2.5 MG/3ML) 0.083% IN NEBU: 2.5000 mg | INHALATION_SOLUTION | Freq: Four times a day (QID) | RESPIRATORY_TRACT | 0 refills | Status: DC | PRN

## 2020-10-25 NOTE — Progress Notes (Signed)
Subjective:    Patient ID: Jason French, male    DOB: 1961/03/04, 60 y.o.   MRN: [id number]  HPI: Jason French is a 60 y.o. male presenting for congestion and allergies.  Chief Complaint  Patient presents with  . allergy    Stuffiness due to climate change, otc's are not working. Onset 2 days ago   ALLERGIES When he went to [place] this weekend, symptoms got worse. Duration: days , gets these every year Fever: yes; hot/cold chills Runny nose: yes clear, was yellow Nasal congestion: yes Nasal itching: yes Sneezing: yes Eye swelling, itching or discharge: yes Post nasal drip: yes Cough: yes; worse in the morning Sinus pressure: yes  Ear pain: yes  Ear pressure: yes Chest pain: no Hemoptysis: no Shortness of breath: at times, especially with coughing Symptoms occur seasonally: no Symptoms occur perenially: yes Satisfied with current treatment: nasal spray, Mucinex Allergist evaluation in past: no Allergen injection immunotherapy: no Recurrent sinus infections: no ENT evaluation in past: no Known environmental allergy: yes Indoor pets: yes History of asthma: no Current allergy medications: Vicks flonase  No Known Allergies  Outpatient Encounter Medications as of 10/25/2020  Medication Sig  . albuterol (PROVENTIL) (2.5 MG/3ML) 0.083% nebulizer solution Take 3 mLs (2.5 mg total) by nebulization every 6 (six) hours as needed for wheezing or shortness of breath. Use this medication OR rescue inhaler every 6 hours as needed.  Not both.  Marland Kitchen azithromycin (ZITHROMAX) 250 MG tablet Take 2 tablets (500 mg) day 1 and then 1 tablet (250 mg) days 2-5.  Marland Kitchen cetirizine (ZYRTEC) 10 MG tablet Take 1 tablet (10 mg total) by mouth daily. Take at night time to minimize drowsiness.  . fluticasone (FLONASE) 50 MCG/ACT nasal spray Place 2 sprays into both nostrils daily.  . predniSONE (DELTASONE) 20 MG tablet Take 2 tablets (40 mg total) by mouth daily with breakfast.  . albuterol  (VENTOLIN HFA) 108 (90 Base) MCG/ACT inhaler Inhale 2 puffs into the lungs every 6 (six) hours as needed for wheezing or shortness of breath.  Marland Kitchen atorvastatin (LIPITOR) 10 MG tablet TAKE (1) TABLET BY MOUTH AT BEDTIME.  . benazepril (LOTENSIN) 40 MG tablet TAKE 1 TABLET BY MOUTH ONCE DAILY.  . DULoxetine (CYMBALTA) 60 MG capsule TAKE ONE CAPSULE BY MOUTH ONCE DAILY.  Marland Kitchen gabapentin (NEURONTIN) 300 MG capsule Take 2 capsules (600 mg total) by mouth 3 (three) times daily. TAKE 2 CAPSULES BY MOUTH THREE TIMES A DAY.  . hydrochlorothiazide (HYDRODIURIL) 25 MG tablet TAKE (1) TABLET BY MOUTH ONCE DAILY.  . metFORMIN (GLUCOPHAGE) 1000 MG tablet TAKE (1) TABLET BY MOUTH TWICE A DAY WITH A MEAL.  . pantoprazole (PROTONIX) 40 MG tablet TAKE (1) TABLET BY MOUTH ONCE DAILY.  Marland Kitchen rOPINIRole (REQUIP) 1 MG tablet TAKE (2) TABLETS BY MOUTH AT BEDTIME.  . [DISCONTINUED] albuterol (PROVENTIL HFA;VENTOLIN HFA) 108 (90 Base) MCG/ACT inhaler Inhale 2 puffs into the lungs every 6 (six) hours as needed for wheezing or shortness of breath.   Facility-Administered Encounter Medications as of 10/25/2020  Medication  . albuterol (PROVENTIL) (2.5 MG/3ML) 0.083% nebulizer solution 2.5 mg    Patient Active Problem List   Diagnosis Date Noted  . Dysphagia 01/20/2020  . History of adenomatous polyp of colon 01/20/2020  . Constipation 01/20/2020  . Rectal bleeding 01/20/2020  . Osteoarthritis of right knee 07/14/2018  . Primary osteoarthritis of right knee 07/13/2018  . OA (osteoarthritis) of knee 07/13/2018  . S/P right knee arthroscopy 06/12/17 06/16/2017  .  Derangement of posterior horn of medial meniscus due to old tear or injury, right knee   . Primary osteoarthritis of left knee   . Prolonged pt (prothrombin time) 05/20/2017  . Abnormal bleeding time 05/05/2017  . HEMATOCHEZIA 03/09/2010  . DIVERTICULITIS, HX OF 03/09/2010  . HYPERLIPIDEMIA 02/28/2010  . HYPERTENSION 02/28/2010    Past Medical History:   Diagnosis Date  . Arthritis   . Cancer [facility])    adrenal cancer  . Diabetes mellitus without complication ([place])   . Diverticulitis 2006   s/p sigmoid colectomy, continues with scattered pancolinic diverticula  . Hyperlipidemia   . Hypertension   . Neuropathy   . RLS (restless legs syndrome)   . Sleep apnea    use CPAP    Relevant past medical, surgical, family and social history reviewed and updated as indicated. Interim medical history since our last visit reviewed.  Review of Systems Per HPI unless specifically indicated above     Objective:    BP 136/84   Pulse (!) 106   Temp 98.2 F (36.8 C)   Ht 5\' 9"  (1.753 m)   Wt 257 lb 12.8 oz (116.9 kg)   SpO2 96%   BMI 38.07 kg/m   Wt Readings from Last 3 Encounters:  10/25/20 257 lb 12.8 oz (116.9 kg)  04/07/20 255 lb (115.7 kg)  03/10/20 250 lb (113.4 kg)    Physical Exam Constitutional:      General: He is not in acute distress.    Appearance: He is obese. He is not toxic-appearing.  HENT:     Head: Normocephalic and atraumatic.     Jaw: Tenderness present.     Right Ear: Tympanic membrane, ear canal and external ear normal.     Left Ear: Tympanic membrane, ear canal and external ear normal.     Nose: Congestion present. No rhinorrhea.     Mouth/Throat:     Mouth: Mucous membranes are moist.     Pharynx: Posterior oropharyngeal erythema present. No oropharyngeal exudate.  Eyes:     General: No scleral icterus.    Extraocular Movements: Extraocular movements intact.     Pupils: Pupils are equal, round, and reactive to light.  Cardiovascular:     Rate and Rhythm: Normal rate and regular rhythm.  Pulmonary:     Effort: Pulmonary effort is normal.     Breath sounds: Normal breath sounds. Decreased air movement present.  Abdominal:     General: Abdomen is flat. Bowel sounds are normal. There is no distension.     Palpations: Abdomen is soft.     Tenderness: There is no right CVA tenderness or left CVA  tenderness.  Musculoskeletal:     Cervical back: Normal range of motion and neck supple. No tenderness.  Lymphadenopathy:     Cervical: No cervical adenopathy.  Skin:    General: Skin is warm and dry.     Capillary Refill: Capillary refill takes less than 2 seconds.     Coloration: Skin is not jaundiced or pale.     Findings: No erythema.  Neurological:     Mental Status: He is alert and oriented to person, place, and time.     Motor: No weakness.     Gait: Gait normal.  Psychiatric:        Mood and Affect: Mood normal.        Behavior: Behavior normal.        Thought Content: Thought content normal.  Judgment: Judgment normal.       Assessment & Plan:  1. Bronchitis Acute.  Question viral vs. Allergic etiology.  Also concern for underlying chronic bronchitis given 80 pack year smoking history.  Albuterol nebulizer treatment given in office with much improved air movement in lungs, however coarse breath sounds heard in lung bases bilaterally.  Will treat with albuterol inhaler/nebulizer q4 hours prn at home, oral steroids, antimicrobial coverage for chronic bronchitis exacerbation.  Also okay to start daily antihistamine with intranasal corticosteroid.  Follow up Friday/early next week to reassess breathing.   - albuterol (VENTOLIN HFA) 108 (90 Base) MCG/ACT inhaler; Inhale 2 puffs into the lungs every 6 (six) hours as needed for wheezing or shortness of breath.  Dispense: 1 each; Refill: 0 - predniSONE (DELTASONE) 20 MG tablet; Take 2 tablets (40 mg total) by mouth daily with breakfast.  Dispense: 10 tablet; Refill: 0 - azithromycin (ZITHROMAX) 250 MG tablet; Take 2 tablets (500 mg) day 1 and then 1 tablet (250 mg) days 2-5.  Dispense: 6 tablet; Refill: 0 - albuterol (PROVENTIL) (2.5 MG/3ML) 0.083% nebulizer solution 2.5 mg  2. Non-seasonal allergic rhinitis due to pollen Acute.  Start daily antihistamine with daily intranasal corticosteroid.  Follow up late this week/early  next week to be sure symptoms are improving.  - cetirizine (ZYRTEC) 10 MG tablet; Take 1 tablet (10 mg total) by mouth daily. Take at night time to minimize drowsiness.  Dispense: 30 tablet; Refill: 3 - fluticasone (FLONASE) 50 MCG/ACT nasal spray; Place 2 sprays into both nostrils daily.  Dispense: 16 g; Refill: 6   Follow up plan: Return in about 3 days (around 10/28/2020) for re-assess breathing.

## 2020-10-27 ENCOUNTER — Ambulatory Visit (INDEPENDENT_AMBULATORY_CARE_PROVIDER_SITE_OTHER): Payer: Managed Care, Other (non HMO) | Admitting: Nurse Practitioner

## 2020-10-27 ENCOUNTER — Encounter: Payer: Self-pay | Admitting: Nurse Practitioner

## 2020-10-27 ENCOUNTER — Other Ambulatory Visit: Payer: Self-pay

## 2020-10-27 VITALS — BP 138/82 | HR 93 | Temp 98.1°F | Ht 69.0 in | Wt 259.2 lb

## 2020-10-27 DIAGNOSIS — J40 Bronchitis, not specified as acute or chronic: Secondary | ICD-10-CM | POA: Diagnosis not present

## 2020-10-27 HISTORY — DX: Bronchitis, not specified as acute or chronic: J40

## 2020-10-27 NOTE — Patient Instructions (Signed)
Follow up in about 1 week for breathing.

## 2020-10-27 NOTE — Assessment & Plan Note (Signed)
We discussed his smoking history at length today.  Patient is not ready to quit smoking at this time and has smoked about 2 packs/day for at least 20 years.  I did encourage him to quit smoking.  We discussed getting him set up to be screened for lung cancer yearly with a low-dose CAT scan, however patient desires to think about this and not referred with it today.  Given his breathing is feeling better, we will continue with the prednisone and antimicrobial therapy.  Also continue daily antihistamine and intranasal steroid for now.  Encouraged patient to always keep albuterol inhaler close by for coughing attacks-he should use this when this happens.  I explained that he is likely experiencing a lack of oxygen to his peripheral tissues which is why he experiences tingling.  He likely has underlying COPD and will most likely need to start a maintenance inhaler, however I want to see him back next week to make sure his breathing is continuing to improve after finishing the course of steroids.  With any sudden onset of chest pain or shortness of breath, he should seek emergent care.

## 2020-10-27 NOTE — Progress Notes (Signed)
Subjective:    Patient ID: Jason French, male    DOB: 1961/07/09, 60 y.o.   MRN: [id number]  HPI: Jason French is a 60 y.o. male presenting with wife for shortness of breath.  Chief Complaint  Patient presents with  . Follow-up    Breathing has improved   SHORTNESS OF BREATH Patient reports he thinks his breathing is doing better.  He is taking the azithromycin and prednisone as prescribed.  He has used the albuterol about 3 times since it was prescribed 2 days ago.  He has also started taking the Zyrtec and Flonase daily. Duration: years Onset: gradual Severity: moderate Episode duration:  Frequency: multiple times per day Related to exertion: yes Cough: yes; productive Chest tightness: no Wheezing: no Fevers: no Chest pain: no Palpitations: no  Nausea: no Diaphoresis: no Deconditioning: no Status: better Aggravating factors: activity Alleviating factors: albuterol Treatments attempted: albuterol  Patient reports he started coughing last night and fell.  It was an unwitnessed fall.  Did not black out, was not incontinent of urine or stool.  When he coughs hard, he feels a tingling through his body and feels like he cannot catch his breath.  No Known Allergies  Outpatient Encounter Medications as of 10/27/2020  Medication Sig  . albuterol (PROVENTIL) (2.5 MG/3ML) 0.083% nebulizer solution Take 3 mLs (2.5 mg total) by nebulization every 6 (six) hours as needed for wheezing or shortness of breath. Use this medication OR rescue inhaler every 6 hours as needed.  Not both.  . albuterol (VENTOLIN HFA) 108 (90 Base) MCG/ACT inhaler Inhale 2 puffs into the lungs every 6 (six) hours as needed for wheezing or shortness of breath.  Marland Kitchen atorvastatin (LIPITOR) 10 MG tablet TAKE (1) TABLET BY MOUTH AT BEDTIME.  Marland Kitchen azithromycin (ZITHROMAX) 250 MG tablet Take 2 tablets (500 mg) day 1 and then 1 tablet (250 mg) days 2-5.  . benazepril (LOTENSIN) 40 MG tablet TAKE 1 TABLET BY MOUTH ONCE  DAILY.  . cetirizine (ZYRTEC) 10 MG tablet Take 1 tablet (10 mg total) by mouth daily. Take at night time to minimize drowsiness.  . DULoxetine (CYMBALTA) 60 MG capsule TAKE ONE CAPSULE BY MOUTH ONCE DAILY.  . fluticasone (FLONASE) 50 MCG/ACT nasal spray Place 2 sprays into both nostrils daily.  Marland Kitchen gabapentin (NEURONTIN) 300 MG capsule Take 2 capsules (600 mg total) by mouth 3 (three) times daily. TAKE 2 CAPSULES BY MOUTH THREE TIMES A DAY.  . hydrochlorothiazide (HYDRODIURIL) 25 MG tablet TAKE (1) TABLET BY MOUTH ONCE DAILY.  . metFORMIN (GLUCOPHAGE) 1000 MG tablet TAKE (1) TABLET BY MOUTH TWICE A DAY WITH A MEAL.  . pantoprazole (PROTONIX) 40 MG tablet TAKE (1) TABLET BY MOUTH ONCE DAILY.  Marland Kitchen predniSONE (DELTASONE) 20 MG tablet Take 2 tablets (40 mg total) by mouth daily with breakfast.  . rOPINIRole (REQUIP) 1 MG tablet TAKE (2) TABLETS BY MOUTH AT BEDTIME.   Facility-Administered Encounter Medications as of 10/27/2020  Medication  . albuterol (PROVENTIL) (2.5 MG/3ML) 0.083% nebulizer solution 2.5 mg    Patient Active Problem List   Diagnosis Date Noted  . Bronchitis 10/27/2020  . Dysphagia 01/20/2020  . History of adenomatous polyp of colon 01/20/2020  . Constipation 01/20/2020  . Rectal bleeding 01/20/2020  . Osteoarthritis of right knee 07/14/2018  . Primary osteoarthritis of right knee 07/13/2018  . OA (osteoarthritis) of knee 07/13/2018  . S/P right knee arthroscopy 06/12/17 06/16/2017  . Derangement of posterior horn of medial meniscus due to  old tear or injury, right knee   . Primary osteoarthritis of left knee   . Prolonged pt (prothrombin time) 05/20/2017  . Abnormal bleeding time 05/05/2017  . HEMATOCHEZIA 03/09/2010  . DIVERTICULITIS, HX OF 03/09/2010  . HYPERLIPIDEMIA 02/28/2010  . HYPERTENSION 02/28/2010    Past Medical History:  Diagnosis Date  . Arthritis   . Cancer [facility])    adrenal cancer  . Diabetes mellitus without complication ([place])   . Diverticulitis 2006    s/p sigmoid colectomy, continues with scattered pancolinic diverticula  . Hyperlipidemia   . Hypertension   . Neuropathy   . RLS (restless legs syndrome)   . Sleep apnea    use CPAP    Relevant past medical, surgical, family and social history reviewed and updated as indicated. Interim medical history since our last visit reviewed.  Review of Systems Per HPI unless specifically indicated above     Objective:    BP 138/82   Pulse 93   Temp 98.1 F (36.7 C)   Ht 5\' 9"  (1.753 m)   Wt 259 lb 3.2 oz (117.6 kg)   SpO2 93%   BMI 38.28 kg/m   Wt Readings from Last 3 Encounters:  10/27/20 259 lb 3.2 oz (117.6 kg)  10/25/20 257 lb 12.8 oz (116.9 kg)  04/07/20 255 lb (115.7 kg)    Physical Exam Constitutional:      General: He is not in acute distress.    Appearance: He is obese. He is not toxic-appearing.  HENT:     Head: Atraumatic.     Jaw: Tenderness present.     Mouth/Throat:     Pharynx: No posterior oropharyngeal erythema.  Cardiovascular:     Rate and Rhythm: Normal rate and regular rhythm.  Pulmonary:     Effort: Pulmonary effort is normal. No respiratory distress.     Breath sounds: Normal breath sounds. No decreased air movement. No wheezing, rhonchi or rales.  Abdominal:     General: Abdomen is flat. Bowel sounds are normal. There is no distension.     Palpations: Abdomen is soft.  Musculoskeletal:        General: Normal range of motion.     Right lower leg: No edema.     Left lower leg: No edema.  Skin:    General: Skin is warm and dry.     Capillary Refill: Capillary refill takes less than 2 seconds.     Coloration: Skin is not jaundiced or pale.     Findings: No erythema.  Neurological:     Mental Status: He is alert and oriented to person, place, and time.     Motor: No weakness.     Gait: Gait normal.  Psychiatric:        Mood and Affect: Mood normal.        Behavior: Behavior normal.        Thought Content: Thought content normal.         Judgment: Judgment normal.       Assessment & Plan:   Problem List Items Addressed This Visit      Respiratory   Bronchitis - Primary    We discussed his smoking history at length today.  Patient is not ready to quit smoking at this time and has smoked about 2 packs/day for at least 20 years.  I did encourage him to quit smoking.  We discussed getting him set up to be screened for lung cancer yearly with a low-dose CAT scan,  however patient desires to think about this and not referred with it today.  Given his breathing is feeling better, we will continue with the prednisone and antimicrobial therapy.  Also continue daily antihistamine and intranasal steroid for now.  Encouraged patient to always keep albuterol inhaler close by for coughing attacks-he should use this when this happens.  I explained that he is likely experiencing a lack of oxygen to his peripheral tissues which is why he experiences tingling.  He likely has underlying COPD and will most likely need to start a maintenance inhaler, however I want to see him back next week to make sure his breathing is continuing to improve after finishing the course of steroids.  With any sudden onset of chest pain or shortness of breath, he should seek emergent care.          Follow up plan: Return in about 1 week (around 11/03/2020) for breathing.

## 2020-11-02 ENCOUNTER — Ambulatory Visit: Payer: Managed Care, Other (non HMO) | Admitting: Nurse Practitioner

## 2020-11-06 ENCOUNTER — Ambulatory Visit (INDEPENDENT_AMBULATORY_CARE_PROVIDER_SITE_OTHER): Payer: Managed Care, Other (non HMO) | Admitting: Nurse Practitioner

## 2020-11-06 ENCOUNTER — Ambulatory Visit (HOSPITAL_COMMUNITY)
Admission: RE | Admit: 2020-11-06 | Discharge: 2020-11-06 | Disposition: A | Discharge status: Home / Self Care | Payer: Managed Care, Other (non HMO) | Source: Ambulatory Visit | Attending: Nurse Practitioner | Admitting: Nurse Practitioner

## 2020-11-06 ENCOUNTER — Other Ambulatory Visit: Payer: Self-pay

## 2020-11-06 VITALS — BP 138/92 | HR 100 | Temp 99.0°F | Ht 69.0 in | Wt 260.6 lb

## 2020-11-06 DIAGNOSIS — J418 Mixed simple and mucopurulent chronic bronchitis: Secondary | ICD-10-CM | POA: Insufficient documentation

## 2020-11-06 DIAGNOSIS — F17200 Nicotine dependence, unspecified, uncomplicated: Secondary | ICD-10-CM

## 2020-11-06 MED ORDER — SPIRIVA RESPIMAT 2.5 MCG/ACT IN AERS: 2.0000 | INHALATION_SPRAY | Freq: Every day | RESPIRATORY_TRACT | 3 refills | Status: DC

## 2020-11-06 NOTE — Patient Instructions (Signed)
Start Spiriva 2 puffs daily.  Rinse and spit after each use.   Follow up in 1 month. Referral for lung cancer screening placed.   Lung Cancer Screening A lung cancer screening is a test that checks for lung cancer. Lung cancer screening is done to look for lung cancer in its very early stages when you are not likely to have any symptoms and before it spreads beyond the lung, making it harder to treat. Finding cancer early improves the chances of successful treatment. It may save your life. Who should have screening? You should be screened for lung cancer if all of these apply:  You currently smoke or you have quit smoking within the past 15 years.  You are 36-16 years old. Screening may be recommended up to age 65 depending on your overall health and other factors.  You are in good general health.  You have a smoking history of 1 pack of cigarettes a day for 20 years or 2 packs a day for 10 years. Screening may also be recommended if you are at high risk for the disease. You may be at high risk if:  You have a family history of lung cancer.  You have been exposed to asbestos or radon.  You have chronic obstructive pulmonary disease (COPD). How is screening done? The recommended screening test is a low-dose computed tomography (LDCT) scan. This scan takes detailed images of the lungs. This allows a health care provider to look for abnormal cells. If you are at risk for lung cancer, it is recommended that you get screened once a year. Talk to your health care provider about the risks, benefits, and limitations of screening.   What are the benefits of screening? Screening can find lung cancer early, before symptoms start and before it has spread outside of the lungs. The chances of curing lung cancer are greater if the cancer is diagnosed early. What are the risks of screening?  The screening may show lung cancer when no cancer is present (false-positive result).  The screening may not  find lung cancer when it is present.  The person gets exposed to radiation. How can I lower my risk of lung cancer? Make these lifestyle changes to lower your risk of developing lung cancer:  Do not use any products that contain nicotine or tobacco, such as cigarettes, e-cigarettes, and chewing tobacco. If you need help quitting, ask your health care provider.  Avoid secondhand smoke.  Avoid exposure to radiation.  Avoid exposure to radon gas. Have your home checked for radon regularly.  Avoid things that cause cancer (carcinogens).  Avoid living or working in places with high air pollution. Questions to ask your health care provider  Am I eligible for lung cancer screening?  Does my health insurance cover the cost of lung cancer screening?  What happens if the lung cancer screening shows something of concern?  How soon will I have results from my lung cancer screening?  Is there anything that I need to do to prepare for my lung cancer screening?  What happens if I decide not to have lung cancer screening? Where to find more information Ask your health care provider about the risks and benefits of screening. More information and resources are available from these organizations:  [place] (ACS): www.cancer.org  American Lung Association: www.lung.org Contact a health care provider if:  You start to show symptoms of lung cancer, including: ? Coughing that will not go away. ? Making whistling sounds  when you breathe (wheezing). ? Chest pain. ? Coughing up blood. ? Shortness of breath. ? Weight loss that cannot be explained. ? Constant tiredness (fatigue). ? Hoarse voice. Summary  Lung cancer screening may find lung cancer before symptoms appear. Finding cancer early improves the chances of successful treatment. It may save your life.  The recommended screening test is a low-dose computed tomography (LDCT) scan that looks for abnormal cells in the lungs.  If you are at risk for lung cancer, it is recommended that you get screened once a year.  You can make lifestyle changes to lower your risk of lung cancer.  Ask your health care provider about the risks and benefits of screening. This information is not intended to replace advice given to you by your health care provider. Make sure you discuss any questions you have with your health care provider. Document Revised: 12/06/2019 Document Reviewed: 07/13/2019 Elsevier Patient Education  2021 [employer].

## 2020-11-06 NOTE — Progress Notes (Signed)
Subjective:    Patient ID: Jason French, male    DOB: 01/11/1961, 60 y.o.   MRN: [id number]  HPI: Jason French is a 60 y.o. male presenting for follow up of breathing.  Chief Complaint  Patient presents with  . Follow-up    breathing is continuing to improve after finishing the course of steroids.   BREATHING Breathing status: better Satisfied with current treatment?: yes Oxygen use: no Dyspnea frequency: twice daily   Cough frequency:  Twice daily  Rescue inhaler frequency:  Twice daily Limitation of activity: yes Productive cough: yes; all throughout the day Last Spirometry: Today Pneumovax: Up to Date Influenza: Not up to Date   No Known Allergies  Outpatient Encounter Medications as of 11/06/2020  Medication Sig  . albuterol (PROVENTIL) (2.5 MG/3ML) 0.083% nebulizer solution Take 3 mLs (2.5 mg total) by nebulization every 6 (six) hours as needed for wheezing or shortness of breath. Use this medication OR rescue inhaler every 6 hours as needed.  Not both.  . albuterol (VENTOLIN HFA) 108 (90 Base) MCG/ACT inhaler Inhale 2 puffs into the lungs every 6 (six) hours as needed for wheezing or shortness of breath.  Marland Kitchen atorvastatin (LIPITOR) 10 MG tablet TAKE (1) TABLET BY MOUTH AT BEDTIME.  . benazepril (LOTENSIN) 40 MG tablet TAKE 1 TABLET BY MOUTH ONCE DAILY.  . cetirizine (ZYRTEC) 10 MG tablet Take 1 tablet (10 mg total) by mouth daily. Take at night time to minimize drowsiness.  . DULoxetine (CYMBALTA) 60 MG capsule TAKE ONE CAPSULE BY MOUTH ONCE DAILY.  . fluticasone (FLONASE) 50 MCG/ACT nasal spray Place 2 sprays into both nostrils daily.  Marland Kitchen gabapentin (NEURONTIN) 300 MG capsule Take 2 capsules (600 mg total) by mouth 3 (three) times daily. TAKE 2 CAPSULES BY MOUTH THREE TIMES A DAY.  . hydrochlorothiazide (HYDRODIURIL) 25 MG tablet TAKE (1) TABLET BY MOUTH ONCE DAILY.  . metFORMIN (GLUCOPHAGE) 1000 MG tablet TAKE (1) TABLET BY MOUTH TWICE A DAY WITH A MEAL.  .  pantoprazole (PROTONIX) 40 MG tablet TAKE (1) TABLET BY MOUTH ONCE DAILY.  Marland Kitchen rOPINIRole (REQUIP) 1 MG tablet TAKE (2) TABLETS BY MOUTH AT BEDTIME.  . Tiotropium Bromide Monohydrate (SPIRIVA RESPIMAT) 2.5 MCG/ACT AERS Inhale 2 puffs into the lungs daily.  . [DISCONTINUED] azithromycin (ZITHROMAX) 250 MG tablet Take 2 tablets (500 mg) day 1 and then 1 tablet (250 mg) days 2-5.  . [DISCONTINUED] predniSONE (DELTASONE) 20 MG tablet Take 2 tablets (40 mg total) by mouth daily with breakfast.   Facility-Administered Encounter Medications as of 11/06/2020  Medication  . albuterol (PROVENTIL) (2.5 MG/3ML) 0.083% nebulizer solution 2.5 mg    Patient Active Problem List   Diagnosis Date Noted  . Mixed simple and mucopurulent chronic bronchitis ([place]) 11/07/2020  . Dysphagia 01/20/2020  . History of adenomatous polyp of colon 01/20/2020  . Constipation 01/20/2020  . Rectal bleeding 01/20/2020  . Osteoarthritis of right knee 07/14/2018  . Primary osteoarthritis of right knee 07/13/2018  . OA (osteoarthritis) of knee 07/13/2018  . S/P right knee arthroscopy 06/12/17 06/16/2017  . Derangement of posterior horn of medial meniscus due to old tear or injury, right knee   . Primary osteoarthritis of left knee   . Prolonged pt (prothrombin time) 05/20/2017  . Abnormal bleeding time 05/05/2017  . HEMATOCHEZIA 03/09/2010  . DIVERTICULITIS, HX OF 03/09/2010  . HYPERLIPIDEMIA 02/28/2010  . HYPERTENSION 02/28/2010    Past Medical History:  Diagnosis Date  . Arthritis   . Bronchitis  10/27/2020  . Cancer [facility])    adrenal cancer  . Diabetes mellitus without complication ([place])   . Diverticulitis 2006   s/p sigmoid colectomy, continues with scattered pancolinic diverticula  . Hyperlipidemia   . Hypertension   . Neuropathy   . RLS (restless legs syndrome)   . Sleep apnea    use CPAP    Relevant past medical, surgical, family and social history reviewed and updated as indicated. Interim medical history  since our last visit reviewed.  Review of Systems  Constitutional: Positive for chills and diaphoresis. Negative for activity change, appetite change, fatigue, fever and unexpected weight change.  HENT: Negative.  Negative for congestion, ear pain, postnasal drip, rhinorrhea, sinus pressure and sneezing.   Respiratory: Positive for cough, shortness of breath and wheezing. Negative for chest tightness.   Cardiovascular: Negative.  Negative for chest pain and palpitations.  Skin: Negative.   Neurological: Negative.    Per HPI unless specifically indicated above     Objective:    BP (!) 138/92   Pulse 100   Temp 99 F (37.2 C)   Ht 5\' 9"  (1.753 m)   Wt 260 lb 9.6 oz (118.2 kg)   SpO2 95%   BMI 38.48 kg/m   Wt Readings from Last 3 Encounters:  11/06/20 260 lb 9.6 oz (118.2 kg)  10/27/20 259 lb 3.2 oz (117.6 kg)  10/25/20 257 lb 12.8 oz (116.9 kg)    Physical Exam Vitals and nursing note reviewed.  Constitutional:      Appearance: Normal appearance. He is obese.  HENT:     Head: Normocephalic and atraumatic.     Right Ear: External ear normal.     Left Ear: External ear normal.     Nose: Nose normal. No congestion.     Mouth/Throat:     Mouth: Mucous membranes are moist.     Pharynx: Oropharynx is clear.  Eyes:     General: No scleral icterus.    Extraocular Movements: Extraocular movements intact.  Cardiovascular:     Rate and Rhythm: Regular rhythm.     Heart sounds: Normal heart sounds. No murmur heard.   Pulmonary:     Effort: Pulmonary effort is normal. No respiratory distress.     Breath sounds: Rhonchi present. No wheezing or rales.  Abdominal:     General: Abdomen is flat. There is no distension.     Palpations: Abdomen is soft.     Tenderness: There is no right CVA tenderness or left CVA tenderness.  Musculoskeletal:     Cervical back: Normal range of motion.  Lymphadenopathy:     Cervical: No cervical adenopathy.  Skin:    General: Skin is warm and  dry.     Capillary Refill: Capillary refill takes less than 2 seconds.     Coloration: Skin is not jaundiced or pale.     Findings: No erythema.  Neurological:     Mental Status: He is alert and oriented to person, place, and time.  Psychiatric:        Mood and Affect: Mood normal.        Behavior: Behavior normal.        Thought Content: Thought content normal.        Judgment: Judgment normal.       Assessment & Plan:   Problem List Items Addressed This Visit      Respiratory   Mixed simple and mucopurulent chronic bronchitis ([place]) - Primary    Likely chronic.  Spirometry today suggestive of obstructive disease.  This was discussed with the patient.  I encouraged him to quit smoking although he is in the pre-contemplation phase.  Will start on daily LAMA inhaler, continue rescue inhaler/nebulizer as needed.  Will also get chest x-ray today given rhonchus breath sounds in lung bases.  Patient is agreeable to lung cancer screening program with yearly low dose CT scans.  Will place referral. Follow up in 4 weeks.      Relevant Medications   Tiotropium Bromide Monohydrate (SPIRIVA RESPIMAT) 2.5 MCG/ACT AERS   Other Relevant Orders   DG Chest 2 View   PR BREATHING CAPACITY TEST    Other Visit Diagnoses    Current every day smoker       Relevant Orders   Ambulatory Referral for Lung Cancer Scre   PR BREATHING CAPACITY TEST       Follow up plan: Return in about 4 weeks (around 12/04/2020) for breathing f/u.

## 2020-11-07 ENCOUNTER — Encounter: Payer: Self-pay | Admitting: Nurse Practitioner

## 2020-11-07 ENCOUNTER — Telehealth: Payer: Self-pay | Admitting: *Deleted

## 2020-11-07 ENCOUNTER — Encounter: Payer: Self-pay | Admitting: *Deleted

## 2020-11-07 DIAGNOSIS — J418 Mixed simple and mucopurulent chronic bronchitis: Secondary | ICD-10-CM | POA: Insufficient documentation

## 2020-11-07 DIAGNOSIS — F17200 Nicotine dependence, unspecified, uncomplicated: Secondary | ICD-10-CM

## 2020-11-07 DIAGNOSIS — Z87891 Personal history of nicotine dependence: Secondary | ICD-10-CM

## 2020-11-07 DIAGNOSIS — Z122 Encounter for screening for malignant neoplasm of respiratory organs: Secondary | ICD-10-CM

## 2020-11-07 NOTE — Assessment & Plan Note (Addendum)
Likely chronic.  Spirometry today suggestive of obstructive disease.  This was discussed with the patient.  I encouraged him to quit smoking although he is in the pre-contemplation phase.  Will start on daily LAMA inhaler, continue rescue inhaler/nebulizer as needed.  Will also get chest x-ray today given rhonchus breath sounds in lung bases.  Patient is agreeable to lung cancer screening program with yearly low dose CT scans.  Will place referral. Follow up in 4 weeks.

## 2020-11-07 NOTE — Telephone Encounter (Addendum)
Received referral for initial lung cancer screening scan. Contacted patient and obtained smoking history,(current some day smoker, 2 ppd x 40 yrs)) as well as answering questions related to screening process. Patient denies signs of lung cancer such as weight loss or hemoptysis. Patient denies comorbidity that would prevent curative treatment if lung cancer were found. Patient is scheduled for shared decision making visit and CT scan on 11/27/20 @ 1:45 pm.   Patient cancelled his LCS appt. He reports he had a CT angiogram two weeks ago and his PCP recommended he not have his screening scan at this time. Patient is made aware of the difference in a CT angio and LCS CT scan. Patient is agreeable to being contacted next year in follow up to schedule a LCS scan at that time.

## 2020-11-08 ENCOUNTER — Telehealth: Payer: Self-pay | Admitting: *Deleted

## 2020-11-08 DIAGNOSIS — J418 Mixed simple and mucopurulent chronic bronchitis: Secondary | ICD-10-CM

## 2020-11-08 NOTE — Telephone Encounter (Signed)
Received fax requesting alternative to Spiriva.  Patient co-pay $492.

## 2020-11-09 ENCOUNTER — Other Ambulatory Visit: Payer: Self-pay

## 2020-11-09 MED ORDER — UMECLIDINIUM-VILANTEROL 62.5-25 MCG/INH IN AEPB: 1.0000 | INHALATION_SPRAY | Freq: Every day | RESPIRATORY_TRACT | Status: DC

## 2020-11-09 NOTE — Telephone Encounter (Signed)
Please cancel Spiriva.  Instead send in: Anoro Ellipta 62.5 mcg/25 mcg one inhalation once daily

## 2020-11-13 MED ORDER — UMECLIDINIUM-VILANTEROL 62.5-25 MCG/INH IN AEPB: 1.0000 | INHALATION_SPRAY | Freq: Every day | RESPIRATORY_TRACT | Status: DC

## 2020-11-13 NOTE — Addendum Note (Signed)
Addended by: Amalia Hailey on: 11/13/2020 08:47 AM   Modules accepted: Orders

## 2020-11-15 ENCOUNTER — Telehealth: Payer: Self-pay | Admitting: Family Medicine

## 2020-11-15 ENCOUNTER — Other Ambulatory Visit: Payer: Self-pay

## 2020-11-15 ENCOUNTER — Emergency Department (HOSPITAL_COMMUNITY): Payer: Managed Care, Other (non HMO)

## 2020-11-15 ENCOUNTER — Encounter (HOSPITAL_COMMUNITY): Payer: Self-pay

## 2020-11-15 ENCOUNTER — Emergency Department (HOSPITAL_COMMUNITY)
Admission: EM | Admit: 2020-11-15 | Discharge: 2020-11-15 | Disposition: A | Discharge status: Home / Self Care | Payer: Managed Care, Other (non HMO) | Attending: Emergency Medicine | Admitting: Emergency Medicine

## 2020-11-15 DIAGNOSIS — Z20822 Contact with and (suspected) exposure to covid-19: Secondary | ICD-10-CM | POA: Insufficient documentation

## 2020-11-15 DIAGNOSIS — Z79899 Other long term (current) drug therapy: Secondary | ICD-10-CM | POA: Insufficient documentation

## 2020-11-15 DIAGNOSIS — H538 Other visual disturbances: Secondary | ICD-10-CM | POA: Diagnosis not present

## 2020-11-15 DIAGNOSIS — F17210 Nicotine dependence, cigarettes, uncomplicated: Secondary | ICD-10-CM | POA: Diagnosis not present

## 2020-11-15 DIAGNOSIS — E1140 Type 2 diabetes mellitus with diabetic neuropathy, unspecified: Secondary | ICD-10-CM | POA: Diagnosis not present

## 2020-11-15 DIAGNOSIS — G629 Polyneuropathy, unspecified: Secondary | ICD-10-CM

## 2020-11-15 DIAGNOSIS — Z85858 Personal history of malignant neoplasm of other endocrine glands: Secondary | ICD-10-CM | POA: Diagnosis not present

## 2020-11-15 DIAGNOSIS — Z96651 Presence of right artificial knee joint: Secondary | ICD-10-CM | POA: Diagnosis not present

## 2020-11-15 DIAGNOSIS — I10 Essential (primary) hypertension: Secondary | ICD-10-CM | POA: Insufficient documentation

## 2020-11-15 LAB — CBC WITH DIFFERENTIAL/PLATELET
Abs Immature Granulocytes: 0.07 10*3/uL (ref 0.00–0.07)
Basophils Absolute: 0.1 10*3/uL (ref 0.0–0.1)
Basophils Relative: 1 %
Eosinophils Absolute: 1.3 10*3/uL — ABNORMAL HIGH (ref 0.0–0.5)
Eosinophils Relative: 9 %
HCT: 44.6 % (ref 39.0–52.0)
Hemoglobin: 15.1 g/dL (ref 13.0–17.0)
Immature Granulocytes: 1 %
Lymphocytes Relative: 28 %
Lymphs Abs: 3.8 10*3/uL (ref 0.7–4.0)
MCH: 30.9 pg (ref 26.0–34.0)
MCHC: 33.9 g/dL (ref 30.0–36.0)
MCV: 91.4 fL (ref 80.0–100.0)
Monocytes Absolute: 1.2 10*3/uL — ABNORMAL HIGH (ref 0.1–1.0)
Monocytes Relative: 9 %
Neutro Abs: 7 10*3/uL (ref 1.7–7.7)
Neutrophils Relative %: 52 %
Platelets: 383 10*3/uL (ref 150–400)
RBC: 4.88 MIL/uL (ref 4.22–5.81)
RDW: 13.3 % (ref 11.5–15.5)
WBC: 13.4 10*3/uL — ABNORMAL HIGH (ref 4.0–10.5)
nRBC: 0 % (ref 0.0–0.2)

## 2020-11-15 LAB — COMPREHENSIVE METABOLIC PANEL
ALT: 14 U/L (ref 0–44)
AST: 17 U/L (ref 15–41)
Albumin: 3.9 g/dL (ref 3.5–5.0)
Alkaline Phosphatase: 90 U/L (ref 38–126)
Anion gap: 12 (ref 5–15)
BUN: 14 mg/dL (ref 6–20)
CO2: 25 mmol/L (ref 22–32)
Calcium: 9.2 mg/dL (ref 8.9–10.3)
Chloride: 100 mmol/L (ref 98–111)
Creatinine, Ser: 1.47 mg/dL — ABNORMAL HIGH (ref 0.61–1.24)
GFR, Estimated: 54 mL/min — ABNORMAL LOW (ref >60)
Glucose, Bld: 116 mg/dL — ABNORMAL HIGH (ref 70–99)
Potassium: 3.9 mmol/L (ref 3.5–5.1)
Sodium: 137 mmol/L (ref 135–145)
Total Bilirubin: 0.7 mg/dL (ref 0.3–1.2)
Total Protein: 7 g/dL (ref 6.5–8.1)

## 2020-11-15 LAB — RESP PANEL BY RT-PCR (FLU A&B, COVID) ARPGX2
Influenza A by PCR: NEGATIVE
Influenza B by PCR: NEGATIVE
SARS Coronavirus 2 by RT PCR: NEGATIVE

## 2020-11-15 LAB — BRAIN NATRIURETIC PEPTIDE: B Natriuretic Peptide: 897.6 pg/mL — ABNORMAL HIGH (ref 0.0–100.0)

## 2020-11-15 LAB — TROPONIN I (HIGH SENSITIVITY)
Troponin I (High Sensitivity): 8 ng/L (ref <18)
Troponin I (High Sensitivity): 8 ng/L (ref <18)

## 2020-11-15 LAB — D-DIMER, QUANTITATIVE: D-Dimer, Quant: 0.52 ug/mL-FEU — ABNORMAL HIGH (ref 0.00–0.50)

## 2020-11-15 MED ORDER — GABAPENTIN 300 MG PO CAPS: 300.0000 mg | ORAL_CAPSULE | Freq: Three times a day (TID) | ORAL | 3 refills | Status: DC

## 2020-11-15 MED ORDER — UMECLIDINIUM-VILANTEROL 62.5-25 MCG/INH IN AEPB: 1.0000 | INHALATION_SPRAY | Freq: Every day | RESPIRATORY_TRACT | 11 refills | Status: DC

## 2020-11-15 NOTE — Telephone Encounter (Signed)
Patient requesting a call back regarding new medications recently prescribed; patient experiencing side effects that concern him. Blood pressure is elevated, and other side effects taking place. Please advise asap at [phone].

## 2020-11-15 NOTE — ED Triage Notes (Signed)
Pt reports SHOB, bilateral blurred, headache and generalized vision that began around 830a today. Pt denies chest pain, unilateral weakness. Pt has has no facial droop or slurred speech and strength, sensation, and grips are equal bilaterally.
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
## 2020-11-15 NOTE — Telephone Encounter (Signed)
I definitely want to see him.

## 2020-11-16 ENCOUNTER — Encounter: Payer: Self-pay | Admitting: Family Medicine

## 2020-11-16 ENCOUNTER — Telehealth: Payer: Self-pay

## 2020-11-16 ENCOUNTER — Ambulatory Visit (INDEPENDENT_AMBULATORY_CARE_PROVIDER_SITE_OTHER): Payer: Managed Care, Other (non HMO) | Admitting: Family Medicine

## 2020-11-16 VITALS — BP 142/78 | HR 110 | Temp 98.2°F | Resp 18 | Ht 69.0 in | Wt 262.0 lb

## 2020-11-16 DIAGNOSIS — R55 Syncope and collapse: Secondary | ICD-10-CM

## 2020-11-16 DIAGNOSIS — R7989 Other specified abnormal findings of blood chemistry: Secondary | ICD-10-CM

## 2020-11-16 DIAGNOSIS — G4710 Hypersomnia, unspecified: Secondary | ICD-10-CM

## 2020-11-16 NOTE — Telephone Encounter (Signed)
Transition Care Management Follow-up Telephone Call  Date of discharge and from where: 11/15/2020 from [place]   How have you been since you were released from the hospital? Pt stated that he is feeling much better today. Pt stated that his vision is improved and is aware that he has a follow up with PCP today.   Any questions or concerns? No  Items Reviewed:  Did the pt receive and understand the discharge instructions provided? Yes   Medications obtained and verified? Yes   Other? No   Any new allergies since your discharge? No   Dietary orders reviewed? n/a  Do you have support at home? Yes   Functional Questionnaire: (I = Independent and D = Dependent) ADLs: I  Bathing/Dressing- I  Meal Prep- I  Eating- I  Maintaining continence- I  Transferring/Ambulation- I  Managing Meds- I   Follow up appointments reviewed:   PCP Hospital f/u appt confirmed? Yes  Scheduled to see Jenna Luo, MD on 10/27/2020 @ 04:15pm.  [facility] f/u appt confirmed? No    Are transportation arrangements needed? No   If their condition worsens, is the pt aware to call PCP or go to the Emergency Dept.? Yes  Was the patient provided with contact information for the PCP's office or ED? Yes  Was to pt encouraged to call back with questions or concerns? Yes

## 2020-11-16 NOTE — Telephone Encounter (Signed)
Patient was evaluated in ER. Patient has F/U with PCP on 11/16/2020.
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
## 2020-11-17 ENCOUNTER — Other Ambulatory Visit: Payer: Self-pay | Admitting: Family Medicine

## 2020-11-17 ENCOUNTER — Ambulatory Visit
Admission: RE | Admit: 2020-11-17 | Discharge: 2020-11-17 | Disposition: A | Discharge status: Home / Self Care | Payer: Managed Care, Other (non HMO) | Source: Ambulatory Visit | Attending: Family Medicine | Admitting: Family Medicine

## 2020-11-17 DIAGNOSIS — R7989 Other specified abnormal findings of blood chemistry: Secondary | ICD-10-CM

## 2020-11-17 LAB — D-DIMER, QUANTITATIVE: D-Dimer, Quant: 0.68 mcg/mL FEU — ABNORMAL HIGH (ref <0.50)

## 2020-11-17 MED ORDER — IOPAMIDOL (ISOVUE-370) INJECTION 76%
75.0000 mL | Freq: Once | INTRAVENOUS | Status: AC | PRN
  Administered 2020-11-17: 75 mL via INTRAVENOUS

## 2020-11-20 ENCOUNTER — Telehealth: Payer: Self-pay

## 2020-11-20 ENCOUNTER — Telehealth: Payer: Self-pay | Admitting: *Deleted

## 2020-11-20 NOTE — Telephone Encounter (Signed)
Is he doing better off the gabapentin.  If so, then he can return to work.

## 2020-11-20 NOTE — Telephone Encounter (Signed)
Received call from patient.   He requires return to work note. Is he available to return to work today, and are there any restrictions?

## 2020-11-20 NOTE — Telephone Encounter (Signed)
Call placed to patient to inquire. LMTRC. 

## 2020-11-20 NOTE — Telephone Encounter (Signed)
Yes absolutely

## 2020-11-20 NOTE — Telephone Encounter (Signed)
Patient returned call.   States that he has been off of gabapentin and is doing well.   Letter written to return to work.

## 2020-11-21 NOTE — Telephone Encounter (Signed)
Note printed for pt to pick up at front office to return to work

## 2020-11-22 ENCOUNTER — Encounter: Payer: Self-pay | Admitting: *Deleted

## 2020-11-24 ENCOUNTER — Ambulatory Visit (INDEPENDENT_AMBULATORY_CARE_PROVIDER_SITE_OTHER): Payer: Managed Care, Other (non HMO) | Admitting: Family Medicine

## 2020-11-24 ENCOUNTER — Other Ambulatory Visit: Payer: Self-pay

## 2020-11-24 VITALS — BP 138/62 | HR 98 | Temp 98.1°F | Resp 16 | Ht 69.0 in | Wt 262.0 lb

## 2020-11-24 DIAGNOSIS — K7469 Other cirrhosis of liver: Secondary | ICD-10-CM
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
## 2020-11-27 ENCOUNTER — Ambulatory Visit: Payer: Managed Care, Other (non HMO)

## 2020-11-27 ENCOUNTER — Inpatient Hospital Stay: Payer: Managed Care, Other (non HMO) | Attending: Oncology | Admitting: Nurse Practitioner

## 2020-11-27 ENCOUNTER — Other Ambulatory Visit: Payer: Self-pay

## 2020-11-27 LAB — HEPATITIS PANEL, ACUTE
Hep A IgM: NONREACTIVE
Hep B C IgM: NONREACTIVE
Hepatitis B Surface Ag: NONREACTIVE
Hepatitis C Ab: NONREACTIVE
SIGNAL TO CUT-OFF: 0.01 (ref <1.00)

## 2020-12-06 ENCOUNTER — Ambulatory Visit: Payer: Managed Care, Other (non HMO) | Admitting: Nurse Practitioner

## 2020-12-07 ENCOUNTER — Other Ambulatory Visit: Payer: Self-pay | Admitting: Family Medicine

## 2020-12-07 ENCOUNTER — Ambulatory Visit: Payer: Managed Care, Other (non HMO) | Admitting: Nurse Practitioner

## 2020-12-08 ENCOUNTER — Other Ambulatory Visit: Payer: Self-pay | Admitting: Family Medicine

## 2020-12-12 ENCOUNTER — Other Ambulatory Visit: Payer: Self-pay | Admitting: Family Medicine

## 2020-12-14 ENCOUNTER — Other Ambulatory Visit: Payer: Self-pay | Admitting: Family Medicine

## 2020-12-15 ENCOUNTER — Other Ambulatory Visit: Payer: Self-pay | Admitting: Family Medicine

## 2020-12-20 ENCOUNTER — Other Ambulatory Visit: Payer: Self-pay | Admitting: Family Medicine

## 2021-01-26 ENCOUNTER — Other Ambulatory Visit: Payer: Self-pay | Admitting: Family Medicine

## 2021-02-24 ENCOUNTER — Other Ambulatory Visit: Payer: Self-pay | Admitting: Family Medicine

## 2021-02-26 ENCOUNTER — Encounter: Payer: Self-pay | Admitting: Family Medicine

## 2021-02-26 ENCOUNTER — Ambulatory Visit (INDEPENDENT_AMBULATORY_CARE_PROVIDER_SITE_OTHER): Payer: Managed Care, Other (non HMO) | Admitting: Family Medicine

## 2021-02-26 ENCOUNTER — Ambulatory Visit (HOSPITAL_COMMUNITY)
Admission: RE | Admit: 2021-02-26 | Discharge: 2021-02-26 | Disposition: A | Discharge status: Home / Self Care | Payer: Managed Care, Other (non HMO) | Source: Ambulatory Visit | Attending: Family Medicine | Admitting: Family Medicine

## 2021-02-26 ENCOUNTER — Other Ambulatory Visit: Payer: Self-pay

## 2021-02-26 VITALS — BP 130/74 | HR 102 | Temp 98.0°F | Resp 14 | Ht 69.0 in | Wt 251.0 lb

## 2021-02-26 DIAGNOSIS — M25562 Pain in left knee: Secondary | ICD-10-CM
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
## 2021-02-28 ENCOUNTER — Encounter: Payer: Self-pay | Admitting: Family Medicine

## 2021-03-16 ENCOUNTER — Other Ambulatory Visit: Payer: Self-pay | Admitting: Family Medicine

## 2021-03-27 ENCOUNTER — Other Ambulatory Visit: Payer: Self-pay | Admitting: Internal Medicine

## 2021-03-30 ENCOUNTER — Other Ambulatory Visit: Payer: Self-pay | Admitting: Family Medicine

## 2021-04-10 ENCOUNTER — Other Ambulatory Visit: Payer: Self-pay | Admitting: Family Medicine

## 2021-04-17 ENCOUNTER — Other Ambulatory Visit: Payer: Self-pay | Admitting: Family Medicine

## 2021-04-17 DIAGNOSIS — G629 Polyneuropathy, unspecified: Secondary | ICD-10-CM

## 2021-04-17 NOTE — Telephone Encounter (Signed)
Last ov 02/26/21  Last date filled 11/15/20

## 2021-05-31 ENCOUNTER — Other Ambulatory Visit: Payer: Self-pay | Admitting: Family Medicine

## 2021-06-01 ENCOUNTER — Other Ambulatory Visit: Payer: Managed Care, Other (non HMO)

## 2021-06-01 ENCOUNTER — Other Ambulatory Visit: Payer: Self-pay

## 2021-06-01 DIAGNOSIS — I10 Essential (primary) hypertension: Secondary | ICD-10-CM

## 2021-06-01 DIAGNOSIS — Z125 Encounter for screening for malignant neoplasm of prostate: Secondary | ICD-10-CM

## 2021-06-01 DIAGNOSIS — E1140 Type 2 diabetes mellitus with diabetic neuropathy, unspecified: Secondary | ICD-10-CM

## 2021-06-01 DIAGNOSIS — N189 Chronic kidney disease, unspecified: Secondary | ICD-10-CM

## 2021-06-01 DIAGNOSIS — Z13220 Encounter for screening for lipoid disorders: Secondary | ICD-10-CM

## 2021-06-02 LAB — LIPID PANEL
Cholesterol: 119 mg/dL (ref <200)
HDL: 36 mg/dL — ABNORMAL LOW (ref >=40)
LDL Cholesterol (Calc): 62 mg/dL (calc)
Non-HDL Cholesterol (Calc): 83 mg/dL (calc) (ref <130)
Total CHOL/HDL Ratio: 3.3 (calc) (ref <5.0)
Triglycerides: 129 mg/dL (ref <150)

## 2021-06-02 LAB — CBC WITH DIFFERENTIAL/PLATELET
Absolute Monocytes: 1229 cells/uL — ABNORMAL HIGH (ref 200–950)
Basophils Absolute: 108 cells/uL (ref 0–200)
Basophils Relative: 0.8 %
Eosinophils Absolute: 1715 cells/uL — ABNORMAL HIGH (ref 15–500)
Eosinophils Relative: 12.7 %
HCT: 43.6 % (ref 38.5–50.0)
Hemoglobin: 14.8 g/dL (ref 13.2–17.1)
Lymphs Abs: 3389 cells/uL (ref 850–3900)
MCH: 30.3 pg (ref 27.0–33.0)
MCHC: 33.9 g/dL (ref 32.0–36.0)
MCV: 89.3 fL (ref 80.0–100.0)
MPV: 9.4 fL (ref 7.5–12.5)
Monocytes Relative: 9.1 %
Neutro Abs: 7061 cells/uL (ref 1500–7800)
Neutrophils Relative %: 52.3 %
Platelets: 384 10*3/uL (ref 140–400)
RBC: 4.88 10*6/uL (ref 4.20–5.80)
RDW: 12.7 % (ref 11.0–15.0)
Total Lymphocyte: 25.1 %
WBC: 13.5 10*3/uL — ABNORMAL HIGH (ref 3.8–10.8)

## 2021-06-02 LAB — COMPLETE METABOLIC PANEL WITH GFR
AG Ratio: 1.6 (calc) (ref 1.0–2.5)
ALT: 12 U/L (ref 9–46)
AST: 13 U/L (ref 10–35)
Albumin: 4 g/dL (ref 3.6–5.1)
Alkaline phosphatase (APISO): 74 U/L (ref 35–144)
BUN/Creatinine Ratio: 12 (calc) (ref 6–22)
BUN: 17 mg/dL (ref 7–25)
CO2: 29 mmol/L (ref 20–32)
Calcium: 9.4 mg/dL (ref 8.6–10.3)
Chloride: 96 mmol/L — ABNORMAL LOW (ref 98–110)
Creat: 1.47 mg/dL — ABNORMAL HIGH (ref 0.70–1.35)
Globulin: 2.5 g/dL (calc) (ref 1.9–3.7)
Glucose, Bld: 89 mg/dL (ref 65–99)
Potassium: 4.9 mmol/L (ref 3.5–5.3)
Sodium: 134 mmol/L — ABNORMAL LOW (ref 135–146)
Total Bilirubin: 0.7 mg/dL (ref 0.2–1.2)
Total Protein: 6.5 g/dL (ref 6.1–8.1)
eGFR: 54 mL/min/{1.73_m2} — ABNORMAL LOW (ref >=60)

## 2021-06-02 LAB — HEMOGLOBIN A1C
Hgb A1c MFr Bld: 6 % of total Hgb — ABNORMAL HIGH (ref <5.7)
Mean Plasma Glucose: 126 mg/dL
eAG (mmol/L): 7 mmol/L

## 2021-06-02 LAB — PSA: PSA: 1.1 ng/mL (ref <=4.00)

## 2021-06-02 LAB — MICROALBUMIN / CREATININE URINE RATIO
Creatinine, Urine: 144 mg/dL (ref 20–320)
Microalb Creat Ratio: 5 mcg/mg creat (ref <30)
Microalb, Ur: 0.7 mg/dL

## 2021-06-11 ENCOUNTER — Other Ambulatory Visit: Payer: Self-pay | Admitting: Family Medicine

## 2021-06-19 ENCOUNTER — Other Ambulatory Visit: Payer: Self-pay | Admitting: Family Medicine

## 2021-07-13 ENCOUNTER — Other Ambulatory Visit: Payer: Self-pay | Admitting: Family Medicine

## 2021-07-17 ENCOUNTER — Other Ambulatory Visit: Payer: Self-pay | Admitting: Family Medicine

## 2021-07-25 ENCOUNTER — Telehealth: Payer: Self-pay

## 2021-07-25 DIAGNOSIS — J40 Bronchitis, not specified as acute or chronic: Secondary | ICD-10-CM

## 2021-07-25 NOTE — Telephone Encounter (Signed)
Pt's wife called to report they have both tested positive for COVID. Pt started having cough and body aches yesterday, tested positive today. He has not had fever, chills, shob or other symptoms.  Pt's wife Nevin Bloodgood) has been sick ~6 days and tested positive Monday. She is past the 5-day treatment window. She is feeling better.   Please review for possible antiviral therapy for pt. Thanks!

## 2021-07-26 ENCOUNTER — Other Ambulatory Visit: Payer: Self-pay | Admitting: Family Medicine

## 2021-07-26 MED ORDER — NIRMATRELVIR/RITONAVIR (PAXLOVID)TABLET: 3.0000 | ORAL_TABLET | Freq: Two times a day (BID) | ORAL | 0 refills | Status: AC

## 2021-07-26 NOTE — Telephone Encounter (Signed)
[facility]TR[facility] to advise of rx

## 2021-07-31 MED ORDER — ALBUTEROL SULFATE HFA 108 (90 BASE) MCG/ACT IN AERS: 2.0000 | INHALATION_SPRAY | Freq: Four times a day (QID) | RESPIRATORY_TRACT | 3 refills | Status: DC | PRN

## 2021-07-31 MED ORDER — ALBUTEROL SULFATE (2.5 MG/3ML) 0.083% IN NEBU: 2.5000 mg | INHALATION_SOLUTION | Freq: Four times a day (QID) | RESPIRATORY_TRACT | 3 refills | Status: AC | PRN

## 2021-07-31 NOTE — Telephone Encounter (Signed)
Patient called to follow up on last message; requesting call back from provider to manage sx:  congestion, cough, runny nose  Please advise at [phone].

## 2021-07-31 NOTE — Telephone Encounter (Signed)
Spoke with patient he is still not feeling great.  Advised patient to increase fluid intake try over the counter mucinex, tylenol, delsym ext.  He was not able to take paxlovid as he says he did not get the message that it was sent to pharmacy.  Inhalers refilled.  Advised patient if short of breath to seek medical attention.

## 2021-08-03 ENCOUNTER — Telehealth: Payer: Self-pay

## 2021-08-03 NOTE — Telephone Encounter (Signed)
Patient called still testing positive for covid.  He is experiencing cough, runny nose, back pain.  Denies fever.  He has had this issue since 12/28 when he called the office but was not able to pick up paxlovid.  He would like to know what other options he has.

## 2021-08-16 ENCOUNTER — Emergency Department (HOSPITAL_COMMUNITY): Payer: Managed Care, Other (non HMO)

## 2021-08-16 ENCOUNTER — Encounter (HOSPITAL_COMMUNITY): Payer: Self-pay

## 2021-08-16 ENCOUNTER — Other Ambulatory Visit: Payer: Self-pay

## 2021-08-16 ENCOUNTER — Emergency Department (HOSPITAL_COMMUNITY)
Admission: EM | Admit: 2021-08-16 | Discharge: 2021-08-16 | Disposition: A | Discharge status: Home / Self Care | Payer: Managed Care, Other (non HMO) | Attending: Emergency Medicine | Admitting: Emergency Medicine

## 2021-08-16 DIAGNOSIS — Z79899 Other long term (current) drug therapy: Secondary | ICD-10-CM | POA: Insufficient documentation

## 2021-08-16 DIAGNOSIS — Z7984 Long term (current) use of oral hypoglycemic drugs: Secondary | ICD-10-CM | POA: Diagnosis not present

## 2021-08-16 DIAGNOSIS — R0602 Shortness of breath: Secondary | ICD-10-CM | POA: Diagnosis present

## 2021-08-16 DIAGNOSIS — E119 Type 2 diabetes mellitus without complications: Secondary | ICD-10-CM | POA: Diagnosis not present

## 2021-08-16 DIAGNOSIS — Z859 Personal history of malignant neoplasm, unspecified: Secondary | ICD-10-CM | POA: Insufficient documentation

## 2021-08-16 DIAGNOSIS — R059 Cough, unspecified: Secondary | ICD-10-CM

## 2021-08-16 DIAGNOSIS — J40 Bronchitis, not specified as acute or chronic: Secondary | ICD-10-CM | POA: Diagnosis not present

## 2021-08-16 DIAGNOSIS — I10 Essential (primary) hypertension: Secondary | ICD-10-CM | POA: Insufficient documentation

## 2021-08-16 DIAGNOSIS — Z8616 Personal history of COVID-19: Secondary | ICD-10-CM | POA: Diagnosis not present

## 2021-08-16 LAB — COMPREHENSIVE METABOLIC PANEL
ALT: 19 U/L (ref 0–44)
AST: 23 U/L (ref 15–41)
Albumin: 3.7 g/dL (ref 3.5–5.0)
Alkaline Phosphatase: 72 U/L (ref 38–126)
Anion gap: 13 (ref 5–15)
BUN: 18 mg/dL (ref 8–23)
CO2: 24 mmol/L (ref 22–32)
Calcium: 8.8 mg/dL — ABNORMAL LOW (ref 8.9–10.3)
Chloride: 94 mmol/L — ABNORMAL LOW (ref 98–111)
Creatinine, Ser: 1.56 mg/dL — ABNORMAL HIGH (ref 0.61–1.24)
GFR, Estimated: 50 mL/min — ABNORMAL LOW (ref >60)
Glucose, Bld: 127 mg/dL — ABNORMAL HIGH (ref 70–99)
Potassium: 3.8 mmol/L (ref 3.5–5.1)
Sodium: 131 mmol/L — ABNORMAL LOW (ref 135–145)
Total Bilirubin: 1.1 mg/dL (ref 0.3–1.2)
Total Protein: 6.9 g/dL (ref 6.5–8.1)

## 2021-08-16 LAB — CBC WITH DIFFERENTIAL/PLATELET
Abs Immature Granulocytes: 0.05 10*3/uL (ref 0.00–0.07)
Basophils Absolute: 0.1 10*3/uL (ref 0.0–0.1)
Basophils Relative: 1 %
Eosinophils Absolute: 1.4 10*3/uL — ABNORMAL HIGH (ref 0.0–0.5)
Eosinophils Relative: 11 %
HCT: 43.2 % (ref 39.0–52.0)
Hemoglobin: 14.9 g/dL (ref 13.0–17.0)
Immature Granulocytes: 0 %
Lymphocytes Relative: 13 %
Lymphs Abs: 1.7 10*3/uL (ref 0.7–4.0)
MCH: 30.7 pg (ref 26.0–34.0)
MCHC: 34.5 g/dL (ref 30.0–36.0)
MCV: 89.1 fL (ref 80.0–100.0)
Monocytes Absolute: 0.9 10*3/uL (ref 0.1–1.0)
Monocytes Relative: 7 %
Neutro Abs: 8.5 10*3/uL — ABNORMAL HIGH (ref 1.7–7.7)
Neutrophils Relative %: 68 %
Platelets: 354 10*3/uL (ref 150–400)
RBC: 4.85 MIL/uL (ref 4.22–5.81)
RDW: 13 % (ref 11.5–15.5)
WBC: 12.7 10*3/uL — ABNORMAL HIGH (ref 4.0–10.5)
nRBC: 0 % (ref 0.0–0.2)

## 2021-08-16 LAB — D-DIMER, QUANTITATIVE: D-Dimer, Quant: 0.8 ug/mL-FEU — ABNORMAL HIGH (ref 0.00–0.50)

## 2021-08-16 LAB — BRAIN NATRIURETIC PEPTIDE: B Natriuretic Peptide: 63 pg/mL (ref 0.0–100.0)

## 2021-08-16 MED ORDER — IPRATROPIUM-ALBUTEROL 0.5-2.5 (3) MG/3ML IN SOLN
3.0000 mL | Freq: Once | RESPIRATORY_TRACT | Status: AC
  Administered 2021-08-16: 3 mL via RESPIRATORY_TRACT
  Filled 2021-08-16: qty 3

## 2021-08-16 MED ORDER — IOHEXOL 350 MG/ML SOLN
100.0000 mL | Freq: Once | INTRAVENOUS | Status: AC | PRN
  Administered 2021-08-16: 100 mL via INTRAVENOUS

## 2021-08-16 MED ORDER — PREDNISONE 50 MG PO TABS: 50.0000 mg | ORAL_TABLET | Freq: Every day | ORAL | 0 refills | Status: DC

## 2021-08-16 MED ORDER — PREDNISONE 10 MG PO TABS
60.0000 mg | ORAL_TABLET | Freq: Once | ORAL | Status: AC
  Administered 2021-08-16: 60 mg via ORAL
  Filled 2021-08-16: qty 1

## 2021-08-16 NOTE — ED Triage Notes (Signed)
Patient recently had covid prior to December 25th. Shortness of breath with increasing shortness of breath and intermittent chest pain.

## 2021-08-16 NOTE — Discharge Instructions (Addendum)
Take the steroids as prescribed.  Continue albuterol inhaler.  Follow-up with your doctor to be rechecked in the next week or so if the symptoms have not resolved

## 2021-08-16 NOTE — ED Provider Notes (Signed)
[facility] EMERGENCY DEPARTMENT Provider Note   CSN: [id number] Arrival date & time: 08/16/21  0720     History  Chief Complaint  Patient presents with   Shortness of Breath    Jason French is a 61 y.o. male.   Shortness of Breath Associated symptoms: no chest pain and no fever    Patient has history of hypertension, hyperlipidemia, diverticulitis, diabetes, bronchitis, and cancer who presents with complaints of shortness of breath.  Patient was diagnosed with COVID last month.  Patient states ever since then he has had persistent issues with shortness of breath.  Patient has been taking over-the-counter medications and also has an inhaler.  He has only use the inhaler a couple times over the last several weeks.  Patient states he was told if he had any worsening issues with his breathing to go to the hospital.   Patient states he has coughing spells and when he gets these coughing spells he starts to feel very lightheaded and tingling all over.  He was concerned about that so he came to the ED this morning.  Patient has not had any leg swelling.  No recent fevers.  He is not having any chest pain.  Patient has noticed some wheezing.  He is a chronic smoker and does continue to smoke  Home Medications Prior to Admission medications   Medication Sig Start Date End Date Taking? Authorizing Provider  albuterol (PROVENTIL) (2.5 MG/3ML) 0.083% nebulizer solution Take 3 mLs (2.5 mg total) by nebulization every 6 (six) hours as needed for wheezing or shortness of breath. Use this medication OR rescue inhaler every 6 hours as needed.  Not both. 07/31/21  Yes Susy Frizzle, MD  albuterol (VENTOLIN HFA) 108 (90 Base) MCG/ACT inhaler Inhale 2 puffs into the lungs every 6 (six) hours as needed for wheezing or shortness of breath. 07/31/21  Yes Susy Frizzle, MD  atorvastatin (LIPITOR) 10 MG tablet TAKE (1) TABLET BY MOUTH AT BEDTIME. Patient taking differently: Take 10 mg by mouth daily.  05/31/21  Yes Susy Frizzle, MD  benazepril (LOTENSIN) 40 MG tablet TAKE 1 TABLET BY MOUTH ONCE DAILY. Patient taking differently: Take 40 mg by mouth daily. 06/11/21  Yes Susy Frizzle, MD  DULoxetine (CYMBALTA) 60 MG capsule TAKE ONE CAPSULE BY MOUTH ONCE DAILY. Patient taking differently: Take 60 mg by mouth daily. 06/20/21  Yes Susy Frizzle, MD  fluticasone (FLONASE) 50 MCG/ACT nasal spray Place 2 sprays into both nostrils daily. Patient taking differently: Place 2 sprays into both nostrils daily as needed for allergies. 10/25/20  Yes Noemi Chapel A, NP  gabapentin (NEURONTIN) 300 MG capsule TAKE 2 CAPSULES BY MOUTH THREE TIMES A DAY. Patient taking differently: Take 600 mg by mouth 3 (three) times daily. 04/17/21  Yes Susy Frizzle, MD  hydrochlorothiazide (HYDRODIURIL) 25 MG tablet TAKE (1) TABLET BY MOUTH ONCE DAILY. Patient taking differently: Take 25 mg by mouth daily. 07/13/21  Yes Susy Frizzle, MD  metFORMIN (GLUCOPHAGE) 1000 MG tablet TAKE (1) TABLET BY MOUTH TWICE A DAY WITH A MEAL. Patient taking differently: Take 1,000 mg by mouth 2 (two) times daily with a meal. 03/30/21  Yes [name], [name], MD  pantoprazole (PROTONIX) 40 MG tablet TAKE (1) TABLET BY MOUTH ONCE DAILY. Patient taking differently: Take 40 mg by mouth daily. 03/27/21  Yes Annitta Needs, NP  predniSONE (DELTASONE) 50 MG tablet Take 1 tablet (50 mg total) by mouth daily. 08/16/21  Yes Tomi Bamberger,  Wille Glaser, MD  rOPINIRole (REQUIP) 1 MG tablet TAKE (2) TABLETS BY MOUTH AT BEDTIME. Patient taking differently: Take 2 mg by mouth at bedtime. 07/13/21  Yes Susy Frizzle, MD  umeclidinium-vilanterol (ANORO ELLIPTA) 62.5-25 MCG/INH AEPB Inhale 1 puff into the lungs daily. Patient not taking: Reported on 08/16/2021 11/15/20   Susy Frizzle, MD      Allergies    Patient has no known allergies.    Review of Systems   Review of Systems  Constitutional:  Negative for fever.  Respiratory:  Positive for  shortness of breath.   Cardiovascular:  Negative for chest pain.  Neurological:  Positive for light-headedness.   Physical Exam Updated Vital Signs BP 108/76    Pulse 100    Temp 98.3 F (36.8 C) (Oral)    Resp 20    Ht 1.753 m (5\' 9" )    Wt 113.4 kg    SpO2 94%    BMI 36.92 kg/m  Physical Exam Vitals and nursing note reviewed.  Constitutional:      Appearance: He is well-developed. He is not diaphoretic.  HENT:     Head: Normocephalic and atraumatic.     Right Ear: External ear normal.     Left Ear: External ear normal.  Eyes:     General: No scleral icterus.       Right eye: No discharge.        Left eye: No discharge.     Conjunctiva/sclera: Conjunctivae normal.  Neck:     Trachea: No tracheal deviation.  Cardiovascular:     Rate and Rhythm: Normal rate and regular rhythm.  Pulmonary:     Effort: Pulmonary effort is normal. Tachypnea present. No respiratory distress.     Breath sounds: Normal breath sounds. No stridor. No rales.     Comments: Occasional wheeze noted when coughing Abdominal:     General: Bowel sounds are normal. There is no distension.     Palpations: Abdomen is soft.     Tenderness: There is no abdominal tenderness. There is no guarding or rebound.  Musculoskeletal:        General: No tenderness or deformity.     Cervical back: Neck supple.  Skin:    General: Skin is warm and dry.     Findings: No rash.  Neurological:     General: No focal deficit present.     Mental Status: He is alert.     Cranial Nerves: No cranial nerve deficit (no facial droop, extraocular movements intact, no slurred speech).     Sensory: No sensory deficit.     Motor: No abnormal muscle tone or seizure activity.     Coordination: Coordination normal.  Psychiatric:        Mood and Affect: Mood normal.    ED Results / Procedures / Treatments   Labs (all labs ordered are listed, but only abnormal results are displayed) Labs Reviewed  COMPREHENSIVE METABOLIC PANEL -  Abnormal; Notable for the following components:      Result Value   Sodium 131 (*)    Chloride 94 (*)    Glucose, Bld 127 (*)    Creatinine, Ser 1.56 (*)    Calcium 8.8 (*)    GFR, Estimated 50 (*)    All other components within normal limits  CBC WITH DIFFERENTIAL/PLATELET - Abnormal; Notable for the following components:   WBC 12.7 (*)    Neutro Abs 8.5 (*)    Eosinophils Absolute 1.4 (*)  All other components within normal limits  D-DIMER, QUANTITATIVE - Abnormal; Notable for the following components:   D-Dimer, Quant 0.80 (*)    All other components within normal limits  BRAIN NATRIURETIC PEPTIDE    EKG EKG Interpretation  Date/Time:  Thursday August 16 2021 07:41:25 EST Ventricular Rate:  107 PR Interval:  152 QRS Duration: 93 QT Interval:  348 QTC Calculation: 465 R Axis:   213 Text Interpretation: Sinus tachycardia Right axis deviation Abnormal R-wave progression, late transition No significant change since last tracing Confirmed by Dorie Rank [phone]) on 08/16/2021 7:54:57 AM  Radiology CT Angio Chest PE W and/or Wo Contrast  Result Date: 08/16/2021 CLINICAL DATA:  Positive D-dimer. Pulmonary embolus suspected. Shortness of breath. EXAM: CT ANGIOGRAPHY CHEST WITH CONTRAST TECHNIQUE: Multidetector CT imaging of the chest was performed using the standard protocol during bolus administration of intravenous contrast. Multiplanar CT image reconstructions and MIPs were obtained to evaluate the vascular anatomy. RADIATION DOSE REDUCTION: This exam was performed according to the departmental dose-optimization program which includes automated exposure control, adjustment of the mA and/or kV according to patient size and/or use of iterative reconstruction technique. CONTRAST:  179mL OMNIPAQUE IOHEXOL 350 MG/ML SOLN COMPARISON:  11/17/2020 FINDINGS: Cardiovascular: The heart size is normal. No substantial pericardial effusion. Coronary artery calcification is evident. Mild  atherosclerotic calcification is noted in the wall of the thoracic aorta. There is no filling defect within the opacified pulmonary arteries to suggest the presence of an acute pulmonary embolus. Mediastinum/Nodes: No mediastinal lymphadenopathy. There is no hilar lymphadenopathy. The esophagus has normal imaging features. There is no axillary lymphadenopathy. Lungs/Pleura: Centrilobular and paraseptal emphysema evident. No suspicious pulmonary nodule or mass. No focal airspace consolidation. No pleural effusion. 4 mm posterior left upper lobe perifissural nodule on 41/6 is stable since prior, compatible with benign etiology such as subpleural lymph node. Upper Abdomen: The liver shows diffusely decreased attenuation suggesting fat deposition. Musculoskeletal: No worrisome lytic or sclerotic osseous abnormality. Review of the MIP images confirms the above findings. IMPRESSION: 1. No CT evidence for acute pulmonary embolus. 2. Hepatic steatosis. 3. Aortic Atherosclerosis (ICD10-I70.0) and Emphysema (ICD10-J43.9). Electronically Signed   By: Misty Stanley M.D.   On: 08/16/2021 10:26   DG Chest Port 1 View  Result Date: 08/16/2021 CLINICAL DATA:  Recent COVID, shortness of breath, intermittent chest pain EXAM: PORTABLE CHEST 1 VIEW COMPARISON:  Chest radiograph 11/15/2020 FINDINGS: The cardiomediastinal silhouette is stable, with unchanged borderline cardiomegaly. Linear opacities in the right base are favored to reflect atelectasis or scar. There is no focal consolidation or pulmonary edema. There is no pleural effusion or pneumothorax There is no acute osseous abnormality. IMPRESSION: 1. Linear opacities in the right base favored to reflect atelectasis or scar. No focal consolidation or pleural effusion. 2. Unchanged borderline cardiomegaly. Electronically Signed   By: Valetta Mole M.D.   On: 08/16/2021 08:41    Procedures .1-3 Lead EKG Interpretation Performed by: Dorie Rank, MD Authorized by: Dorie Rank,  MD     Interpretation: normal     ECG rate:  100   ECG rate assessment: tachycardic     Rhythm: sinus tachycardia     Ectopy: none     Conduction: normal      Medications Ordered in ED Medications  ipratropium-albuterol (DUONEB) 0.5-2.5 (3) MG/3ML nebulizer solution 3 mL (3 mLs Nebulization Given 08/16/21 0829)  predniSONE (DELTASONE) tablet 60 mg (60 mg Oral Given 08/16/21 0839)  iohexol (OMNIPAQUE) 350 MG/ML injection 100 mL (100 mLs Intravenous  Contrast Given 08/16/21 1010)    ED Course/ Medical Decision Making/ A&P Clinical Course as of 08/16/21 1111  Thu Aug 16, 2021  0904 D-dimer increased at 0.8. [JK]  0905 Comprehensive metabolic panel(!) Creatinine elevated at 1.56 but similar to baseline [JK]  0905 DG Chest [facility] Chest x-ray images and results reviewed.  No definite pneumonia [JK]  0909 Reviewed findings with patient.  We will proceed with CT angio [JK]  1103 CT Angio Chest PE W and/or Wo Contrast CT chest without acute findings [JK]    Clinical Course User Index [JK] Dorie Rank, MD                           Medical Decision Making Amount and/or Complexity of Data Reviewed Labs: ordered. Decision-making details documented in ED Course. Radiology: ordered. Decision-making details documented in ED Course.  Risk Prescription drug management.   Patient presented with complaints of shortness of breath.  Diagnosed with COVID the end of December.  Concerned about the possibility of pneumonia pulmonary embolism, bronchitis with bronchospasm.  ED work-up reassuring.  No signs of anemia or dehydration.  Chest x-ray did not show an infiltrate.  D-dimer was elevated so CT angiogram was performed.  No signs of PE.  Patient was treated with a DuoNeb treatment and steroids.  He is feeling much better now.  Cough is decreased and is feels like he is breathing more easily.  Patient does have an inhaler at home.  Will discharge home with a course of steroids.  Discussed warning  signs precautions outpatient follow-up        Final Clinical Impression(s) / ED Diagnoses Final diagnoses:  Bronchitis    Rx / DC Orders ED Discharge Orders          Ordered    predniSONE (DELTASONE) 50 MG tablet  Daily        08/16/21 1109              Dorie Rank, MD 08/16/21 1111

## 2021-08-21 ENCOUNTER — Encounter: Payer: Self-pay | Admitting: Family Medicine

## 2021-08-21 ENCOUNTER — Other Ambulatory Visit: Payer: Self-pay

## 2021-08-21 ENCOUNTER — Ambulatory Visit (INDEPENDENT_AMBULATORY_CARE_PROVIDER_SITE_OTHER): Payer: Managed Care, Other (non HMO) | Admitting: Family Medicine

## 2021-08-21 VITALS — BP 118/68 | HR 109 | Temp 96.8°F | Resp 18 | Ht 69.0 in | Wt 258.0 lb

## 2021-08-21 DIAGNOSIS — J439 Emphysema, unspecified: Secondary | ICD-10-CM | POA: Diagnosis not present

## 2021-08-21 DIAGNOSIS — F17200 Nicotine dependence, unspecified, uncomplicated: Secondary | ICD-10-CM | POA: Diagnosis not present

## 2021-08-21 NOTE — Progress Notes (Signed)
Subjective:    Patient ID: Jason French, male    DOB: Aug 15, 1960, 61 y.o.   MRN: [id number]  Patient had [place] right after Christmas.  Patient reports shortness of breath and coughing.  He states that sometimes he coughs so hard that he sees stars and he feels like he cannot pass out.  He reports shortness of breath with activity.  He denies any chest pain.  He denies any hemoptysis.  He denies any pleurisy.  He recently went to the emergency room.  Chest x-ray showed bibasilar atelectasis.  D-dimer was elevated so they did a CT angiogram that showed no pulmonary embolism.  It did show questionable findings of nonalcoholic steatohepatitis and cirrhosis.  He has known chronic kidney disease.  Continues to smoke.  States that he was smoking up to 2-1/2 packs of cigarettes a day.  He is try to cut back some. Past Medical History:  Diagnosis Date   Arthritis    Bronchitis 10/27/2020   Cancer [facility])    adrenal cancer   Diabetes mellitus without complication ([place])    Diverticulitis 2006   s/p sigmoid colectomy, continues with scattered pancolinic diverticula   Hyperlipidemia    Hypertension    Neuropathy    RLS (restless legs syndrome)    Sleep apnea    use CPAP   Past Surgical History:  Procedure Laterality Date   BIOPSY  03/13/2020   Procedure: BIOPSY;  Surgeon: Daneil Dolin, MD;  Location: [facility] ENDO [facility];  Service: Endoscopy;;   COLON SURGERY  2006   ruptured diverticulosis; sigmoid resection.    COLONOSCOPY  10/26/2004   Dr. Gala Romney; internal hemorrhoids, few scattered pancolonic diverticula, 3 left colon polyps.  Cannot locate path in chart.   COLONOSCOPY  03/19/2010   Friable anal canal, diminutive polyps about the sigmoid and anastomosis at 30 cm ablated and/or snare removed, multiple polyps from ileocecal valve biopsied/ ablated/or snared.  Residual scattered pancolonic diverticula.  Suspected rectal bleeding from anorectum in the setting of IBS.  Ileocecal pathology with tubular  adenomas, otherwise hyperplastic polyps/benign small bowel mucosa. 7 yr repeat   COLONOSCOPY WITH PROPOFOL N/A 03/13/2020   Procedure: COLONOSCOPY WITH PROPOFOL;  Surgeon: Daneil Dolin, MD;  Location: [facility] END[facility];  Service: Endoscopy;  Laterality: N/A;  7:30am   ESOPHAGOGASTRODUODENOSCOPY (EGD) WITH PROPOFOL N/A 03/13/2020   Procedure: ESOPHAGOGASTRODUODENOSCOPY (EGD) WITH PROPOFOL;  Surgeon: Daneil Dolin, MD;  Location: [facility];  Service: Endoscopy;  Laterality: N/A;   KNEE ARTHROSCOPY WITH MEDIAL MENISECTOMY Right 06/12/2017   Procedure: KNEE ARTHROSCOPY WITH PARTIAL MEDIAL MENISECTOMY;  Surgeon: [name], MD;  Location: [facility] ORS;  Service: Orthopedics;  Laterality: Right;   MALONEY DILATION N/A 03/13/2020   Procedure: Venia Minks DILATION;  Surgeon: Daneil Dolin, MD;  Location: [facility];  Service: Endoscopy;  Laterality: N/A;   POLYPECTOMY  03/13/2020   Procedure: POLYPECTOMY;  Surgeon: Daneil Dolin, MD;  Location: [facility];  Service: Endoscopy;;   RESECTION OF ABDOMINAL MASS     adrenal mass right (cancer)   TOTAL KNEE ARTHROPLASTY Right 07/13/2018   Procedure: RIGHT TOTAL KNEE ARTHROPLASTY,INJECTION OF LEFT KNEE;  Surgeon: Gaynelle Arabian, MD;  Location: [facility] ORS;  Service: Orthopedics;  Laterality: Right;  10min   Current Outpatient Medications on File Prior to Visit  Medication Sig Dispense Refill   albuterol (PROVENTIL) (2.5 MG/3ML) 0.083% nebulizer solution Take 3 mLs (2.5 mg total) by nebulization every 6 (six) hours as needed for wheezing or  shortness of breath. Use this medication OR rescue inhaler every 6 hours as needed.  Not both. 150 mL 3   albuterol (VENTOLIN HFA) 108 (90 Base) MCG/ACT inhaler Inhale 2 puffs into the lungs every 6 (six) hours as needed for wheezing or shortness of breath. 1 each 3   atorvastatin (LIPITOR) 10 MG tablet TAKE (1) TABLET BY MOUTH AT BEDTIME. 90 tablet 0   benazepril (LOTENSIN) 40 MG tablet TAKE 1 TABLET BY MOUTH ONCE  DAILY. 90 tablet 2   DULoxetine (CYMBALTA) 60 MG capsule TAKE ONE CAPSULE BY MOUTH ONCE DAILY. 90 capsule 0   fluticasone (FLONASE) 50 MCG/ACT nasal spray Place 2 sprays into both nostrils daily. 16 g 6   gabapentin (NEURONTIN) 300 MG capsule TAKE 2 CAPSULES BY MOUTH THREE TIMES A DAY. 540 capsule 0   hydrochlorothiazide (HYDRODIURIL) 25 MG tablet TAKE (1) TABLET BY MOUTH ONCE DAILY. 90 tablet 0   metFORMIN (GLUCOPHAGE) 1000 MG tablet TAKE (1) TABLET BY MOUTH TWICE A DAY WITH A MEAL. 180 tablet 0   pantoprazole (PROTONIX) 40 MG tablet TAKE (1) TABLET BY MOUTH ONCE DAILY. 90 tablet 3   rOPINIRole (REQUIP) 1 MG tablet TAKE (2) TABLETS BY MOUTH AT BEDTIME. 180 tablet 0   umeclidinium-vilanterol (ANORO ELLIPTA) 62.5-25 MCG/INH AEPB Inhale 1 puff into the lungs daily. (Patient not taking: Reported on 08/16/2021) 30 each 11   No current facility-administered medications on file prior to visit.   No Known Allergies Social History   Socioeconomic History   Marital status: Married    Spouse name: Not on file   Number of children: Not on file   Years of education: Not on file   Highest education level: Not on file  Occupational History   Not on file  Tobacco Use   Smoking status: Every Day    Packs/day: 2.00    Years: 40.00    Pack years: 80.00    Types: Cigarettes   Smokeless tobacco: Never  Vaping Use   Vaping Use: Never used  Substance and Sexual Activity   Alcohol use: Yes    Comment: once a month   Drug use: No   Sexual activity: Yes  Other Topics Concern   Not on file  Social History Narrative   Not on file   Social Determinants of Health   Financial Resource Strain: Not on file  Food Insecurity: Not on file  Transportation Needs: Not on file  Physical Activity: Not on file  Stress: Not on file  Social Connections: Not on file  Intimate Partner Violence: Not on file      Review of Systems  All other systems reviewed and are negative.     Objective:   Physical  Exam Vitals reviewed.  Constitutional:      General: He is not in acute distress.    Appearance: He is well-developed. He is not diaphoretic.  HENT:     Head: Normocephalic and atraumatic.     Right Ear: External ear normal.     Left Ear: External ear normal.     Nose: Nose normal.     Mouth/Throat:     Pharynx: No oropharyngeal exudate.  Eyes:     General: No scleral icterus.       Right eye: No discharge.        Left eye: No discharge.     Conjunctiva/sclera: Conjunctivae normal.     Pupils: Pupils are equal, round, and reactive to light.  Neck:  Thyroid: No thyromegaly.     Vascular: No JVD.     Trachea: No tracheal deviation.  Cardiovascular:     Rate and Rhythm: Normal rate and regular rhythm.     Heart sounds: Normal heart sounds. No murmur heard.   No friction rub. No gallop.  Pulmonary:     Effort: Pulmonary effort is normal. No respiratory distress.     Breath sounds: Normal breath sounds. No stridor. No wheezing or rales.  Chest:     Chest wall: No tenderness.  Abdominal:     General: Bowel sounds are normal. There is no distension.     Palpations: Abdomen is soft. There is no mass.     Tenderness: There is no abdominal tenderness. There is no guarding or rebound.  Musculoskeletal:        General: No tenderness or deformity. Normal range of motion.     Cervical back: Normal range of motion and neck supple.  Lymphadenopathy:     Cervical: No cervical adenopathy.  Skin:    General: Skin is warm.     Coloration: Skin is not pale.     Findings: No erythema or rash.  Neurological:     Mental Status: He is alert and oriented to person, place, and time.     Cranial Nerves: No cranial nerve deficit.     Motor: No abnormal muscle tone.     Coordination: Coordination normal.     Deep Tendon Reflexes: Reflexes are normal and symmetric.  Psychiatric:        Behavior: Behavior normal.        Thought Content: Thought content normal.        Judgment: Judgment  normal.          Assessment & Plan:  Current every day smoker  Pulmonary emphysema, unspecified emphysema type ([place]) She has not on any maintenance therapy.  He has never had formal pulmonary function test.  I would like to try the patient empirically on Breztri 2 inhalations twice daily.  I also spent a large amount of time explaining the importance of smoking cessation.  I believe that the smoking slowly killing him.  I believe that the [place] only highlighted the underlying lung damage due to the emphysema that he has.  Therefore I would like to see how he responds to the 1 month of samples that I gave him of breztri and I emphasized the need for smoking cessation.  If shortness of breath is not improving, consider a stress test along with a formal referral to pulmonology for pulmonary function test

## 2021-09-10 ENCOUNTER — Other Ambulatory Visit: Payer: Self-pay | Admitting: Family Medicine

## 2021-10-05 ENCOUNTER — Other Ambulatory Visit: Payer: Self-pay | Admitting: Family Medicine

## 2021-10-05 DIAGNOSIS — G629 Polyneuropathy, unspecified: Secondary | ICD-10-CM

## 2021-10-11 ENCOUNTER — Other Ambulatory Visit: Payer: Self-pay | Admitting: Family Medicine

## 2021-10-12 ENCOUNTER — Telehealth: Payer: Self-pay | Admitting: Family Medicine

## 2021-10-12 ENCOUNTER — Other Ambulatory Visit: Payer: Self-pay

## 2021-10-12 ENCOUNTER — Encounter: Payer: Self-pay | Admitting: Family Medicine

## 2021-10-12 ENCOUNTER — Ambulatory Visit (INDEPENDENT_AMBULATORY_CARE_PROVIDER_SITE_OTHER): Payer: Managed Care, Other (non HMO) | Admitting: Family Medicine

## 2021-10-12 VITALS — BP 118/62 | HR 125 | Temp 97.2°F | Resp 18 | Ht 69.0 in | Wt 255.0 lb

## 2021-10-12 DIAGNOSIS — J439 Emphysema, unspecified: Secondary | ICD-10-CM | POA: Diagnosis not present

## 2021-10-12 MED ORDER — FLUTICASONE-SALMETEROL 230-21 MCG/ACT IN AERO: 2.0000 | INHALATION_SPRAY | Freq: Two times a day (BID) | RESPIRATORY_TRACT | 12 refills | Status: DC

## 2021-10-12 NOTE — Telephone Encounter (Signed)
Patient made aware of outstanding balance for previous visits dating back to 2019; states he didn't realize those visits hadn't been paid. Please resubmit to insurance for back payment; several visits show insurance hasn't paid anything. ? ?Patient's insurance was [employer], id # [id number] (effective 07/29/2016 - 07/30/2019) ?Current insurance is [employer] Scheuermann, id # [id number], (effective 07/29/2018 and currently active). ? ?Please advise at [phone].  ? ?

## 2021-10-12 NOTE — Progress Notes (Signed)
? ?Subjective:  ? ? Patient ID: Jason French, male    DOB: Oct 01, 1960, 61 y.o.   MRN: [id number] ?08/21/21 ?Patient had COVID right after Christmas.  Patient reports shortness of breath and coughing.  He states that sometimes he coughs so hard that he sees stars and he feels like he cannot pass out.  He reports shortness of breath with activity.  He denies any chest pain.  He denies any hemoptysis.  He denies any pleurisy.  He recently went to the emergency room.  Chest x-ray showed bibasilar atelectasis.  D-dimer was elevated so they did a CT angiogram that showed no pulmonary embolism.  It did show questionable findings of nonalcoholic steatohepatitis and cirrhosis.  He has known chronic kidney disease.  Continues to smoke.  States that he was smoking up to 2-1/2 packs of cigarettes a day.  He is trying to cut back some.  At that time, my plan was: ? ?She has not on any maintenance therapy.  He has never had formal pulmonary function test.  I would like to try the patient empirically on Breztri 2 inhalations twice daily.  I also spent a large amount of time explaining the importance of smoking cessation.  I believe that the smoking slowly killing him.  I believe that the [place] only highlighted the underlying lung damage due to the emphysema that he has.  Therefore I would like to see how he responds to the 1 month of samples that I gave him of breztri and I emphasized the need for smoking cessation.  If shortness of breath is not improving, consider a stress test along with a formal referral to pulmonology for pulmonary function test ? ?10/12/21 ?Patient states that his breathing is about 50% better on the breztri.  He is sleeping better at night.  His shortness of breath has improved.  He denies any chest pain or pleurisy.  He continues to have to use his rescue albuterol sporadically once or twice a week however his breathing is definitely improved compared to 2 months ago.  He denies any hemoptysis or angina.   He denies any orthopnea.  However his insurance is going to charge him $600 for breztri.  ?Past Medical History:  ?Diagnosis Date  ? Arthritis   ? Bronchitis 10/27/2020  ? Cancer [facility])   ? adrenal cancer  ? Diabetes mellitus without complication ([place])   ? Diverticulitis 2006  ? s/p sigmoid colectomy, continues with scattered pancolinic diverticula  ? Hyperlipidemia   ? Hypertension   ? Neuropathy   ? RLS (restless legs syndrome)   ? Sleep apnea   ? use CPAP  ? ?Past Surgical History:  ?Procedure Laterality Date  ? BIOPSY  03/13/2020  ? Procedure: BIOPSY;  Surgeon: Daneil Dolin, MD;  [facility];  Service: Endoscopy;;  ? COLON SURGERY  2006  ? ruptured diverticulosis; sigmoid resection.   ? COLONOSCOPY  10/26/2004  ? Dr. Gala Romney; internal hemorrhoids, few scattered pancolonic diverticula, 3 left colon polyps.  Cannot locate path in chart.  ? COLONOSCOPY  03/19/2010  ? Friable anal canal, diminutive polyps about the sigmoid and anastomosis at 30 cm ablated and/or snare removed, multiple polyps from ileocecal valve biopsied/ ablated/or snared.  Residual scattered pancolonic diverticula.  Suspected rectal bleeding from anorectum in the setting of IBS.  Ileocecal pathology with tubular adenomas, otherwise hyperplastic polyps/benign small bowel mucosa. 7 yr repeat  ? COLONOSCOPY WITH PROPOFOL N/A 03/13/2020  ? Procedure: COLONOSCOPY WITH PROPOFOL;  Surgeon:  Rourk, Cristopher Estimable, MD;  [facility];  Service: Endoscopy;  Laterality: N/A;  7:30am  ? ESOPHAGOGASTRODUODENOSCOPY (EGD) WITH PROPOFOL N/A 03/13/2020  ? Procedure: ESOPHAGOGASTRODUODENOSCOPY (EGD) WITH PROPOFOL;  Surgeon: Daneil Dolin, MD;  [facility];  Service: Endoscopy;  Laterality: N/A;  ? KNEE ARTHROSCOPY WITH MEDIAL MENISECTOMY Right 06/12/2017  ? Procedure: KNEE ARTHROSCOPY WITH PARTIAL MEDIAL MENISECTOMY;  Surgeon: [name], MD;  [facility]: [facility];  Service: Orthopedics;  Laterality: Right;  ? MALONEY DILATION N/A  03/13/2020  ? Procedure: MALONEY DILATION;  Surgeon: Daneil Dolin, MD;  [facility];  Service: Endoscopy;  Laterality: N/A;  ? POLYPECTOMY  03/13/2020  ? Procedure: POLYPECTOMY;  Surgeon: Daneil Dolin, MD;  [facility];  Service: Endoscopy;;  ? RESECTION OF ABDOMINAL MASS    ? adrenal mass right (cancer)  ? TOTAL KNEE ARTHROPLASTY Right 07/13/2018  ? Procedure: RIGHT TOTAL KNEE ARTHROPLASTY,INJECTION OF LEFT KNEE;  Surgeon: Gaynelle Arabian, MD;  [facility];  Service: Orthopedics;  Laterality: Right;  66mn  ? ?Current Outpatient Medications on File Prior to Visit  ?Medication Sig Dispense Refill  ? albuterol (PROVENTIL) (2.5 MG/3ML) 0.083% nebulizer solution Take 3 mLs (2.5 mg total) by nebulization every 6 (six) hours as needed for wheezing or shortness of breath. Use this medication OR rescue inhaler every 6 hours as needed.  Not both. 150 mL 3  ? albuterol (VENTOLIN HFA) 108 (90 Base) MCG/ACT inhaler Inhale 2 puffs into the lungs every 6 (six) hours as needed for wheezing or shortness of breath. 1 each 3  ? atorvastatin (LIPITOR) 10 MG tablet TAKE (1) TABLET BY MOUTH AT BEDTIME. 90 tablet 3  ? benazepril (LOTENSIN) 40 MG tablet TAKE 1 TABLET BY MOUTH ONCE DAILY. 90 tablet 2  ? DULoxetine (CYMBALTA) 60 MG capsule TAKE ONE CAPSULE BY MOUTH ONCE DAILY. 90 capsule 0  ? fluticasone (FLONASE) 50 MCG/ACT nasal spray Place 2 sprays into both nostrils daily. 16 g 6  ? gabapentin (NEURONTIN) 300 MG capsule TAKE 2 CAPSULES BY MOUTH THREE TIMES A DAY. 540 capsule 0  ? hydrochlorothiazide (HYDRODIURIL) 25 MG tablet TAKE (1) TABLET BY MOUTH ONCE DAILY. 90 tablet 0  ? metFORMIN (GLUCOPHAGE) 1000 MG tablet TAKE (1) TABLET BY MOUTH TWICE A DAY WITH A MEAL. 180 tablet 0  ? pantoprazole (PROTONIX) 40 MG tablet TAKE (1) TABLET BY MOUTH ONCE DAILY. 90 tablet 3  ? rOPINIRole (REQUIP) 1 MG tablet TAKE (2) TABLETS BY MOUTH AT BEDTIME. 180 tablet 0  ? umeclidinium-vilanterol (ANORO ELLIPTA) 62.5-25  MCG/INH AEPB Inhale 1 puff into the lungs daily. (Patient not taking: Reported on 08/16/2021) 30 each 11  ? ?No current facility-administered medications on file prior to visit.  ? ?No Known Allergies ?Social History  ? ?Socioeconomic History  ? Marital status: Married  ?  Spouse name: Not on file  ? Number of children: Not on file  ? Years of education: Not on file  ? Highest education level: Not on file  ?Occupational History  ? Not on file  ?Tobacco Use  ? Smoking status: Every Day  ?  Packs/day: 2.00  ?  Years: 40.00  ?  Pack years: 80.00  ?  Types: Cigarettes  ? Smokeless tobacco: Never  ?Vaping Use  ? Vaping Use: Never used  ?Substance and Sexual Activity  ? Alcohol use: Yes  ?  Comment: once a month  ? Drug use: No  ? Sexual activity: Yes  ?Other  Topics Concern  ? Not on file  ?Social History Narrative  ? Not on file  ? ?Social Determinants of Health  ? ?Financial Resource Strain: Not on file  ?Food Insecurity: Not on file  ?Transportation Needs: Not on file  ?Physical Activity: Not on file  ?Stress: Not on file  ?Social Connections: Not on file  ?Intimate Partner Violence: Not on file  ? ? ? ? ?Review of Systems  ?All other systems reviewed and are negative. ? ?   ?Objective:  ? Physical Exam ?Vitals reviewed.  ?Constitutional:   ?   General: He is not in acute distress. ?   Appearance: He is well-developed. He is not diaphoretic.  ?HENT:  ?   Head: Normocephalic and atraumatic.  ?   Right Ear: External ear normal.  ?   Left Ear: External ear normal.  ?   Nose: Nose normal.  ?   Mouth/Throat:  ?   Pharynx: No oropharyngeal exudate.  ?Eyes:  ?   General: No scleral icterus.    ?   Right eye: No discharge.     ?   Left eye: No discharge.  ?   Conjunctiva/sclera: Conjunctivae normal.  ?   Pupils: Pupils are equal, round, and reactive to light.  ?Neck:  ?   Thyroid: No thyromegaly.  ?   Vascular: No JVD.  ?   Trachea: No tracheal deviation.  ?Cardiovascular:  ?   Rate and Rhythm: Normal rate and regular rhythm.   ?   Heart sounds: Normal heart sounds. No murmur heard. ?  No friction rub. No gallop.  ?Pulmonary:  ?   Effort: Pulmonary effort is normal. No respiratory distress.  ?   Breath sounds: Normal breath sounds. No

## 2021-10-17 ENCOUNTER — Other Ambulatory Visit: Payer: Self-pay | Admitting: Family Medicine

## 2021-11-01 ENCOUNTER — Telehealth: Payer: Self-pay | Admitting: Family Medicine

## 2021-11-01 NOTE — Telephone Encounter (Signed)
After reviewing patients account I see that outstanding balance has been filed to insurance properly except for one dos of 08/09/2019 which was after insurance had termed. The following is what patient owes to bad debt: ?11/24/20 $28.53 cigna deductible amount ?11/16/20 $41.68 cigna deductible amount ?11/06/20 $278.34 cigna deductible amount ?10/25/20 $142.63 cigna deductible amount ?04/07/20 $28.53 cigna deductible amount ?08/09/2019 $175.00 Per [employer] expenses occurred after coverage was termed ?06/10/19 $208.82 [employer] deductible amount ?01/18/19 $141.89 [employer] deductible amount ?08/20/18 $204.96 [employer] deductible amount ?05/14/18 204.96 [employer] deductible amount ?12/11/17 $206.17 [employer] deductible amount ?He also has an outstanding balance of $210.45 cigna deductible amount from DOS 08/21/21 ? ?I have left vm for patient so I can advise him of the above information.  ? ?CB# [phone] ?

## 2021-11-01 NOTE — Telephone Encounter (Signed)
Patient returned missed call; unsure of who was trying to contact him.  ?

## 2021-11-07 ENCOUNTER — Other Ambulatory Visit: Payer: Self-pay | Admitting: Family Medicine

## 2021-12-11 ENCOUNTER — Other Ambulatory Visit: Payer: Self-pay | Admitting: Family Medicine

## 2021-12-12 NOTE — Telephone Encounter (Signed)
Requested Prescriptions  ?Pending Prescriptions Disp Refills  ?? benazepril (LOTENSIN) 40 MG tablet [Pharmacy Med Name: BENAZEPRIL HCL 40 MG TABLETS] 90 tablet 0  ?  Sig: TAKE 1 TABLET BY MOUTH ONCE DAILY.  ?  ? Cardiovascular:  ACE Inhibitors Failed - 12/11/2021 10:42 AM  ?  ?  Failed - Cr in normal range and within 180 days  ?  Creat  ?Date Value Ref Range Status  ?06/01/2021 1.47 (H) 0.70 - 1.35 mg/dL Final  ? ?Creatinine, Ser  ?Date Value Ref Range Status  ?08/16/2021 1.56 (H) 0.61 - 1.24 mg/dL Final  ? ?Creatinine, Urine  ?Date Value Ref Range Status  ?06/01/2021 144 20 - 320 mg/dL Final  ?   ?  ?  Passed - K in normal range and within 180 days  ?  Potassium  ?Date Value Ref Range Status  ?08/16/2021 3.8 3.5 - 5.1 mmol/L Final  ?   ?  ?  Passed - Patient is not pregnant  ?  ?  Passed - Last BP in normal range  ?  BP Readings from Last 1 Encounters:  ?10/12/21 118/62  ?   ?  ?  Passed - Valid encounter within last 6 months  ?  Recent Outpatient Visits   ?      ? 2 months ago Pulmonary emphysema, unspecified emphysema type ([place])  ? [name], [name], MD  ? 3 months ago Current every day smoker  ? [name], [name], MD  ? 9 months ago Acute pain of left knee  ? [name], [name], MD  ? 1 year ago Other cirrhosis of liver [facility])  ? [name], [name], MD  ? 1 year ago Positive D dimer  ? [name], [name], MD  ?  ?  ? ?  ?  ?  ? ?

## 2021-12-25 ENCOUNTER — Ambulatory Visit (INDEPENDENT_AMBULATORY_CARE_PROVIDER_SITE_OTHER): Payer: Managed Care, Other (non HMO) | Admitting: Family Medicine

## 2021-12-25 ENCOUNTER — Ambulatory Visit (HOSPITAL_COMMUNITY)
Admission: RE | Admit: 2021-12-25 | Discharge: 2021-12-25 | Disposition: A | Discharge status: Home / Self Care | Payer: Managed Care, Other (non HMO) | Source: Ambulatory Visit | Attending: Family Medicine | Admitting: Family Medicine

## 2021-12-25 ENCOUNTER — Ambulatory Visit: Payer: Self-pay | Admitting: *Deleted

## 2021-12-25 VITALS — BP 142/96 | HR 104 | Temp 98.8°F | Ht 69.0 in | Wt 261.0 lb

## 2021-12-25 DIAGNOSIS — M79671 Pain in right foot: Secondary | ICD-10-CM

## 2021-12-25 MED ORDER — BREZTRI AEROSPHERE 160-9-4.8 MCG/ACT IN AERO: 2.0000 | INHALATION_SPRAY | Freq: Two times a day (BID) | RESPIRATORY_TRACT | 11 refills | Status: DC

## 2021-12-25 MED ORDER — HYDROCODONE-ACETAMINOPHEN 5-325 MG PO TABS: 1.0000 | ORAL_TABLET | Freq: Four times a day (QID) | ORAL | 0 refills | Status: AC | PRN

## 2021-12-25 NOTE — Progress Notes (Signed)
Subjective:    Patient ID: Jason French, male    DOB: [birth year], 61 y.o.   MRN: [id number] Patient fell on Friday and suffered a contusion to his dorsal surface of his right foot.  There is bruising at the dorsal aspect of the third and fourth MTP joints.  He is extremely tender to palpation on the third and fourth metatarsals.  He has pain with ambulation.  He has pain bearing weight.  There is bruising over the lateral aspect of his foot over the body of the fifth metatarsal.  There is significant tenderness on the plantar aspect of the midfoot.  Pain is intense to minimal palpation.  There is no erythema or warmth.  He also reports that the Advair we recently switched him to is not helping his breathing.  Previously he was on breztri which helps but was too expensive.  He would like to see if its cheaper now with his current insurance. Past Medical History:  Diagnosis Date   Arthritis    Bronchitis 10/27/2020   Cancer [facility])    adrenal cancer   Diabetes mellitus without complication ([place])    Diverticulitis 2006   s/p sigmoid colectomy, continues with scattered pancolinic diverticula   Hyperlipidemia    Hypertension    Neuropathy    RLS (restless legs syndrome)    Sleep apnea    use CPAP   Past Surgical History:  Procedure Laterality Date   BIOPSY  03/13/2020   Procedure: BIOPSY;  Surgeon: Daneil Dolin, MD;  [facility];  Service: Endoscopy;;   COLON SURGERY  2006   ruptured diverticulosis; sigmoid resection.    COLONOSCOPY  10/26/2004   Dr. Gala Romney; internal hemorrhoids, few scattered pancolonic diverticula, 3 left colon polyps.  Cannot locate path in chart.   COLONOSCOPY  03/19/2010   Friable anal canal, diminutive polyps about the sigmoid and anastomosis at 30 cm ablated and/or snare removed, multiple polyps from ileocecal valve biopsied/ ablated/or snared.  Residual scattered pancolonic diverticula.  Suspected rectal bleeding from anorectum in the setting of IBS.   Ileocecal pathology with tubular adenomas, otherwise hyperplastic polyps/benign small bowel mucosa. 7 yr repeat   COLONOSCOPY WITH PROPOFOL N/A 03/13/2020   Procedure: COLONOSCOPY WITH PROPOFOL;  Surgeon: Daneil Dolin, MD;  [facility];  Service: Endoscopy;  Laterality: N/A;  7:30am   ESOPHAGOGASTRODUODENOSCOPY (EGD) WITH PROPOFOL N/A 03/13/2020   Procedure: ESOPHAGOGASTRODUODENOSCOPY (EGD) WITH PROPOFOL;  Surgeon: Daneil Dolin, MD;  [facility];  Service: Endoscopy;  Laterality: N/A;   KNEE ARTHROSCOPY WITH MEDIAL MENISECTOMY Right 06/12/2017   Procedure: KNEE ARTHROSCOPY WITH PARTIAL MEDIAL MENISECTOMY;  Surgeon: [name], MD;  [facility];  Service: Orthopedics;  Laterality: Right;   MALONEY DILATION N/A 03/13/2020   Procedure: Venia Minks DILATION;  Surgeon: Daneil Dolin, MD;  [facility];  Service: Endoscopy;  Laterality: N/A;   POLYPECTOMY  03/13/2020   Procedure: POLYPECTOMY;  Surgeon: Daneil Dolin, MD;  [facility];  Service: Endoscopy;;   RESECTION OF ABDOMINAL MASS     adrenal mass right (cancer)   TOTAL KNEE ARTHROPLASTY Right 07/13/2018   Procedure: RIGHT TOTAL KNEE ARTHROPLASTY,INJECTION OF LEFT KNEE;  Surgeon: Gaynelle Arabian, MD;  [facility];  Service: Orthopedics;  Laterality: Right;  37mn   Current Outpatient Medications on File Prior to Visit  Medication Sig Dispense Refill   albuterol (PROVENTIL) (2.5 MG/3ML) 0.083% nebulizer solution Take 3 mLs (2.5 mg total) by  nebulization every 6 (six) hours as needed for wheezing or shortness of breath. Use this medication OR rescue inhaler every 6 hours as needed.  Not both. 150 mL 3   albuterol (VENTOLIN HFA) 108 (90 Base) MCG/ACT inhaler Inhale 2 puffs into the lungs every 6 (six) hours as needed for wheezing or shortness of breath. 1 each 3   atorvastatin (LIPITOR) 10 MG tablet TAKE (1) TABLET BY MOUTH AT BEDTIME. 90 tablet 3   benazepril (LOTENSIN) 40 MG tablet  TAKE 1 TABLET BY MOUTH ONCE DAILY. 90 tablet 0   DULoxetine (CYMBALTA) 60 MG capsule TAKE ONE CAPSULE BY MOUTH ONCE DAILY. 90 capsule 0   fluticasone (FLONASE) 50 MCG/ACT nasal spray Place 2 sprays into both nostrils daily. 16 g 6   fluticasone-salmeterol (ADVAIR HFA) 230-21 MCG/ACT inhaler Inhale 2 puffs into the lungs 2 (two) times daily. 1 each 12   gabapentin (NEURONTIN) 300 MG capsule TAKE 2 CAPSULES BY MOUTH THREE TIMES A DAY. 540 capsule 0   hydrochlorothiazide (HYDRODIURIL) 25 MG tablet TAKE (1) TABLET BY MOUTH ONCE DAILY. 90 tablet 0   metFORMIN (GLUCOPHAGE) 1000 MG tablet TAKE (1) TABLET BY MOUTH TWICE A DAY WITH A MEAL. 180 tablet 0   pantoprazole (PROTONIX) 40 MG tablet TAKE (1) TABLET BY MOUTH ONCE DAILY. 90 tablet 3   rOPINIRole (REQUIP) 1 MG tablet TAKE (2) TABLETS BY MOUTH AT BEDTIME. 180 tablet 0   No current facility-administered medications on file prior to visit.   No Known Allergies Social History   Socioeconomic History   Marital status: Married    Spouse name: Not on file   Number of children: Not on file   Years of education: Not on file   Highest education level: Not on file  Occupational History   Not on file  Tobacco Use   Smoking status: Every Day    Packs/day: 2.00    Years: 40.00    Pack years: 80.00    Types: Cigarettes   Smokeless tobacco: Never  Vaping Use   Vaping Use: Never used  Substance and Sexual Activity   Alcohol use: Yes    Comment: once a month   Drug use: No   Sexual activity: Yes  Other Topics Concern   Not on file  Social History Narrative   Not on file   Social Determinants of Health   Financial Resource Strain: Not on file  Food Insecurity: Not on file  Transportation Needs: Not on file  Physical Activity: Not on file  Stress: Not on file  Social Connections: Not on file  Intimate Partner Violence: Not on file      Review of Systems  All other systems reviewed and are negative.     Objective:   Physical  Exam Vitals reviewed.  Constitutional:      General: He is not in acute distress.    Appearance: He is well-developed. He is not diaphoretic.  HENT:     Head: Normocephalic and atraumatic.     Right Ear: External ear normal.     Left Ear: External ear normal.     Nose: Nose normal.     Mouth/Throat:     Pharynx: No oropharyngeal exudate.  Eyes:     General: No scleral icterus.       Right eye: No discharge.        Left eye: No discharge.     Conjunctiva/sclera: Conjunctivae normal.     Pupils: Pupils are equal, round, and reactive to  light.  Neck:     Thyroid: No thyromegaly.     Vascular: No JVD.     Trachea: No tracheal deviation.  Cardiovascular:     Rate and Rhythm: Normal rate and regular rhythm.     Pulses:          Dorsalis pedis pulses are 2+ on the right side.       Posterior tibial pulses are 2+ on the right side.     Heart sounds: Normal heart sounds. No murmur heard.   No friction rub. No gallop.  Pulmonary:     Effort: Pulmonary effort is normal. No respiratory distress.     Breath sounds: Normal breath sounds. No stridor. No wheezing or rales.  Chest:     Chest wall: No tenderness.  Abdominal:     General: Bowel sounds are normal. There is no distension.     Palpations: Abdomen is soft. There is no mass.     Tenderness: There is no abdominal tenderness. There is no guarding or rebound.  Musculoskeletal:        General: No tenderness.     Cervical back: Normal range of motion and neck supple.     Right foot: Decreased range of motion. Deformity present.       Feet:  Feet:     Right foot:     Skin integrity: No skin breakdown, erythema or warmth.  Lymphadenopathy:     Cervical: No cervical adenopathy.  Skin:    General: Skin is warm.     Coloration: Skin is not pale.     Findings: No erythema or rash.  Neurological:     Mental Status: He is alert and oriented to person, place, and time.     Cranial Nerves: No cranial nerve deficit.     Motor: No  abnormal muscle tone.     Coordination: Coordination normal.     Deep Tendon Reflexes: Reflexes are normal and symmetric.  Psychiatric:        Behavior: Behavior normal.        Thought Content: Thought content normal.        Judgment: Judgment normal.          Assessment & Plan:  Foot pain, right - Plan: DG Foot Complete Right Plan x-ray of the right foot to evaluate for any fractures of the third and fourth metatarsals.  There is a fracture I would recommend a cam walker until healed.  However I suspect that he is likely to sprained his midfoot.  I did send in a new prescription for breztri because the patient feels that the Advair is not helping

## 2021-12-25 NOTE — Telephone Encounter (Signed)
Pt called in stating that he believe he has broken two toes on his foot. Pt states that the toes are numb, bleeding, and he thinks leaking pus. Pt states that he is a diabetic and is concerned about his toes. Please advise   Cb#: [phone]    Chief Complaint: Toe Injury Symptoms: [place] and injured 2 toes right foot, bruised toe on left foot. Right foot swollen, open areas were bleeding, not presently, "Pus like drainage.  Frequency: Friday Pertinent Negatives: Patient denies fever Disposition: '[]'$ ED /'[]'$ Urgent Care (no appt availability in office) / '[x]'$ Appointment(In office/virtual)/ '[]'$  [place] [facility]/ '[]'$ Home Care/ '[]'$ Refused Recommended Disposition /'[]'$ [place] [facility]/ '[]'$  Follow-up with PCP Additional Notes: Appt secured for today. Pt is diabetic. Care advise provided, verbalizes understanding. Tdap 04/22/2016   Reason for Disposition  [1] Toe injury AND [2] bad limp or can't wear shoes/sandals  Answer Assessment - Initial Assessment Questions 1. MECHANISM: "How did the injury happen?"      Fell 2. ONSET: "When did the injury happen?" (Minutes or hours ago)      Friday 3. LOCATION: "What part of the toe is injured?" "Is the nail damaged?"      Right foot, last two toes. Left ,bruised, one toe only 4. APPEARANCE of TOE INJURY: "What does the injury look like?"      Bruised, open wound,  5. SEVERITY: "Can you use the foot normally?" "Can you walk?"      Painful 6. SIZE: For cuts, bruises, or swelling, ask: "How large is it?" (e.g., inches or centimeters;  entire toe)      Unsure, can't see, under foot 7. PAIN: "Is there pain?" If Yes, ask: "How bad is the pain?"   (e.g., Scale 1-10; or mild, moderate, severe)     Painful and swelling 8. TETANUS: For any breaks in the skin, ask: "When was the last tetanus booster?"     Unsure 9. DIABETES: "Do you have a history of diabetes or poor circulation in the feet?"     Yes 10. OTHER SYMPTOMS: "Do you have any other symptoms?"         No  Protocols used: Toe Injury-A-AH

## 2021-12-27 ENCOUNTER — Other Ambulatory Visit: Payer: Self-pay | Admitting: Family Medicine

## 2021-12-28 ENCOUNTER — Telehealth: Payer: Self-pay

## 2021-12-28 NOTE — Telephone Encounter (Signed)
Pt called in to inquire about getting his prescription filled for this med DULoxetine (CYMBALTA) 60 MG capsule. Pt states that he just had an ov and does not know why he would need to schedule another ov to get this refilled. Please advise.  Cb#: [phone]

## 2021-12-28 NOTE — Telephone Encounter (Signed)
Patient called, left VM to return the call to the office. If he returns the call, let him know the reason for the appointment is that the refill protocol indicates a every 6 month visit, so the nurse who refilled is just following the protocols. A 90 day refill was sent to the pharmacy on 12/28/21, so he can schedule anytime to receive additional refills. If he refuses, route this to the provider.

## 2021-12-28 NOTE — Telephone Encounter (Signed)
Requested medication (s) are due for refill today: yes  Requested medication (s) are on the active medication list: yes  Last refill:  09/10/21 #90  Future visit scheduled: called pt and asked pt to call office to schedule OV for refill  Notes to clinic:  last OV that addresses 06/10/19   Requested Prescriptions  Pending Prescriptions Disp Refills   DULoxetine (CYMBALTA) 60 MG capsule [Pharmacy Med Name: DULOXETINE HCL DR 60 MG CAP] 90 capsule 0    Sig: TAKE ONE CAPSULE BY MOUTH ONCE DAILY.     Psychiatry: Antidepressants - SNRI - duloxetine Failed - 12/27/2021 10:40 AM      Failed - Cr in normal range and within 360 days    Creat  Date Value Ref Range Status  06/01/2021 1.47 (H) 0.70 - 1.35 mg/dL Final   Creatinine, Ser  Date Value Ref Range Status  08/16/2021 1.56 (H) 0.61 - 1.24 mg/dL Final   Creatinine, Urine  Date Value Ref Range Status  06/01/2021 144 20 - 320 mg/dL Final         Failed - Completed PHQ-2 or PHQ-9 in the last 360 days      Failed - Last BP in normal range    BP Readings from Last 1 Encounters:  12/25/21 (!) 142/96         Passed - eGFR is 30 or above and within 360 days    GFR, Est African American  Date Value Ref Range Status  04/07/2020 71 > OR = 60 mL/min/1.19m Final   GFR, Est Non African American  Date Value Ref Range Status  04/07/2020 61 > OR = 60 mL/min/1.754mFinal   GFR, Estimated  Date Value Ref Range Status  08/16/2021 50 (L) >60 mL/min Final    Comment:    (NOTE) Calculated using the CKD-EPI Creatinine Equation (2021)    eGFR  Date Value Ref Range Status  06/01/2021 54 (L) > OR = 60 mL/min/1.7375minal    Comment:    The eGFR is based on the CKD-EPI 2021 equation. To calculate  the new eGFR from a previous Creatinine or Cystatin C result, go to https://www.kidney.org/professionals/ kdoqi/gfr%5Fcalculator          Passed - Valid encounter within last 6 months    Recent Outpatient Visits           3 days ago Foot  pain, right   [name]   2 months ago Pulmonary emphysema, unspecified [place]ysema type [place] [name]   4 months ago Current every day smoker   [name]   10 months ago Acute pain of left knee   [name]   1 year ago [name]

## 2022-02-11 ENCOUNTER — Other Ambulatory Visit: Payer: Self-pay | Admitting: Family Medicine

## 2022-03-18 ENCOUNTER — Ambulatory Visit (INDEPENDENT_AMBULATORY_CARE_PROVIDER_SITE_OTHER): Payer: Managed Care, Other (non HMO) | Admitting: Family Medicine

## 2022-03-18 VITALS — BP 136/80 | HR 97 | Temp 98.8°F | Ht 69.0 in | Wt 254.4 lb

## 2022-03-18 DIAGNOSIS — J209 Acute bronchitis, unspecified: Secondary | ICD-10-CM

## 2022-03-18 DIAGNOSIS — F17200 Nicotine dependence, unspecified, uncomplicated: Secondary | ICD-10-CM | POA: Diagnosis not present

## 2022-03-18 DIAGNOSIS — R0609 Other forms of dyspnea: Secondary | ICD-10-CM | POA: Diagnosis not present

## 2022-03-18 DIAGNOSIS — J440 Chronic obstructive pulmonary disease with acute lower respiratory infection: Secondary | ICD-10-CM

## 2022-03-18 MED ORDER — PREDNISONE 20 MG PO TABS: ORAL_TABLET | ORAL | 0 refills | Status: DC

## 2022-03-18 NOTE — Progress Notes (Signed)
Subjective:    Patient ID: Jason French, male    DOB: 01-23-1961, 60 y.o.   MRN: [id number] 08/21/21 Patient had Grant right after Christmas.  Patient reports shortness of breath and coughing.  He states that sometimes he coughs so hard that he sees stars and he feels like he cannot pass out.  He reports shortness of breath with activity.  He denies any chest pain.  He denies any hemoptysis.  He denies any pleurisy.  He recently went to the emergency room.  Chest x-ray showed bibasilar atelectasis.  D-dimer was elevated so they did a CT angiogram that showed no pulmonary embolism.  It did show questionable findings of nonalcoholic steatohepatitis and cirrhosis.  He has known chronic kidney disease.  Continues to smoke.  States that he was smoking up to 2-1/2 packs of cigarettes a day.  He is trying to cut back some.  At that time, my plan was:  She has not on any maintenance therapy.  He has never had formal pulmonary function test.  I would like to try the patient empirically on Breztri 2 inhalations twice daily.  I also spent a large amount of time explaining the importance of smoking cessation.  I believe that the smoking slowly killing him.  I believe that the [place] only highlighted the underlying lung damage due to the emphysema that he has.  Therefore I would like to see how he responds to the 1 month of samples that I gave him of breztri and I emphasized the need for smoking cessation.  If shortness of breath is not improving, consider a stress test along with a formal referral to pulmonology for pulmonary function test  10/12/21 Patient states that his breathing is about 50% better on the breztri.  He is sleeping better at night.  His shortness of breath has improved.  He denies any chest pain or pleurisy.  He continues to have to use his rescue albuterol sporadically once or twice a week however his breathing is definitely improved compared to 2 months ago.  He denies any hemoptysis or angina.   He denies any orthopnea.  However his insurance is going to charge him $600 for breztri.   03/18/22 Patient states that he woke up this morning coughing and having a difficult time breathing.  He states that he was coughing up yellow phlegm.  He denies any fevers or chills.  He was having some tightness in his back felt like he could not catch his breath.  He denies any chest pain or substernal chest pressure.  He states that he is breathing better now although he continues to cough.  On examination he has distant breath sounds in all 4 lung fields faint expiratory wheezing.  There are no crackles or rails or pitting edema in his extremities.  He is in normal sinus rhythm. Past Medical History:  Diagnosis Date   Arthritis    Bronchitis 10/27/2020   Cancer [facility])    adrenal cancer   Diabetes mellitus without complication ([place])    Diverticulitis 2006   s/p sigmoid colectomy, continues with scattered pancolinic diverticula   Hyperlipidemia    Hypertension    Neuropathy    RLS (restless legs syndrome)    Sleep apnea    use CPAP   Past Surgical History:  Procedure Laterality Date   BIOPSY  03/13/2020   Procedure: BIOPSY;  Surgeon: Daneil Dolin, MD;  [facility];  Service: Endoscopy;;   COLON SURGERY  2006   ruptured diverticulosis; sigmoid resection.    COLONOSCOPY  10/26/2004   Dr. Gala Romney; internal hemorrhoids, few scattered pancolonic diverticula, 3 left colon polyps.  Cannot locate path in chart.   COLONOSCOPY  03/19/2010   Friable anal canal, diminutive polyps about the sigmoid and anastomosis at 30 cm ablated and/or snare removed, multiple polyps from ileocecal valve biopsied/ ablated/or snared.  Residual scattered pancolonic diverticula.  Suspected rectal bleeding from anorectum in the setting of IBS.  Ileocecal pathology with tubular adenomas, otherwise hyperplastic polyps/benign small bowel mucosa. 7 yr repeat   COLONOSCOPY WITH PROPOFOL N/A 03/13/2020   Procedure:  COLONOSCOPY WITH PROPOFOL;  Surgeon: Daneil Dolin, MD;  [facility];  Service: Endoscopy;  Laterality: N/A;  7:30am   ESOPHAGOGASTRODUODENOSCOPY (EGD) WITH PROPOFOL N/A 03/13/2020   Procedure: ESOPHAGOGASTRODUODENOSCOPY (EGD) WITH PROPOFOL;  Surgeon: Daneil Dolin, MD;  [facility];  Service: Endoscopy;  Laterality: N/A;   KNEE ARTHROSCOPY WITH MEDIAL MENISECTOMY Right 06/12/2017   Procedure: KNEE ARTHROSCOPY WITH PARTIAL MEDIAL MENISECTOMY;  Surgeon: [name], MD;  Location: [facility] ORS;  Service: Orthopedics;  Laterality: Right;   MALONEY DILATION N/A 03/13/2020   Procedure: Venia Minks DILATION;  Surgeon: Daneil Dolin, MD;  [facility];  Service: Endoscopy;  Laterality: N/A;   POLYPECTOMY  03/13/2020   Procedure: POLYPECTOMY;  Surgeon: Daneil Dolin, MD;  [facility];  Service: Endoscopy;;   RESECTION OF ABDOMINAL MASS     adrenal mass right (cancer)   TOTAL KNEE ARTHROPLASTY Right 07/13/2018   Procedure: RIGHT TOTAL KNEE ARTHROPLASTY,INJECTION OF LEFT KNEE;  Surgeon: Gaynelle Arabian, MD;  [facility];  Service: Orthopedics;  Laterality: Right;  62mn   Current Outpatient Medications on File Prior to Visit  Medication Sig Dispense Refill   albuterol (PROVENTIL) (2.5 MG/3ML) 0.083% nebulizer solution Take 3 mLs (2.5 mg total) by nebulization every 6 (six) hours as needed for wheezing or shortness of breath. Use this medication OR rescue inhaler every 6 hours as needed.  Not both. 150 mL 3   albuterol (VENTOLIN HFA) 108 (90 Base) MCG/ACT inhaler Inhale 2 puffs into the lungs every 6 (six) hours as needed for wheezing or shortness of breath. 1 each 3   atorvastatin (LIPITOR) 10 MG tablet TAKE (1) TABLET BY MOUTH AT BEDTIME. 90 tablet 3   benazepril (LOTENSIN) 40 MG tablet TAKE 1 TABLET BY MOUTH ONCE DAILY. 90 tablet 0   DULoxetine (CYMBALTA) 60 MG capsule TAKE ONE CAPSULE BY MOUTH ONCE DAILY. 90 capsule 0   fluticasone (FLONASE) 50  MCG/ACT nasal spray Place 2 sprays into both nostrils daily. 16 g 6   gabapentin (NEURONTIN) 300 MG capsule TAKE 2 CAPSULES BY MOUTH THREE TIMES A DAY. 540 capsule 0   hydrochlorothiazide (HYDRODIURIL) 25 MG tablet TAKE (1) TABLET BY MOUTH ONCE DAILY. 90 tablet 0   HYDROcodone-acetaminophen (NORCO) 5-325 MG tablet Take 1 tablet by mouth every 6 (six) hours as needed for moderate pain. 30 tablet 0   metFORMIN (GLUCOPHAGE) 1000 MG tablet TAKE (1) TABLET BY MOUTH TWICE A DAY WITH A MEAL. 180 tablet 0   pantoprazole (PROTONIX) 40 MG tablet TAKE (1) TABLET BY MOUTH ONCE DAILY. 90 tablet 3   rOPINIRole (REQUIP) 1 MG tablet TAKE (2) TABLETS BY MOUTH AT BEDTIME. 180 tablet 0   Budeson-Glycopyrrol-Formoterol (BREZTRI AEROSPHERE) 160-9-4.8 MCG/ACT AERO Inhale 2 puffs into the lungs 2 (two) times daily. (Patient not taking: Reported on 03/18/2022) 10.7 g 11  No current facility-administered medications on file prior to visit.   No Known Allergies Social History   Socioeconomic History   Marital status: Married    Spouse name: Not on file   Number of children: Not on file   Years of education: Not on file   Highest education level: Not on file  Occupational History   Not on file  Tobacco Use   Smoking status: Every Day    Packs/day: 2.00    Years: 40.00    Total pack years: 80.00    Types: Cigarettes   Smokeless tobacco: Never  Vaping Use   Vaping Use: Never used  Substance and Sexual Activity   Alcohol use: Yes    Comment: once a month   Drug use: No   Sexual activity: Yes  Other Topics Concern   Not on file  Social History Narrative   Not on file   Social Determinants of Health   Financial Resource Strain: Not on file  Food Insecurity: Not on file  Transportation Needs: Not on file  Physical Activity: Not on file  Stress: Not on file  Social Connections: Not on file  Intimate Partner Violence: Not on file      Review of Systems  All other systems reviewed and are  negative.      Objective:   Physical Exam Vitals reviewed.  Constitutional:      General: He is not in acute distress.    Appearance: He is well-developed. He is not diaphoretic.  HENT:     Head: Normocephalic and atraumatic.     Right Ear: External ear normal.     Left Ear: External ear normal.     Nose: Nose normal.     Mouth/Throat:     Pharynx: No oropharyngeal exudate.  Eyes:     General: No scleral icterus.       Right eye: No discharge.        Left eye: No discharge.     Conjunctiva/sclera: Conjunctivae normal.     Pupils: Pupils are equal, round, and reactive to light.  Neck:     Thyroid: No thyromegaly.     Vascular: No JVD.     Trachea: No tracheal deviation.  Cardiovascular:     Rate and Rhythm: Normal rate and regular rhythm.     Heart sounds: Normal heart sounds. No murmur heard.    No friction rub. No gallop.  Pulmonary:     Effort: Pulmonary effort is normal. No respiratory distress.     Breath sounds: No stridor. Examination of the right-upper field reveals decreased breath sounds. Examination of the left-upper field reveals decreased breath sounds. Examination of the right-middle field reveals decreased breath sounds. Examination of the left-middle field reveals decreased breath sounds. Examination of the right-lower field reveals decreased breath sounds. Examination of the left-lower field reveals decreased breath sounds. Decreased breath sounds present. No wheezing, rhonchi or rales.  Chest:     Chest wall: No tenderness.  Abdominal:     General: Bowel sounds are normal. There is no distension.     Palpations: Abdomen is soft. There is no mass.     Tenderness: There is no abdominal tenderness. There is no guarding or rebound.  Musculoskeletal:        General: No tenderness or deformity. Normal range of motion.     Cervical back: Normal range of motion and neck supple.  Lymphadenopathy:     Cervical: No cervical adenopathy.  Skin:  General: Skin is  warm.     Coloration: Skin is not pale.     Findings: No erythema or rash.  Neurological:     Mental Status: He is alert and oriented to person, place, and time.     Cranial Nerves: No cranial nerve deficit.     Motor: No abnormal muscle tone.     Coordination: Coordination normal.     Deep Tendon Reflexes: Reflexes are normal and symmetric.  Psychiatric:        Behavior: Behavior normal.        Thought Content: Thought content normal.        Judgment: Judgment normal.           Assessment & Plan:  Current every day smoker  Acute bronchitis with COPD ([place]) Patient is compliant with his Breztri.  I recommended that he use albuterol 2 puffs every 6 hours as needed and we will start him on a prednisone taper pack for what I believe is essentially like an asthma attack brought on by heat humidity smoking.  I do not appreciate any signs of pneumonia or infection or pulmonary edema.  Patient received a DuoNeb in clinic today his breathing improved after he received a breathing treatment.    I do think the patient would benefit from a stress test as he frequently complains of shortness of breath and has risk factors including hypertension diabetes and smoking.  Therefore I will refer him to cardiology as an outpatient for stress test.  However I feel that his shortness of breath today is primarily due to his underlying emphysema/COPD

## 2022-03-19 ENCOUNTER — Encounter: Payer: Self-pay | Admitting: Family Medicine

## 2022-03-26 ENCOUNTER — Other Ambulatory Visit: Payer: Self-pay | Admitting: Family Medicine

## 2022-03-26 DIAGNOSIS — G629 Polyneuropathy, unspecified: Secondary | ICD-10-CM

## 2022-03-27 NOTE — Telephone Encounter (Signed)
Requested Prescriptions  Pending Prescriptions Disp Refills  . gabapentin (NEURONTIN) 300 MG capsule [Pharmacy Med Name: GABAPENTIN 300 MG CAPSULE] 540 capsule 0    Sig: TAKE 2 CAPSULES BY MOUTH THREE TIMES A DAY.     Neurology: Anticonvulsants - gabapentin Failed - 03/26/2022  4:23 PM      Failed - Cr in normal range and within 360 days    Creat  Date Value Ref Range Status  06/01/2021 1.47 (H) 0.70 - 1.35 mg/dL Final   Creatinine, Ser  Date Value Ref Range Status  08/16/2021 1.56 (H) 0.61 - 1.24 mg/dL Final   Creatinine, Urine  Date Value Ref Range Status  06/01/2021 144 20 - 320 mg/dL Final         Passed - Completed PHQ-2 or PHQ-9 in the last 360 days      Passed - Valid encounter within last 12 months    Recent Outpatient Visits          3 months ago Foot pain, right   [facility] [name], Warren T, MD   5 months ago Pulmonary emphysema, unspecified emphysema type (HCC)   [facility] [name], Warren T, MD   7 months ago Current every day smoker   [facility] [name], Warren T, MD   1 year ago Acute pain of left knee   [facility] [name], Warren T, MD   1 year ago Other cirrhosis of liver (HCC)   [facility] [name], Warren T, MD      Future Appointments            In 3 weeks Turner, Traci R, MD [facility] [place]           . DULoxetine (CYMBALTA) 60 MG capsule [Pharmacy Med Name: DULOXETINE HCL DR 60 MG CAP] 90 capsule 0    Sig: TAKE ONE CAPSULE BY MOUTH ONCE DAILY.     Psychiatry: Antidepressants - SNRI - duloxetine Failed - 03/26/2022  4:23 PM      Failed - Cr in normal range and within 360 days    Creat  Date Value Ref Range Status  06/01/2021 1.47 (H) 0.70 - 1.35 mg/dL Final   Creatinine, Ser  Date Value Ref Range Status  08/16/2021 1.56 (H) 0.61 - 1.24 mg/dL Final   Creatinine, Urine  Date Value Ref  Range Status  06/01/2021 144 20 - 320 mg/dL Final         Passed - eGFR is 30 or above and within 360 days    GFR, Est African American  Date Value Ref Range Status  04/07/2020 71 > OR = 60 mL/min/1.73m2 Final   GFR, Est Non African American  Date Value Ref Range Status  04/07/2020 61 > OR = 60 mL/min/1.73m2 Final   GFR, Estimated  Date Value Ref Range Status  08/16/2021 50 (L) >60 mL/min Final    Comment:    (NOTE) Calculated using the CKD-EPI Creatinine Equation (2021)    eGFR  Date Value Ref Range Status  06/01/2021 54 (L) > OR = 60 mL/min/1.73m2 Final    Comment:    The eGFR is based on the CKD-EPI 2021 equation. To calculate  the new eGFR from a previous Creatinine or Cystatin C result, go to https://www.kidney.org/professionals/ kdoqi/gfr%5Fcalculator          Passed - Completed PHQ-2 or PHQ-9 in the last 360 days        Passed - Last BP in normal range    BP Readings from Last 1 Encounters:  03/18/22 136/80         Passed - Valid encounter within last 6 months    Recent Outpatient Visits          3 months ago Foot pain, right   [place] [facility], [name], MD   5 months ago Pulmonary emphysema, unspecified emphysema type ([place])   [place] Susy Frizzle, MD   7 months ago Current every day smoker   [place] [name], [name], MD   1 year ago Acute pain of left knee   [name], [name], MD   1 year ago Other cirrhosis of liver [facility])   [facility] [name], [name], MD      Future Appointments            In 3 weeks Turner, Eber Hong, MD [place]. [facility]

## 2022-04-17 ENCOUNTER — Ambulatory Visit: Payer: Managed Care, Other (non HMO) | Admitting: Cardiology

## 2022-04-24 ENCOUNTER — Other Ambulatory Visit: Payer: Managed Care, Other (non HMO)

## 2022-05-22 ENCOUNTER — Other Ambulatory Visit: Payer: Self-pay | Admitting: Family Medicine

## 2022-06-14 ENCOUNTER — Other Ambulatory Visit: Payer: Self-pay | Admitting: Family Medicine

## 2022-07-05 IMAGING — DX DG CHEST 2V
2 series · 2 of 2 positions shown · non-contrast
Comparison: 05/14/2018

CLINICAL DATA: 60-year-old male with persistent cough

EXAM:
CHEST - 2 VIEW

[chest pa]
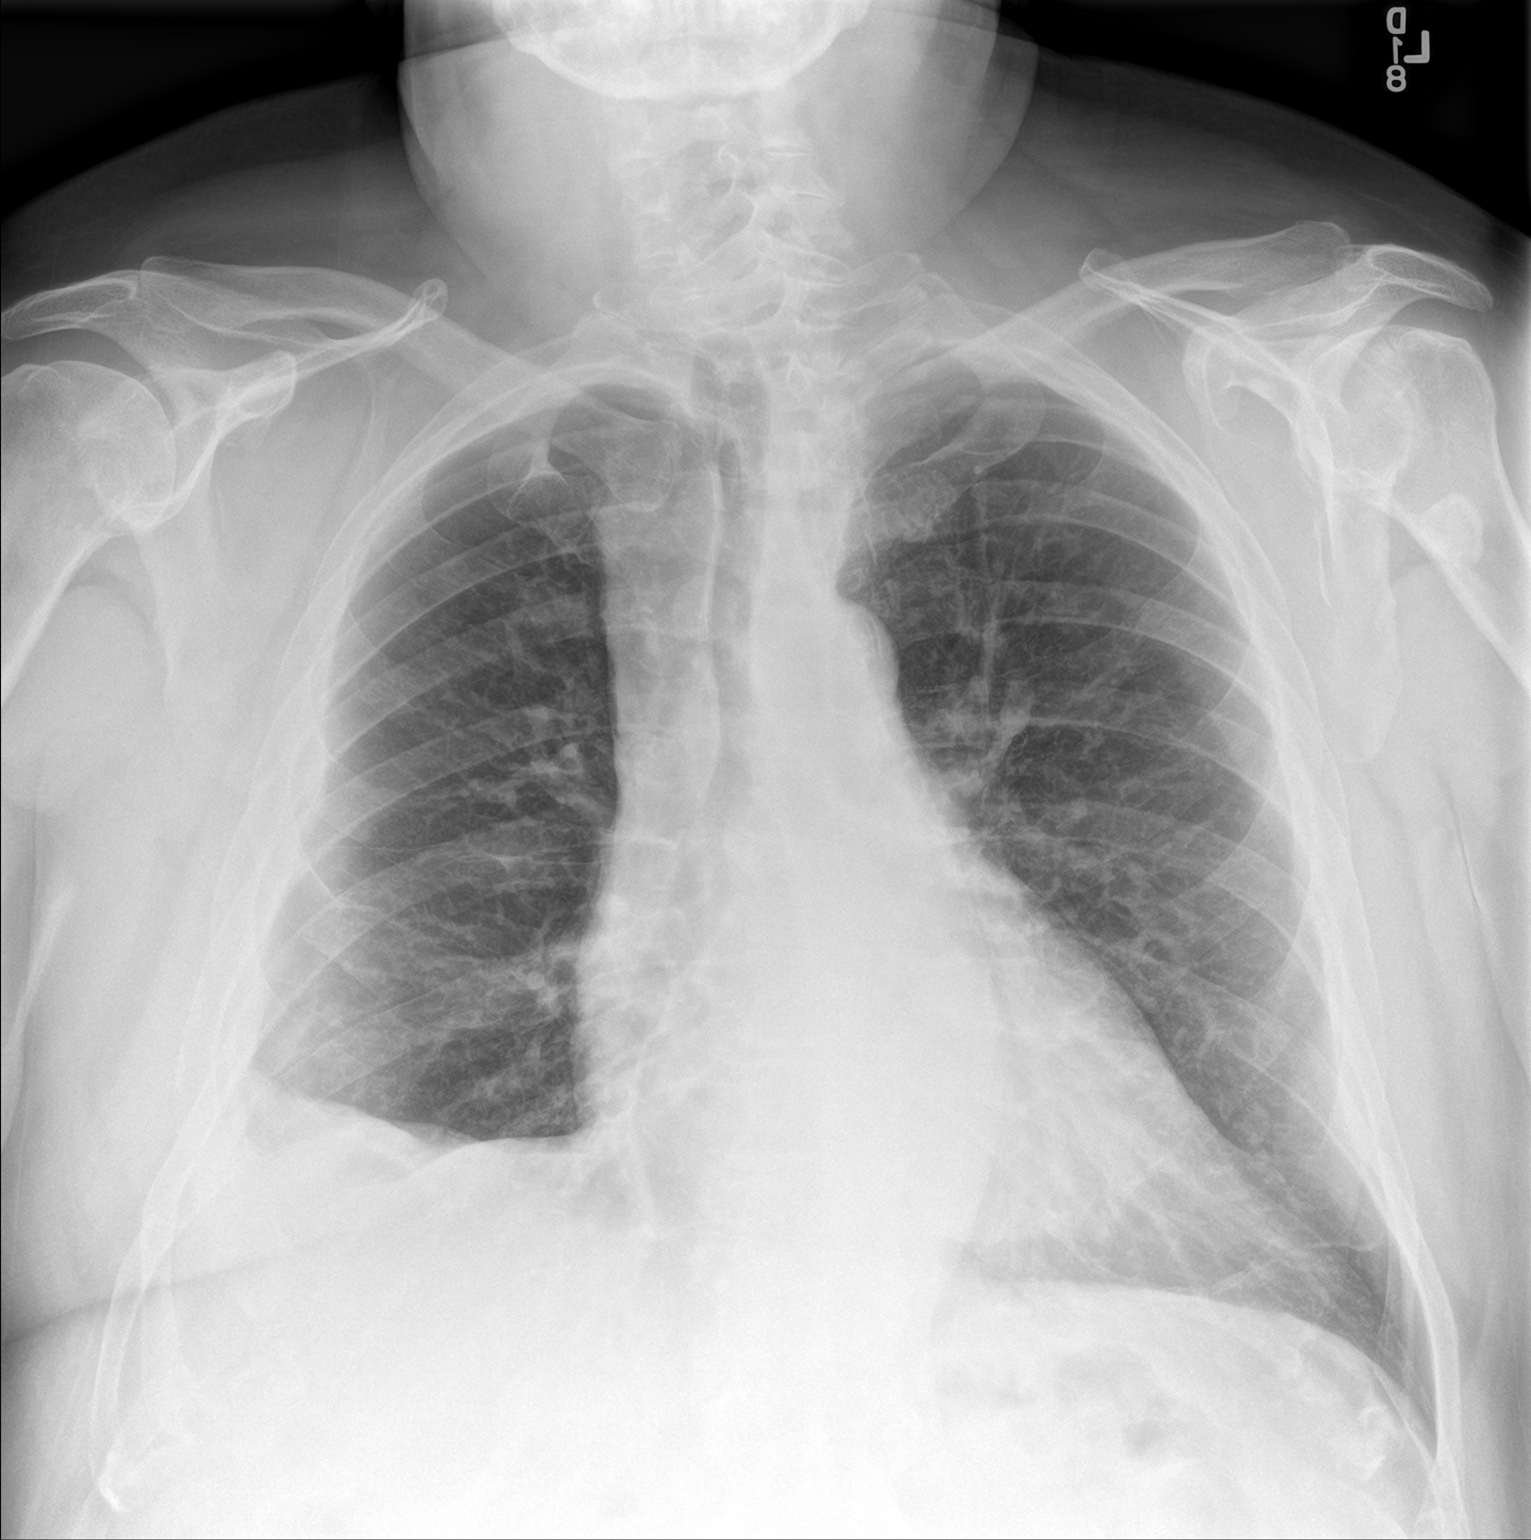

[chest lat]
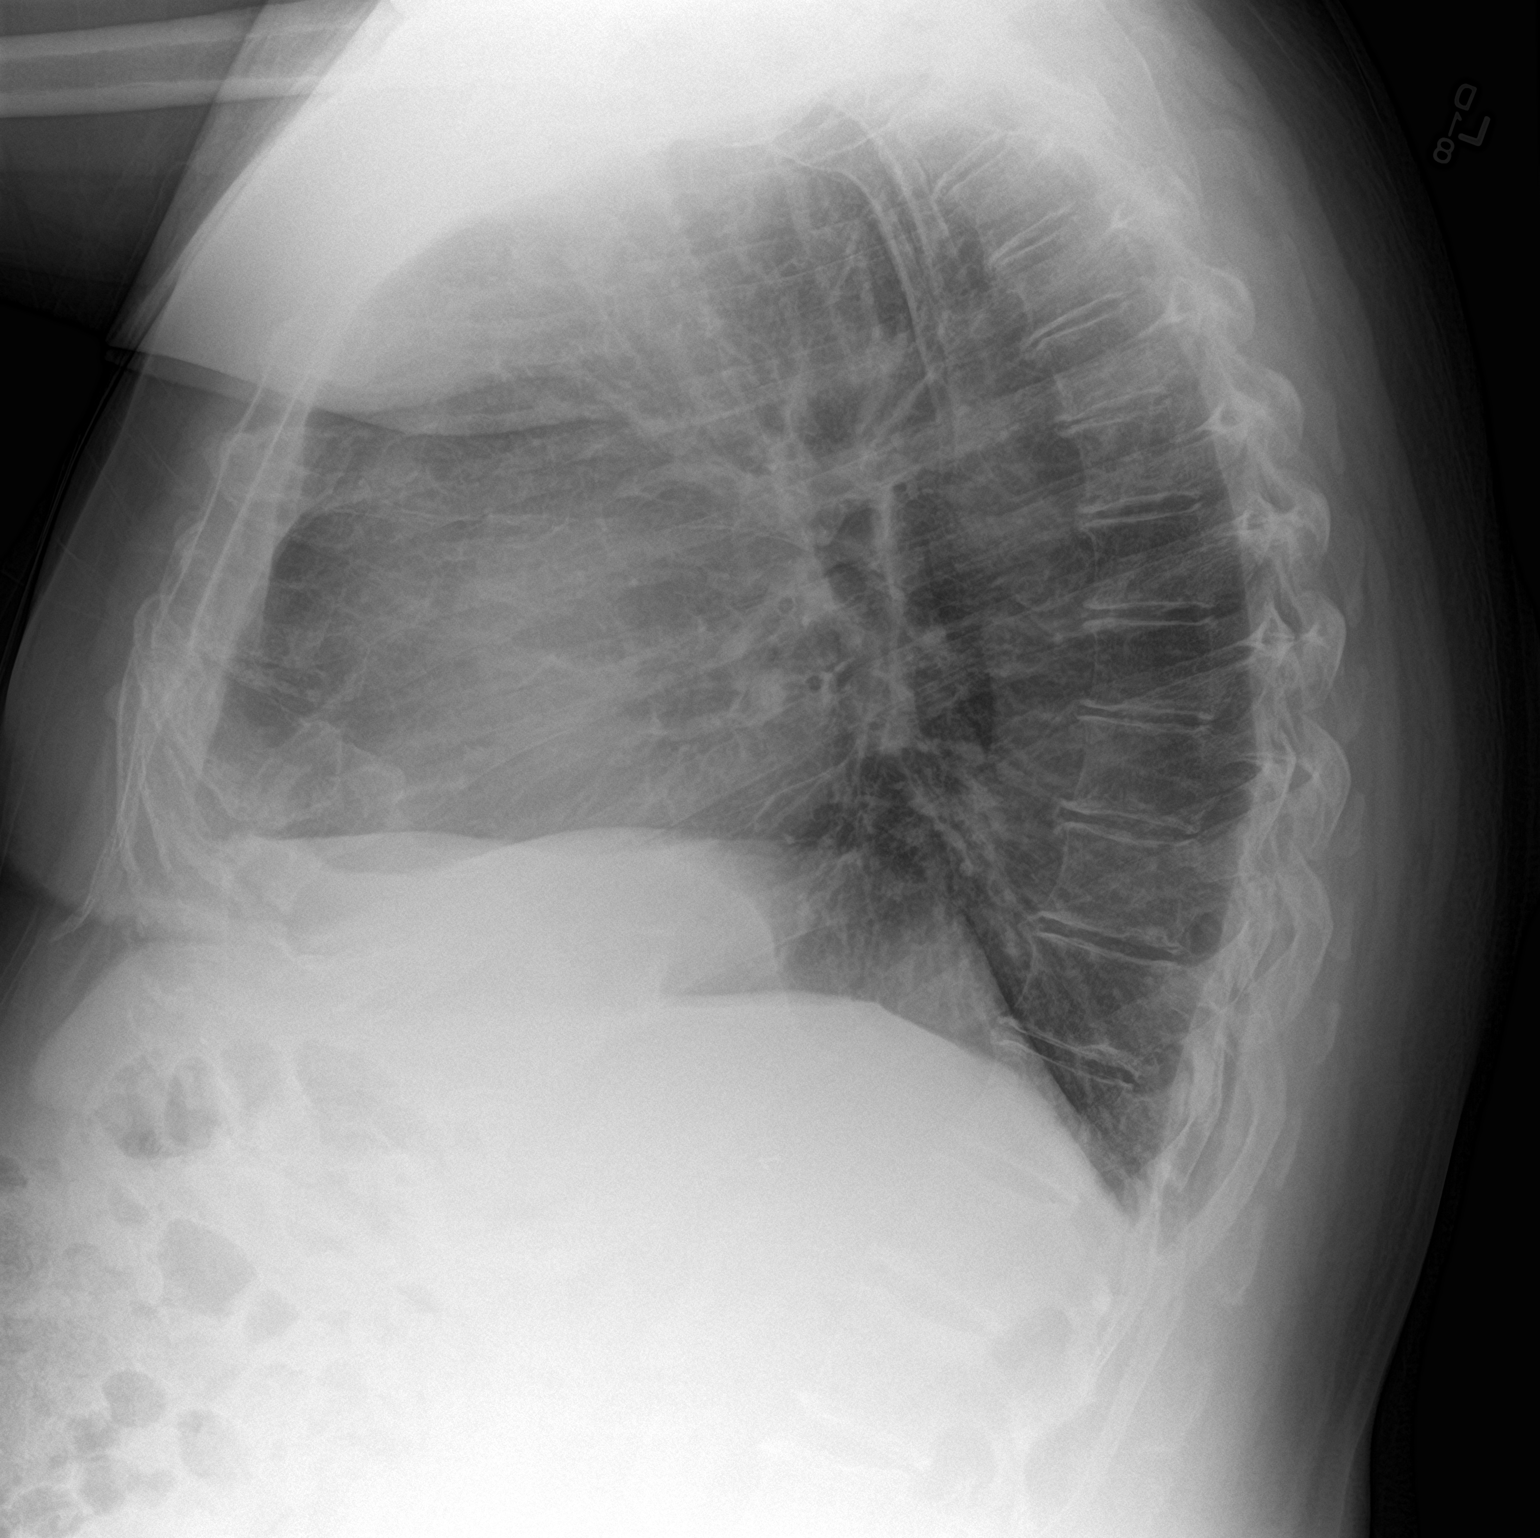

[2 of 2 positions shown; findings below may reference images not displayed]

FINDINGS: Cardiomediastinal silhouette unchanged in size and contour. No
pneumothorax. No interlobular septal thickening or central vascular
congestion.

Similar appearance of asymmetric elevation the right hemidiaphragm
with blunting at the right costophrenic angle. No pleural effusion.

No confluent airspace disease with coarsened interstitial markings
persisting.
IMPRESSION: Chronic changes without evidence of acute cardiopulmonary disease

## 2022-07-09 ENCOUNTER — Telehealth: Payer: Self-pay

## 2022-07-09 NOTE — Telephone Encounter (Signed)
Pt called in asking if pcp would provide a letter for pt stating that his neuropathy does not impact his driving. 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 Pt would like to know if this letter can be available tomorrow (07/10/22). Please advise.  Cb#: [phone]

## 2022-07-12 ENCOUNTER — Encounter: Payer: Self-pay | Admitting: Family Medicine

## 2022-07-15 ENCOUNTER — Other Ambulatory Visit: Payer: Self-pay | Admitting: Family Medicine

## 2022-07-15 ENCOUNTER — Other Ambulatory Visit: Payer: Self-pay | Admitting: Gastroenterology

## 2022-07-15 DIAGNOSIS — G629 Polyneuropathy, unspecified: Secondary | ICD-10-CM

## 2022-07-15 NOTE — Telephone Encounter (Signed)
PT NEED CPE APPT W/PCP FOR FUTURE REFILLS  

## 2022-07-17 ENCOUNTER — Telehealth: Payer: Self-pay | Admitting: Family Medicine

## 2022-07-17 NOTE — Telephone Encounter (Signed)
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 The letter he just picked up is too vague.  Please advise when letter is ready for pickup at [phone].

## 2022-08-28 ENCOUNTER — Other Ambulatory Visit: Payer: Self-pay | Admitting: Family Medicine

## 2022-09-18 ENCOUNTER — Other Ambulatory Visit: Payer: Self-pay | Admitting: Family Medicine

## 2022-09-18 NOTE — Telephone Encounter (Signed)
Requested Prescriptions  Pending Prescriptions Disp Refills   benazepril (LOTENSIN) 40 MG tablet [Pharmacy Med Name: BENAZEPRIL HCL 40 MG TABLETS] 90 tablet 0    Sig: TAKE 1 TABLET BY MOUTH ONCE DAILY.     Cardiovascular:  ACE Inhibitors Failed - 09/18/2022  2:48 PM      Failed - Cr in normal range and within 180 days    Creat  Date Value Ref Range Status  06/01/2021 1.47 (H) 0.70 - 1.35 mg/dL Final   Creatinine, Ser  Date Value Ref Range Status  08/16/2021 1.56 (H) 0.61 - 1.24 mg/dL Final   Creatinine, Urine  Date Value Ref Range Status  06/01/2021 144 20 - 320 mg/dL Final         Failed - K in normal range and within 180 days    Potassium  Date Value Ref Range Status  08/16/2021 3.8 3.5 - 5.1 mmol/L Final         Failed - Valid encounter within last 6 months    Recent Outpatient Visits           8 months ago Foot pain, right   [place] Susy Frizzle, MD   11 months ago Pulmonary emphysema, unspecified emphysema type ([place])   [place] [name], [name], MD   1 year ago Current every day smoker   [place] [name], [name], MD   1 year ago Acute pain of left knee   [name], [name], MD   1 year ago Other cirrhosis of liver [facility])   [place], [name], MD              Passed - Patient is not pregnant      Passed - Last BP in normal range    BP Readings from Last 1 Encounters:  03/18/22 136/80

## 2022-10-10 ENCOUNTER — Other Ambulatory Visit: Payer: Self-pay | Admitting: Family Medicine

## 2022-10-10 DIAGNOSIS — G629 Polyneuropathy, unspecified: Secondary | ICD-10-CM

## 2022-11-06 ENCOUNTER — Other Ambulatory Visit: Payer: Self-pay | Admitting: Family Medicine

## 2022-11-07 ENCOUNTER — Other Ambulatory Visit: Payer: Self-pay

## 2022-11-07 ENCOUNTER — Telehealth: Payer: Self-pay

## 2022-11-07 MED ORDER — METFORMIN HCL 1000 MG PO TABS: ORAL_TABLET | ORAL | 0 refills | Status: DC

## 2022-11-07 NOTE — Telephone Encounter (Signed)
Called and spoke with pt about scheduling a cpe with pcp for future refills. Pt states that he is no longer working and does not have insurance right now to help pay for meds or his appts. I did schedule pt for cpe and made pt aware that he would get a courtesy refill until his scheduled cpe on 12/16/22. Please advise.  Cb#: [phone]

## 2022-11-07 NOTE — Telephone Encounter (Signed)
Requested medications are due for refill today.  yes  Requested medications are on the active medications list.  yes  Last refill. 08/28/2022 #120 0 rf  Future visit scheduled.   no  Notes to clinic.  Labs are expired. Pt needs an appt.    Requested Prescriptions  Pending Prescriptions Disp Refills   metFORMIN (GLUCOPHAGE) 1000 MG tablet [Pharmacy Med Name: METFORMIN HCL 1,000 MG TABLET] 120 tablet 0    Sig: TAKE (1) TABLET BY MOUTH TWICE A DAY WITH A MEAL.     Endocrinology:  Diabetes - Biguanides Failed - 11/06/2022  3:36 PM      Failed - Cr in normal range and within 360 days    Creat  Date Value Ref Range Status  06/01/2021 1.47 (H) 0.70 - 1.35 mg/dL Final   Creatinine, Ser  Date Value Ref Range Status  08/16/2021 1.56 (H) 0.61 - 1.24 mg/dL Final   Creatinine, Urine  Date Value Ref Range Status  06/01/2021 144 20 - 320 mg/dL Final         Failed - HBA1C is between 0 and 7.9 and within 180 days    Hgb A1c MFr Bld  Date Value Ref Range Status  06/01/2021 6.0 (H) <5.7 % of total Hgb Final    Comment:    For someone without known diabetes, a hemoglobin  A1c value between 5.7% and 6.4% is consistent with prediabetes and should be confirmed with a  follow-up test. . For someone with known diabetes, a value <7% indicates that their diabetes is well controlled. A1c targets should be individualized based on duration of diabetes, age, comorbid conditions, and other considerations. . This assay result is consistent with an increased risk of diabetes. . Currently, no consensus exists regarding use of hemoglobin A1c for diagnosis of diabetes for children. .          Failed - eGFR in normal range and within 360 days    GFR, Est African American  Date Value Ref Range Status  04/07/2020 71 > OR = 60 mL/min/1.23m2 Final   GFR, Est Non African American  Date Value Ref Range Status  04/07/2020 61 > OR = 60 mL/min/1.52m2 Final   GFR, Estimated  Date Value Ref Range  Status  08/16/2021 50 (L) >60 mL/min Final    Comment:    (NOTE) Calculated using the CKD-EPI Creatinine Equation (2021)    eGFR  Date Value Ref Range Status  06/01/2021 54 (L) > OR = 60 mL/min/1.62m2 Final    Comment:    The eGFR is based on the CKD-EPI 2021 equation. To calculate  the new eGFR from a previous Creatinine or Cystatin C result, go to https://www.kidney.org/professionals/ kdoqi/gfr%5Fcalculator          Failed - B12 Level in normal range and within 720 days    No results found for: "VITAMINB12"       Failed - Valid encounter within last 6 months    Recent Outpatient Visits           10 months ago Foot pain, right   [facility] [name], Priscille Heidelberg, MD   1 year ago Pulmonary emphysema, unspecified emphysema type (HCC)   [facility] Donita Brooks, MD   1 year ago Current every day smoker   [facility] [name], Priscille Heidelberg, MD   1 year ago Acute pain of left knee   [facility] [name], Priscille Heidelberg, MD   1  year ago Other cirrhosis of liver (HCC)   [facility] [name], Priscille Heidelberg, MD              Failed - CBC within normal limits and completed in the last 12 months    WBC  Date Value Ref Range Status  08/16/2021 12.7 (H) 4.0 - 10.5 K/uL Final   RBC  Date Value Ref Range Status  08/16/2021 4.85 4.22 - 5.81 MIL/uL Final   Hemoglobin  Date Value Ref Range Status  08/16/2021 14.9 13.0 - 17.0 g/dL Final   HCT  Date Value Ref Range Status  08/16/2021 43.2 39.0 - 52.0 % Final   MCHC  Date Value Ref Range Status  08/16/2021 34.5 30.0 - 36.0 g/dL Final   [facility]  Date Value Ref Range Status  08/16/2021 30.7 26.0 - 34.0 pg Final   MCV  Date Value Ref Range Status  08/16/2021 89.1 80.0 - 100.0 fL Final   No results found for: "PLTCOUNTKUC", "LABPLAT", "POCPLA" RDW  Date Value Ref Range Status  08/16/2021 13.0 11.5 - 15.5 % Final

## 2022-11-11 ENCOUNTER — Other Ambulatory Visit: Payer: Self-pay

## 2022-11-11 DIAGNOSIS — E7800 Pure hypercholesterolemia, unspecified: Secondary | ICD-10-CM

## 2022-11-11 DIAGNOSIS — I10 Essential (primary) hypertension: Secondary | ICD-10-CM

## 2022-11-11 DIAGNOSIS — G629 Polyneuropathy, unspecified: Secondary | ICD-10-CM

## 2022-11-11 MED ORDER — GABAPENTIN 300 MG PO CAPS: ORAL_CAPSULE | ORAL | 1 refills | Status: DC

## 2022-11-11 MED ORDER — ATORVASTATIN CALCIUM 10 MG PO TABS: ORAL_TABLET | ORAL | 1 refills | Status: DC

## 2022-11-11 MED ORDER — DULOXETINE HCL 60 MG PO CPEP: 60.0000 mg | ORAL_CAPSULE | Freq: Every day | ORAL | 1 refills | Status: DC

## 2022-11-11 MED ORDER — BENAZEPRIL HCL 40 MG PO TABS: 40.0000 mg | ORAL_TABLET | Freq: Every day | ORAL | 1 refills | Status: DC

## 2022-11-11 MED ORDER — METFORMIN HCL 1000 MG PO TABS
ORAL_TABLET | ORAL | 1 refills | Status: DC
Start: 2022-11-11 — End: 2023-06-12

## 2022-11-11 MED ORDER — HYDROCHLOROTHIAZIDE 25 MG PO TABS: ORAL_TABLET | ORAL | 1 refills | Status: DC

## 2022-11-11 MED ORDER — ROPINIROLE HCL 1 MG PO TABS
ORAL_TABLET | ORAL | 1 refills | Status: DC
Start: 2022-11-11 — End: 2024-04-14

## 2022-12-16 ENCOUNTER — Encounter: Payer: Managed Care, Other (non HMO) | Admitting: Family Medicine

## 2023-01-08 DIAGNOSIS — E1165 Type 2 diabetes mellitus with hyperglycemia: Secondary | ICD-10-CM | POA: Insufficient documentation

## 2023-02-10 ENCOUNTER — Encounter: Payer: Self-pay | Admitting: *Deleted

## 2023-06-09 ENCOUNTER — Other Ambulatory Visit: Payer: Self-pay | Admitting: Family Medicine

## 2023-06-09 DIAGNOSIS — I10 Essential (primary) hypertension: Secondary | ICD-10-CM

## 2023-06-10 NOTE — Telephone Encounter (Signed)
Requested medications are due for refill today.  yes  Requested medications are on the active medications list.  yes  Last refill. 11/11/2022 #90 1 rf  Future visit scheduled.   no  Notes to clinic.  Pt is more than 3 months overdue for an ov    Requested Prescriptions  Pending Prescriptions Disp Refills   benazepril (LOTENSIN) 40 MG tablet [Pharmacy Med Name: benazepril 40 mg tablet] 90 tablet 1    Sig: TAKE 1 TABLET BY MOUTH ONCE DAILY.     Cardiovascular:  ACE Inhibitors Failed - 06/09/2023  3:06 PM      Failed - Cr in normal range and within 180 days    Creat  Date Value Ref Range Status  06/01/2021 1.47 (H) 0.70 - 1.35 mg/dL Final   Creatinine, Ser  Date Value Ref Range Status  08/16/2021 1.56 (H) 0.61 - 1.24 mg/dL Final   Creatinine, Urine  Date Value Ref Range Status  06/01/2021 144 20 - 320 mg/dL Final         Failed - K in normal range and within 180 days    Potassium  Date Value Ref Range Status  08/16/2021 3.8 3.5 - 5.1 mmol/L Final         Failed - Valid encounter within last 6 months    Recent Outpatient Visits           1 year ago Foot pain, right   [facility] [name], Priscille Heidelberg, MD   1 year ago Pulmonary emphysema, unspecified emphysema type (HCC)   [facility] [name], Priscille Heidelberg, MD   1 year ago Current every day smoker   [facility] Donita Brooks, MD   2 years ago Acute pain of left knee   [facility] Donita Brooks, MD   2 years ago Other cirrhosis of liver [facility])   [facility] [name], Priscille Heidelberg, MD              Passed - Patient is not pregnant      Passed - Last BP in normal range    BP Readings from Last 1 Encounters:  03/18/22 136/80

## 2023-06-12 ENCOUNTER — Telehealth: Payer: Self-pay

## 2023-06-12 DIAGNOSIS — G629 Polyneuropathy, unspecified: Secondary | ICD-10-CM

## 2023-06-12 MED ORDER — METFORMIN HCL 1000 MG PO TABS
ORAL_TABLET | ORAL | 0 refills | Status: DC
Start: 2023-06-12 — End: 2023-10-07

## 2023-06-12 NOTE — Telephone Encounter (Signed)
Courtesy refill sent. My Chart message sent to patient to contact office for appointment. Mjp,lpn Hi Tom,   I have sent in a refill on your Metformin. We do need to have you call the office to schedule an appointment to be seen before further refills can be given. Please give [name] a call at [phone]. Thank you!  Germaine Pomfret Copied from CRM [id number]. Topic: Clinical - Medication Refill >> Jun 12, 2023 10:49 AM Almira Coaster wrote: Most Recent Primary Care Visit:  Provider: Lynnea Ferrier T  Department: [facility]  Visit Type: OFFICE VISIT  Date: 03/18/2022  Medication: metFORMIN (GLUCOPHAGE) 1000 MG tablet  Has the patient contacted their pharmacy? Yes (Agent: If no, request that the patient contact the pharmacy for the refill. If patient does not wish to contact the pharmacy document the reason why and proceed with request.) (Agent: If yes, when and what did the pharmacy advise?)  Is this the correct pharmacy for this prescription? Yes If no, delete pharmacy and type the correct one.  This is the patient's preferred pharmacy:  [facility] [place], [place] - [id number] [place] [address] Phone: [phone] Fax: [phone]   Has the prescription been filled recently? No  Is the patient out of the medication? Yes, he is completely   Has the patient been seen for an appointment in the last year OR does the patient have an upcoming appointment? No.   Can we respond through MyChart? Yes  Agent: Please be advised that Rx refills may take up to 3 business days. We ask that you follow-up with your pharmacy.

## 2023-06-13 ENCOUNTER — Telehealth: Payer: Self-pay

## 2023-06-13 DIAGNOSIS — I10 Essential (primary) hypertension: Secondary | ICD-10-CM

## 2023-06-13 MED ORDER — BENAZEPRIL HCL 40 MG PO TABS
40.0000 mg | ORAL_TABLET | Freq: Every day | ORAL | 0 refills | Status: DC
Start: 2023-06-13 — End: 2023-07-08

## 2023-06-13 NOTE — Telephone Encounter (Signed)
Courtesy refill sent. My chart message sent 11/14 for pt to call and schedule an appointment. Mjp,lpn  Copied from CRM [id number]. Topic: Clinical - Medication Refill >> Jun 12, 2023  3:38 PM Tiffany H wrote: Most Recent Primary Care Visit:  Provider: Lynnea Ferrier T  Department: [facility]  Visit Type: OFFICE VISIT  Date: 03/18/2022  Medication: benazepril (LOTENSIN) 40 MG tablet  Has the patient contacted their pharmacy? Yes (Agent: If no, request that the patient contact the pharmacy for the refill. If patient does not wish to contact the pharmacy document the reason why and proceed with request.) (Agent: If yes, when and what did the pharmacy advise?)  Is this the correct pharmacy for this prescription? Yes If no, delete pharmacy and type the correct one.  This is the patient's preferred pharmacy:  [facility] [place], [place] - [id number] [place] [address] Phone: [phone] Fax: [phone]   Has the prescription been filled recently? No  Is the patient out of the medication? Yes. Patient has been out of medication for the past 2 days.   Has the patient been seen for an appointment in the last year OR does the patient have an upcoming appointment? Yes. Patient is scheduled for an appointment on January 16th.   Can we respond through MyChart? Yes  Agent: Please be advised that Rx refills may take up to 3 business days. We ask that you follow-up with your pharmacy.

## 2023-07-08 ENCOUNTER — Other Ambulatory Visit: Payer: Self-pay | Admitting: Family Medicine

## 2023-07-08 DIAGNOSIS — G629 Polyneuropathy, unspecified: Secondary | ICD-10-CM

## 2023-07-08 DIAGNOSIS — I10 Essential (primary) hypertension: Secondary | ICD-10-CM

## 2023-07-08 MED ORDER — BENAZEPRIL HCL 40 MG PO TABS
40.0000 mg | ORAL_TABLET | Freq: Every day | ORAL | 0 refills | Status: DC
Start: 2023-07-08 — End: 2023-08-15

## 2023-07-08 NOTE — Telephone Encounter (Signed)
Copied from CRM [id number]. Topic: Clinical - Medication Refill >> Jul 08, 2023  9:00 AM Maxwell Marion wrote: Most Recent Primary Care Visit:  Provider: Lynnea Ferrier T  Department: [facility]  Visit Type: OFFICE VISIT  Date: 03/18/2022  Medication: ***  Has the patient contacted their pharmacy?  (Agent: If no, request that the patient contact the pharmacy for the refill. If patient does not wish to contact the pharmacy document the reason why and proceed with request.) (Agent: If yes, when and what did the pharmacy advise?)  Is this the correct pharmacy for this prescription?  If no, delete pharmacy and type the correct one.  This is the patient's preferred pharmacy:  [facility] [place], [place] - [id number] [place] [address] Phone: [phone] Fax: [phone]   Has the prescription been filled recently?   Is the patient out of the medication?   Has the patient been seen for an appointment in the last year OR does the patient have an upcoming appointment?   Can we respond through MyChart?   Agent: Please be advised that Rx refills may take up to 3 business days. We ask that you follow-up with your pharmacy.

## 2023-07-17 ENCOUNTER — Ambulatory Visit: Payer: Self-pay | Admitting: Family Medicine

## 2023-07-24 ENCOUNTER — Other Ambulatory Visit: Payer: Self-pay | Admitting: Family Medicine

## 2023-07-24 DIAGNOSIS — G629 Polyneuropathy, unspecified: Secondary | ICD-10-CM

## 2023-08-06 ENCOUNTER — Other Ambulatory Visit: Payer: Self-pay | Admitting: Family Medicine

## 2023-08-14 ENCOUNTER — Ambulatory Visit (INDEPENDENT_AMBULATORY_CARE_PROVIDER_SITE_OTHER): Payer: No Typology Code available for payment source | Admitting: Family Medicine

## 2023-08-14 ENCOUNTER — Encounter: Payer: Self-pay | Admitting: Family Medicine

## 2023-08-14 ENCOUNTER — Other Ambulatory Visit: Payer: Self-pay | Admitting: Family Medicine

## 2023-08-14 VITALS — BP 142/86 | HR 91 | Temp 97.8°F | Ht 69.0 in | Wt 253.0 lb

## 2023-08-14 DIAGNOSIS — Z0000 Encounter for general adult medical examination without abnormal findings: Secondary | ICD-10-CM

## 2023-08-14 DIAGNOSIS — Z0001 Encounter for general adult medical examination with abnormal findings: Secondary | ICD-10-CM | POA: Diagnosis not present

## 2023-08-14 DIAGNOSIS — Z125 Encounter for screening for malignant neoplasm of prostate: Secondary | ICD-10-CM

## 2023-08-14 DIAGNOSIS — I10 Essential (primary) hypertension: Secondary | ICD-10-CM

## 2023-08-14 DIAGNOSIS — E7800 Pure hypercholesterolemia, unspecified: Secondary | ICD-10-CM

## 2023-08-14 DIAGNOSIS — Z23 Encounter for immunization: Secondary | ICD-10-CM | POA: Diagnosis not present

## 2023-08-14 DIAGNOSIS — E1165 Type 2 diabetes mellitus with hyperglycemia: Secondary | ICD-10-CM

## 2023-08-14 DIAGNOSIS — J449 Chronic obstructive pulmonary disease, unspecified: Secondary | ICD-10-CM

## 2023-08-14 MED ORDER — FLUTICASONE-SALMETEROL 250-50 MCG/ACT IN AEPB: 1.0000 | INHALATION_SPRAY | Freq: Two times a day (BID) | RESPIRATORY_TRACT | 11 refills | Status: AC

## 2023-08-14 MED ORDER — ALBUTEROL SULFATE HFA 108 (90 BASE) MCG/ACT IN AERS: 2.0000 | INHALATION_SPRAY | Freq: Four times a day (QID) | RESPIRATORY_TRACT | 3 refills | Status: AC | PRN

## 2023-08-14 MED ORDER — TAMSULOSIN HCL 0.4 MG PO CAPS: 0.4000 mg | ORAL_CAPSULE | Freq: Every day | ORAL | 3 refills | Status: DC

## 2023-08-14 NOTE — Addendum Note (Signed)
Addended by: Venia Carbon K on: 08/14/2023 09:49 AM   Modules accepted: Orders

## 2023-08-14 NOTE — Progress Notes (Signed)
Subjective:    Patient ID: Jason French, male    DOB: 05-Dec-1960, 63 y.o.   MRN: [id number] Patient is a 63 year old Caucasian gentleman who presents today for physical.  He has known COPD.  He is a smoker.  He has not been using breztri due to cost.  He is out of albuterol.  Today he is wheezing profusely on exam.  He does have shortness of breath with activity but denies chest pain.  He is due for a flu shot, shingles vaccine, and the pneumonia vaccine.  He agrees to receive the pneumonia vaccine today.  His colonoscopy is due.  Last colonoscopy revealed several polyps and they recommended repeat colonoscopy in 2024.  Patient declines to schedule this.  He agrees to allow me to check a PSA to screen for prostate cancer.  He is due for lung cancer screening but he declines for me to schedule him for a CAT scan.  Blood pressure today is borderline but he states that his blood pressure at home is consistently less than 140/90.  He does report nocturia.  He also reports that he does not feel like he is able to completely empty his bladder Past Medical History:  Diagnosis Date   Arthritis    Bronchitis 10/27/2020   Cancer (HCC)    adrenal cancer   Diabetes mellitus without complication (HCC)    Diverticulitis 2006   s/p sigmoid colectomy, continues with scattered pancolinic diverticula   Hyperlipidemia    Hypertension    Neuropathy    RLS (restless legs syndrome)    Sleep apnea    use CPAP   Past Surgical History:  Procedure Laterality Date   BIOPSY  03/13/2020   Procedure: BIOPSY;  Surgeon: Corbin Ade, MD;  [facility];  Service: Endoscopy;;   COLON SURGERY  2006   ruptured diverticulosis; sigmoid resection.    COLONOSCOPY  10/26/2004   Dr. [name]; internal hemorrhoids, few scattered pancolonic diverticula, 3 left colon polyps.  Cannot locate path in chart.   COLONOSCOPY  03/19/2010   Friable anal canal, diminutive polyps about the sigmoid and anastomosis at 30 cm ablated  and/or snare removed, multiple polyps from ileocecal valve biopsied/ ablated/or snared.  Residual scattered pancolonic diverticula.  Suspected rectal bleeding from anorectum in the setting of IBS.  Ileocecal pathology with tubular adenomas, otherwise hyperplastic polyps/benign small bowel mucosa. 7 yr repeat   COLONOSCOPY WITH PROPOFOL N/A 03/13/2020   Procedure: COLONOSCOPY WITH PROPOFOL;  Surgeon: Corbin Ade, MD;  [facility]: [facility];  Service: Endoscopy;  Laterality: N/A;  7:30am   ESOPHAGOGASTRODUODENOSCOPY (EGD) WITH PROPOFOL N/A 03/13/2020   Procedure: ESOPHAGOGASTRODUODENOSCOPY (EGD) WITH PROPOFOL;  Surgeon: Corbin Ade, MD;  [facility];  Service: Endoscopy;  Laterality: N/A;   KNEE ARTHROSCOPY WITH MEDIAL MENISECTOMY Right 06/12/2017   Procedure: KNEE ARTHROSCOPY WITH PARTIAL MEDIAL MENISECTOMY;  Surgeon: Vickki Hearing, MD;  Location: [facility];  Service: Orthopedics;  Laterality: Right;   MALONEY DILATION N/A 03/13/2020   Procedure: Elease Hashimoto DILATION;  Surgeon: Corbin Ade, MD;  [facility]: [facility];  Service: Endoscopy;  Laterality: N/A;   POLYPECTOMY  03/13/2020   Procedure: POLYPECTOMY;  Surgeon: Corbin Ade, MD;  [facility];  Service: Endoscopy;;   RESECTION OF ABDOMINAL MASS     adrenal mass right (cancer)   TOTAL KNEE ARTHROPLASTY Right 07/13/2018   Procedure: RIGHT TOTAL KNEE ARTHROPLASTY,INJECTION OF LEFT KNEE;  Surgeon: Ollen Gross, MD;  Location: [facility];  Service: Orthopedics;  Laterality: Right;    Current Outpatient Medications on File Prior to Visit  Medication Sig Dispense Refill   albuterol (PROVENTIL) (2.5 MG/3ML) 0.083% nebulizer solution Take 3 mLs (2.5 mg total) by nebulization every 6 (six) hours as needed for wheezing or shortness of breath. Use this medication OR rescue inhaler every 6 hours as needed.  Not both. 150 mL 3   atorvastatin (LIPITOR) 10 MG tablet TAKE ONE (1) TABLET BY MOUTH AT BEDTIME. 90 tablet 1    benazepril (LOTENSIN) 40 MG tablet Take 1 tablet (40 mg total) by mouth daily. 30 tablet 0   DULoxetine (CYMBALTA) 60 MG capsule TAKE ONE CAPSULE BY MOUTH ONCE DAILY. 90 capsule 1   fluticasone (FLONASE) 50 MCG/ACT nasal spray Place 2 sprays into both nostrils daily. 16 g 6   gabapentin (NEURONTIN) 300 MG capsule TAKE 2 CAPSULES BY MOUTH THREE TIMES A DAY. 540 capsule 1   HYDROcodone-acetaminophen (NORCO) 5-325 MG tablet Take 1 tablet by mouth every 6 (six) hours as needed for moderate pain. 30 tablet 0   metFORMIN (GLUCOPHAGE) 1000 MG tablet TAKE (1) TABLET BY MOUTH TWICE A DAY WITH A MEAL. 180 tablet 0   pantoprazole (PROTONIX) 40 MG tablet TAKE (1) TABLET BY MOUTH ONCE DAILY. 90 tablet 3   rOPINIRole (REQUIP) 1 MG tablet TAKE TWO (2) TABLETS BY MOUTH AT BEDTIME. 120 tablet 1   hydrochlorothiazide (HYDRODIURIL) 25 MG tablet TAKE ONE (1) TABLET BY MOUTH ONCE DAILY. (Patient not taking: Reported on 08/14/2023) 90 tablet 1   No current facility-administered medications on file prior to visit.   No Known Allergies Social History   Socioeconomic History   Marital status: Married    Spouse name: Not on file   Number of children: Not on file   Years of education: Not on file   Highest education level: Not on file  Occupational History   Not on file  Tobacco Use   Smoking status: Every Day    Current packs/day: 2.00    Average packs/day: 2.0 packs/day for 40.0 years (80.0 ttl pk-yrs)    Types: Cigarettes   Smokeless tobacco: Never  Vaping Use   Vaping status: Never Used  Substance and Sexual Activity   Alcohol use: Yes    Comment: once a month   Drug use: No   Sexual activity: Yes  Other Topics Concern   Not on file  Social History Narrative   Not on file   Social Drivers of Health   Financial Resource Strain: Not on file  Food Insecurity: Not on file  Transportation Needs: Not on file  Physical Activity: Not on file  Stress: Not on file  Social Connections: Unknown  (12/09/2021)   Received from [facility]   Social Network    Social Network: Not on file  Intimate Partner Violence: Unknown (10/31/2021)   Received from [facility], [facility]   HITS    Physically Hurt: Not on file    Insult or Talk Down To: Not on file    Threaten Physical Harm: Not on file    Scream or Curse: Not on file      Review of Systems  All other systems reviewed and are negative.      Objective:   Physical Exam Vitals reviewed.  Constitutional:      General: He is not in acute distress.    Appearance: He is well-developed. He is not diaphoretic.  HENT:  Head: Normocephalic and atraumatic.     Right Ear: External ear normal.     Left Ear: External ear normal.     Nose: Nose normal.     Mouth/Throat:     Pharynx: No oropharyngeal exudate.  Eyes:     General: No scleral icterus.       Right eye: No discharge.        Left eye: No discharge.     Conjunctiva/sclera: Conjunctivae normal.     Pupils: Pupils are equal, round, and reactive to light.  Neck:     Thyroid: No thyromegaly.     Vascular: No JVD.     Trachea: No tracheal deviation.  Cardiovascular:     Rate and Rhythm: Normal rate and regular rhythm.     Heart sounds: Normal heart sounds. No murmur heard.    No friction rub. No gallop.  Pulmonary:     Effort: Pulmonary effort is normal. No respiratory distress.     Breath sounds: No stridor. Examination of the right-upper field reveals decreased breath sounds. Examination of the left-upper field reveals decreased breath sounds. Examination of the right-middle field reveals decreased breath sounds. Examination of the left-middle field reveals decreased breath sounds. Examination of the right-lower field reveals decreased breath sounds. Examination of the left-lower field reveals decreased breath sounds. Decreased breath sounds present. No wheezing, rhonchi or rales.  Chest:     Chest wall: No tenderness.  Abdominal:     General:  Bowel sounds are normal. There is no distension.     Palpations: Abdomen is soft. There is no mass.     Tenderness: There is no abdominal tenderness. There is no guarding or rebound.  Musculoskeletal:        General: No tenderness or deformity. Normal range of motion.     Cervical back: Normal range of motion and neck supple.  Lymphadenopathy:     Cervical: No cervical adenopathy.  Skin:    General: Skin is warm.     Coloration: Skin is not pale.     Findings: No erythema or rash.  Neurological:     Mental Status: He is alert and oriented to person, place, and time.     Cranial Nerves: No cranial nerve deficit.     Motor: No abnormal muscle tone.     Coordination: Coordination normal.     Deep Tendon Reflexes: Reflexes are normal and symmetric.  Psychiatric:        Behavior: Behavior normal.        Thought Content: Thought content normal.        Judgment: Judgment normal.           Assessment & Plan:  Essential hypertension  Type 2 diabetes mellitus with hyperglycemia, without long-term current use of insulin (HCC) - Plan: Hemoglobin A1c, CBC with Differential/Platelet, COMPLETE METABOLIC PANEL WITH GFR, Lipid panel, Microalbumin / creatinine urine ratio  Pure hypercholesterolemia  Prostate cancer screening - Plan: PSA  Chronic obstructive pulmonary disease, unspecified COPD type (HCC) - Plan: albuterol (VENTOLIN HFA) 108 (90 Base) MCG/ACT inhaler  General medical exam Due to cost, we will discontinue breztri and replace with Advair 250/50 1 inhalation twice daily.  I refilled his albuterol.  Strongly encouraged him to discontinue smoking or try to cut back.  Recommended a colonoscopy.  Recommended a CT scan of the lungs to screen for lung cancer.  He declines both of these today.  He agrees to check a PSA.  Check CBC CMP lipid panel  and A1c.  I would like to see his A1c less than 6.5 and his LDL cholesterol less than 161.  Blood pressure is elevated.  He states that he will  check his blood pressure consistently at home but that his blood pressure is less than this at home.  I will start the patient on Flomax 0.4 mg p.o. daily to help with lower urinary tract symptoms.  Patient received a pneumonia vaccine and declines a flu shot and the shingles vaccine

## 2023-08-15 LAB — COMPLETE METABOLIC PANEL WITH GFR
AG Ratio: 1.7 (calc) (ref 1.0–2.5)
ALT: 7 U/L — ABNORMAL LOW (ref 9–46)
AST: 12 U/L (ref 10–35)
Albumin: 4.3 g/dL (ref 3.6–5.1)
Alkaline phosphatase (APISO): 79 U/L (ref 35–144)
BUN: 15 mg/dL (ref 7–25)
CO2: 31 mmol/L (ref 20–32)
Calcium: 9.9 mg/dL (ref 8.6–10.3)
Chloride: 98 mmol/L (ref 98–110)
Creat: 1.18 mg/dL (ref 0.70–1.35)
Globulin: 2.5 g/dL (ref 1.9–3.7)
Glucose, Bld: 91 mg/dL (ref 65–99)
Potassium: 4.6 mmol/L (ref 3.5–5.3)
Sodium: 137 mmol/L (ref 135–146)
Total Bilirubin: 1 mg/dL (ref 0.2–1.2)
Total Protein: 6.8 g/dL (ref 6.1–8.1)
eGFR: 69 mL/min/{1.73_m2} (ref >=60)

## 2023-08-15 LAB — CBC WITH DIFFERENTIAL/PLATELET
Absolute Lymphocytes: 3500 {cells}/uL (ref 850–3900)
Absolute Monocytes: 1163 {cells}/uL — ABNORMAL HIGH (ref 200–950)
Basophils Absolute: 100 {cells}/uL (ref 0–200)
Basophils Relative: 0.8 %
Eosinophils Absolute: 1625 {cells}/uL — ABNORMAL HIGH (ref 15–500)
Eosinophils Relative: 13 %
HCT: 47.3 % (ref 38.5–50.0)
Hemoglobin: 15.8 g/dL (ref 13.2–17.1)
MCH: 30.3 pg (ref 27.0–33.0)
MCHC: 33.4 g/dL (ref 32.0–36.0)
MCV: 90.8 fL (ref 80.0–100.0)
MPV: 9.7 fL (ref 7.5–12.5)
Monocytes Relative: 9.3 %
Neutro Abs: 6113 {cells}/uL (ref 1500–7800)
Neutrophils Relative %: 48.9 %
Platelets: 340 10*3/uL (ref 140–400)
RBC: 5.21 10*6/uL (ref 4.20–5.80)
RDW: 13.5 % (ref 11.0–15.0)
Total Lymphocyte: 28 %
WBC: 12.5 10*3/uL — ABNORMAL HIGH (ref 3.8–10.8)

## 2023-08-15 LAB — HEMOGLOBIN A1C
Hgb A1c MFr Bld: 6.2 %{Hb} — ABNORMAL HIGH (ref <5.7)
Mean Plasma Glucose: 131 mg/dL
eAG (mmol/L): 7.3 mmol/L

## 2023-08-15 LAB — PSA: PSA: 1.26 ng/mL (ref <=4.00)

## 2023-08-15 LAB — MICROALBUMIN / CREATININE URINE RATIO
Creatinine, Urine: 253 mg/dL (ref 20–320)
Microalb Creat Ratio: 93 mg/g{creat} — ABNORMAL HIGH (ref <30)
Microalb, Ur: 23.6 mg/dL

## 2023-08-15 LAB — LIPID PANEL
Cholesterol: 150 mg/dL (ref <200)
HDL: 39 mg/dL — ABNORMAL LOW (ref >=40)
LDL Cholesterol (Calc): 83 mg/dL
Non-HDL Cholesterol (Calc): 111 mg/dL (ref <130)
Total CHOL/HDL Ratio: 3.8 (calc) (ref <5.0)
Triglycerides: 190 mg/dL — ABNORMAL HIGH (ref <150)

## 2023-08-23 IMAGING — DX DG FOOT COMPLETE 3+V*R*
3 series · 3 of 3 positions shown · non-contrast
Comparison: None Available.

CLINICAL DATA: Pain over the third and fourth metatarsals. Fell 1
day prior to these radiographs. General right foot pain, swelling,
and bruising.

EXAM:
RIGHT FOOT COMPLETE - 3+ VIEW

[foot ap]
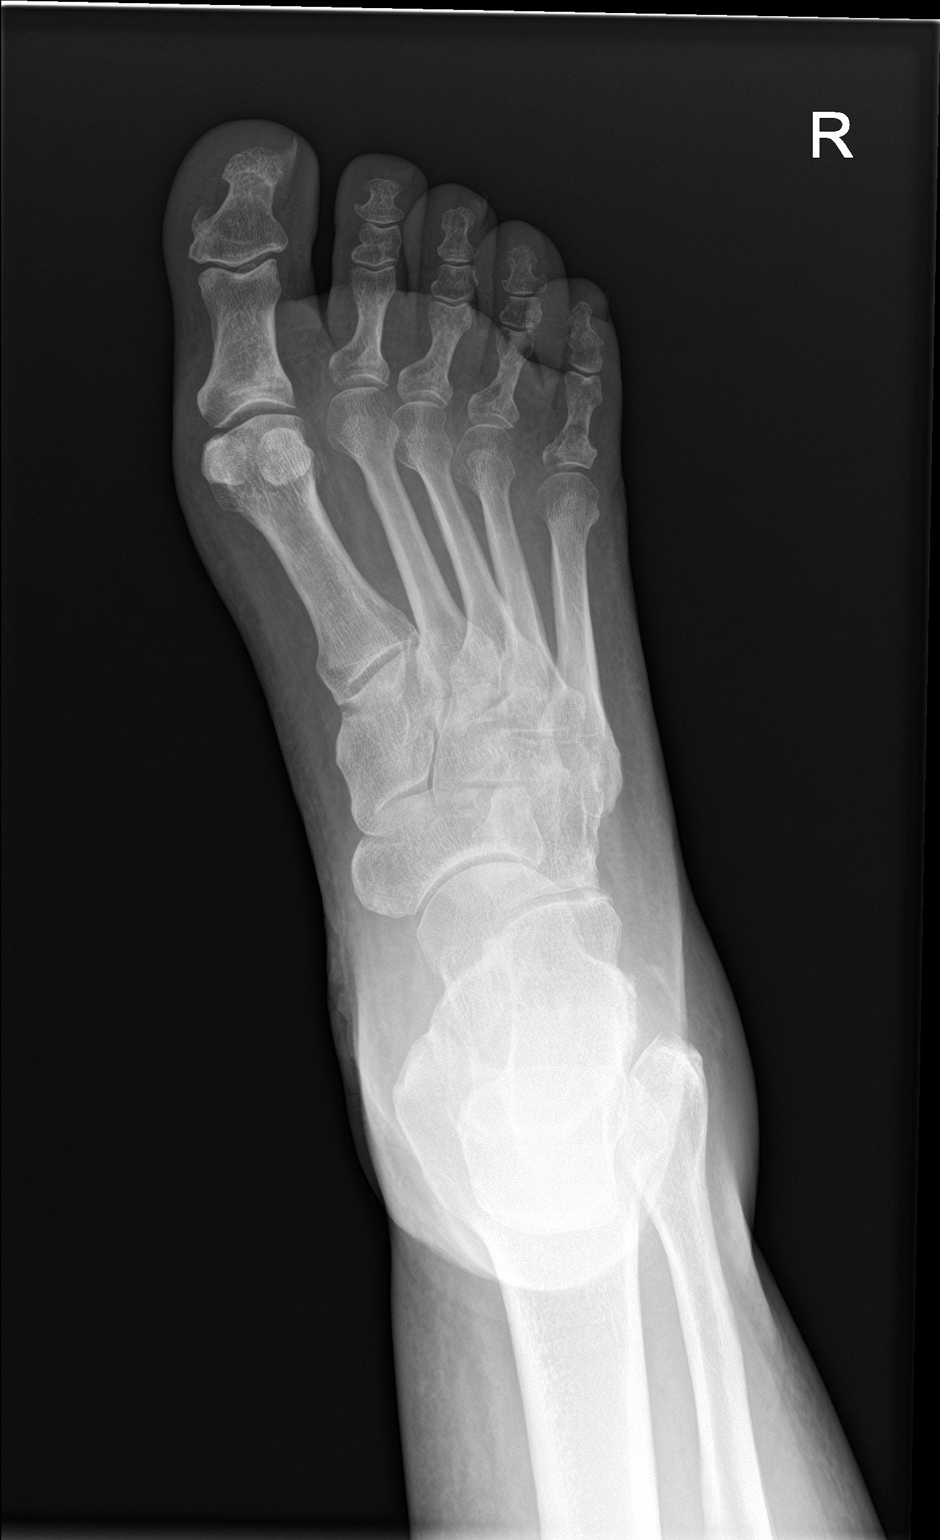

[foot obl]
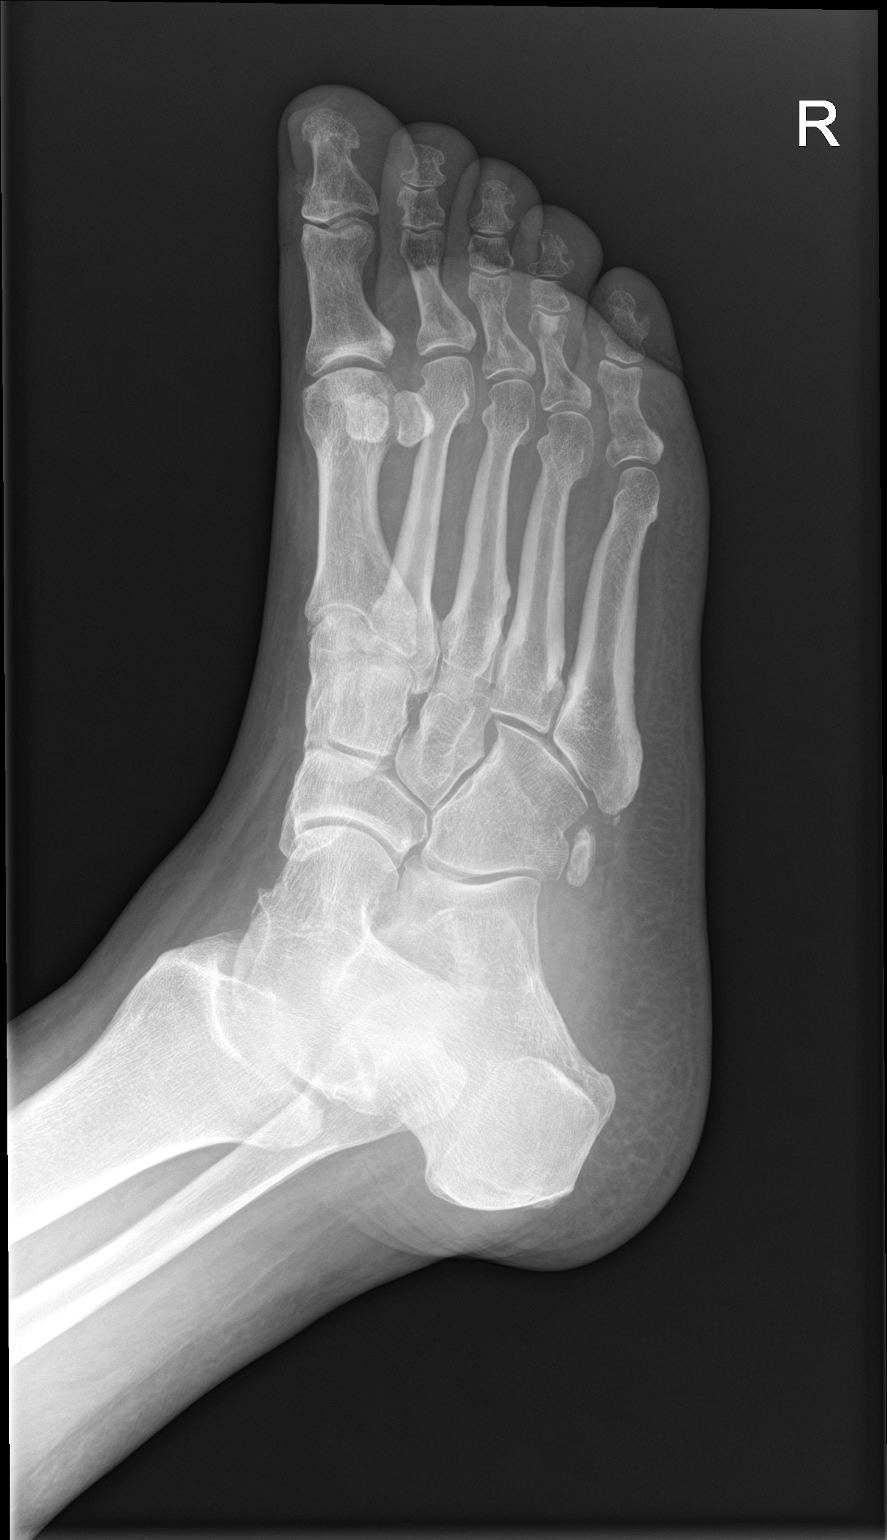

[foot lat]
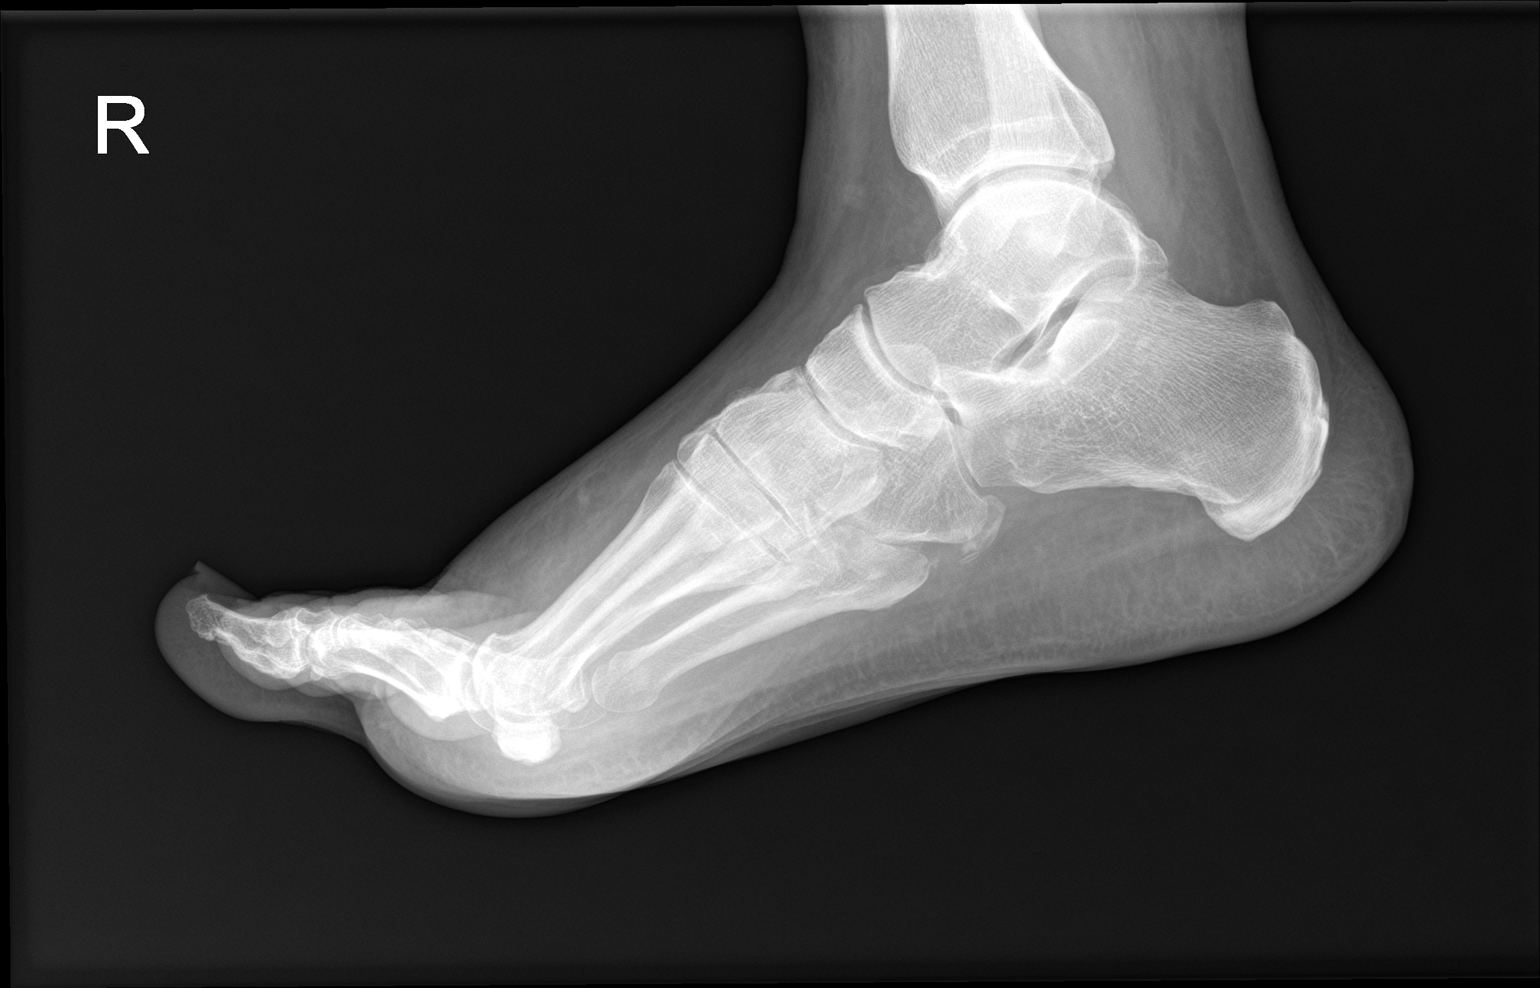

[3 of 3 positions shown; findings below may reference images not displayed]

FINDINGS: Mild great toe metatarsophalangeal joint peripheral degenerative
osteophytosis. Mild joint space narrowing of the interphalangeal
joints diffusely. Moderate second and third tarsometatarsal joint
space narrowing. Mild distal cuboid degenerative spurring at the
fourth tarsometatarsal joint. Minimal dorsal talonavicular
degenerative osteophytes. No acute fracture is seen. No dislocation.
IMPRESSION: Mild to moderate midfoot and forefoot osteoarthritis.

## 2023-09-15 ENCOUNTER — Other Ambulatory Visit: Payer: Self-pay | Admitting: Family Medicine

## 2023-09-15 DIAGNOSIS — I10 Essential (primary) hypertension: Secondary | ICD-10-CM

## 2023-10-06 ENCOUNTER — Other Ambulatory Visit: Payer: Self-pay | Admitting: Family Medicine

## 2023-10-06 DIAGNOSIS — G629 Polyneuropathy, unspecified: Secondary | ICD-10-CM

## 2023-10-07 NOTE — Telephone Encounter (Signed)
 Requested Prescriptions  Pending Prescriptions Disp Refills   metFORMIN (GLUCOPHAGE) 1000 MG tablet [Pharmacy Med Name: metformin 1,000 mg tablet] 180 tablet 1    Sig: TAKE (1) TABLET BY MOUTH TWICE A DAY WITH A MEAL.     Endocrinology:  Diabetes - Biguanides Failed - 10/07/2023 12:39 PM      Failed - B12 Level in normal range and within 720 days    No results found for: "VITAMINB12"       Failed - Valid encounter within last 6 months    Recent Outpatient Visits           1 year ago Foot pain, right   [facility] [name], Priscille Heidelberg, MD   1 year ago Pulmonary emphysema, unspecified emphysema type (HCC)   [facility] Donita Brooks, MD   2 years ago Current every day smoker   [facility] [name], Priscille Heidelberg, MD   2 years ago Acute pain of left knee   [facility] Donita Brooks, MD   2 years ago Other cirrhosis of liver [facility])   [facility] [name], Priscille Heidelberg, MD              Passed - Cr in normal range and within 360 days    Creat  Date Value Ref Range Status  08/14/2023 1.18 0.70 - 1.35 mg/dL Final   Creatinine, Urine  Date Value Ref Range Status  08/14/2023 253 20 - 320 mg/dL Final         Passed - HBA1C is between 0 and 7.9 and within 180 days    Hgb A1c MFr Bld  Date Value Ref Range Status  08/14/2023 6.2 (H) <5.7 % of total Hgb Final    Comment:    For someone without known diabetes, a hemoglobin  A1c value between 5.7% and 6.4% is consistent with prediabetes and should be confirmed with a  follow-up test. . For someone with known diabetes, a value <7% indicates that their diabetes is well controlled. A1c targets should be individualized based on duration of diabetes, age, comorbid conditions, and other considerations. . This assay result is consistent with an increased risk of diabetes. . Currently, no consensus exists regarding use of hemoglobin A1c for diagnosis of  diabetes for children. .          Passed - eGFR in normal range and within 360 days    GFR, Est African American  Date Value Ref Range Status  04/07/2020 71 > OR = 60 mL/min/1.77m2 Final   GFR, Est Non African American  Date Value Ref Range Status  04/07/2020 61 > OR = 60 mL/min/1.60m2 Final   GFR, Estimated  Date Value Ref Range Status  08/16/2021 50 (L) >60 mL/min Final    Comment:    (NOTE) Calculated using the CKD-EPI Creatinine Equation (2021)    eGFR  Date Value Ref Range Status  08/14/2023 69 > OR = 60 mL/min/1.37m2 Final         Passed - CBC within normal limits and completed in the last 12 months    WBC  Date Value Ref Range Status  08/14/2023 12.5 (H) 3.8 - 10.8 Thousand/uL Final   RBC  Date Value Ref Range Status  08/14/2023 5.21 4.20 - 5.80 Million/uL Final   Hemoglobin  Date Value Ref Range Status  08/14/2023 15.8 13.2 - 17.1 g/dL Final   HCT  Date Value Ref Range Status  08/14/2023  47.3 38.5 - 50.0 % Final   MCHC  Date Value Ref Range Status  08/14/2023 33.4 32.0 - 36.0 g/dL Final    Comment:    For adults, a slight decrease in the calculated MCHC value (in the range of 30 to 32 g/dL) is most likely not clinically significant; however, it should be interpreted with caution in correlation with other red cell parameters and the patient's clinical condition.    [facility]  Date Value Ref Range Status  08/14/2023 30.3 27.0 - 33.0 pg Final   MCV  Date Value Ref Range Status  08/14/2023 90.8 80.0 - 100.0 fL Final   No results found for: "PLTCOUNTKUC", "LABPLAT", "POCPLA" RDW  Date Value Ref Range Status  08/14/2023 13.5 11.0 - 15.0 % Final

## 2023-12-10 ENCOUNTER — Other Ambulatory Visit: Payer: Self-pay | Admitting: Family Medicine

## 2023-12-11 NOTE — Telephone Encounter (Signed)
 Requested Prescriptions  Pending Prescriptions Disp Refills   tamsulosin  (FLOMAX ) 0.4 MG CAPS capsule [Pharmacy Med Name: tamsulosin  0.4 mg capsule] 90 capsule 1    Sig: Take 1 capsule (0.4 mg total) by mouth daily.     Urology: Alpha-Adrenergic Blocker Failed - 12/11/2023  1:57 PM      Failed - Last BP in normal range    BP Readings from Last 1 Encounters:  08/14/23 (!) 142/86         Failed - Valid encounter within last 12 months    Recent Outpatient Visits           3 months ago Essential hypertension   [facility] [name], Cisco Crest, MD   1 year ago Current every day smoker   [facility] Austine Lefort, MD              Passed - PSA in normal range and within 360 days    PSA  Date Value Ref Range Status  08/14/2023 1.26 < OR = 4.00 ng/mL Final    Comment:    The total PSA value from this assay system is  standardized against the WHO standard. The test  result will be approximately 20% lower when compared  to the equimolar-standardized total PSA (Beckman  Coulter). Comparison of serial PSA results should be  interpreted with this fact in mind. . This test was performed using the Siemens  chemiluminescent method. Values obtained from  different assay methods cannot be used interchangeably. PSA levels, regardless of value, should not be interpreted as absolute evidence of the presence or absence of disease.

## 2023-12-25 ENCOUNTER — Other Ambulatory Visit: Payer: Self-pay | Admitting: Family Medicine

## 2023-12-25 DIAGNOSIS — G629 Polyneuropathy, unspecified: Secondary | ICD-10-CM

## 2023-12-28 ENCOUNTER — Other Ambulatory Visit: Payer: Self-pay | Admitting: Family Medicine

## 2023-12-28 DIAGNOSIS — I10 Essential (primary) hypertension: Secondary | ICD-10-CM

## 2023-12-29 NOTE — Telephone Encounter (Signed)
 Requested Prescriptions  Pending Prescriptions Disp Refills   benazepril  (LOTENSIN ) 40 MG tablet [Pharmacy Med Name: benazepril  40 mg tablet] 30 tablet 2    Sig: TAKE ONE TABLET BY MOUTH EVERY DAY.     Cardiovascular:  ACE Inhibitors Failed - 12/29/2023  4:26 PM      Failed - Last BP in normal range    BP Readings from Last 1 Encounters:  08/14/23 (!) 142/86         Failed - Valid encounter within last 6 months    Recent Outpatient Visits           4 months ago Essential hypertension   [facility] Austine Lefort, MD   1 year ago Current every day smoker   [facility] [name], Cisco Crest, MD              Passed - Cr in normal range and within 180 days    Creat  Date Value Ref Range Status  08/14/2023 1.18 0.70 - 1.35 mg/dL Final   Creatinine, Urine  Date Value Ref Range Status  08/14/2023 253 20 - 320 mg/dL Final         Passed - K in normal range and within 180 days    Potassium  Date Value Ref Range Status  08/14/2023 4.6 3.5 - 5.3 mmol/L Final         Passed - Patient is not pregnant

## 2024-01-29 ENCOUNTER — Other Ambulatory Visit: Payer: Self-pay | Admitting: Family Medicine

## 2024-01-29 DIAGNOSIS — G629 Polyneuropathy, unspecified: Secondary | ICD-10-CM

## 2024-03-26 ENCOUNTER — Other Ambulatory Visit: Payer: Self-pay | Admitting: Family Medicine

## 2024-03-26 DIAGNOSIS — G629 Polyneuropathy, unspecified: Secondary | ICD-10-CM

## 2024-04-14 ENCOUNTER — Other Ambulatory Visit: Payer: Self-pay | Admitting: Family Medicine

## 2024-04-14 DIAGNOSIS — I10 Essential (primary) hypertension: Secondary | ICD-10-CM

## 2024-04-14 DIAGNOSIS — G629 Polyneuropathy, unspecified: Secondary | ICD-10-CM

## 2024-07-03 ENCOUNTER — Other Ambulatory Visit: Payer: Self-pay | Admitting: Family Medicine

## 2024-07-03 DIAGNOSIS — G629 Polyneuropathy, unspecified: Secondary | ICD-10-CM

## 2024-07-06 ENCOUNTER — Encounter: Payer: Self-pay | Admitting: Emergency Medicine

## 2024-07-06 ENCOUNTER — Ambulatory Visit: Payer: Self-pay

## 2024-07-06 ENCOUNTER — Ambulatory Visit
Admission: EM | Admit: 2024-07-06 | Discharge: 2024-07-06 | Disposition: A | Discharge status: Home / Self Care | Attending: Family Medicine | Admitting: Family Medicine

## 2024-07-06 ENCOUNTER — Other Ambulatory Visit: Payer: Self-pay

## 2024-07-06 DIAGNOSIS — S39012A Strain of muscle, fascia and tendon of lower back, initial encounter: Secondary | ICD-10-CM

## 2024-07-06 LAB — POCT URINE DIPSTICK
Bilirubin, UA: NEGATIVE
Blood, UA: NEGATIVE
Glucose, UA: NEGATIVE mg/dL
Ketones, POC UA: NEGATIVE mg/dL
Leukocytes, UA: NEGATIVE
Nitrite, UA: NEGATIVE
POC PROTEIN,UA: 100 — AB
Spec Grav, UA: 1.025 (ref 1.010–1.025)
Urobilinogen, UA: 1 U/dL
pH, UA: 6 (ref 5.0–8.0)

## 2024-07-06 MED ORDER — TIZANIDINE HCL 4 MG PO TABS: 4.0000 mg | ORAL_TABLET | Freq: Three times a day (TID) | ORAL | 0 refills | Status: AC | PRN

## 2024-07-06 MED ORDER — IBUPROFEN 600 MG PO TABS: 600.0000 mg | ORAL_TABLET | Freq: Two times a day (BID) | ORAL | 0 refills | Status: AC | PRN

## 2024-07-06 NOTE — ED Triage Notes (Addendum)
 Pt reports lower back pain, urine odor,urinary frequency,nausea since Friday. Was referred to UC by PCP.   Pt given water , unable to provide specimen at this time.

## 2024-07-06 NOTE — Telephone Encounter (Signed)
 FYI Only or Action Required?: FYI only for provider: Urgent Care Advised, may also consider ED.  Patient was last seen in primary care on 08/14/2023 by Duanne Butler DASEN, MD.  Called Nurse Triage reporting Back Pain. Foul smelling urine. Worsening Neuropathy. Hands also becoming stiffer with tingling recently.   Symptoms began several days ago.  Interventions attempted: OTC medications: Tylenol  and Rest, hydration, or home remedies. Warm showers. Hydration.   Symptoms are: gradually worsening.  Triage Disposition: See HCP Within 4 Hours (Or PCP Triage)  Patient/caregiver understands and will follow disposition?: Yes    Copied from CRM #[id number]. Topic: Clinical - Red Word Triage >> Jul 06, 2024  4:29 PM Tobias L wrote: Red Word that prompted transfer to Nurse Triage: back pain, located more towards kidneys, neuropathy worsening   Reason for Disposition  Side (flank) or lower back pain present  Answer Assessment - Initial Assessment Questions For Symptoms of Back Pain and Neuropathy:  1. ONSET: When did the pain begin? (e.g., minutes, hours, days)     Since this weekend, started Friday. Went to basketball game sitting on bleachers, couldn't sit still. Was able to sleep that night after but not last couple nights. Cannot get comfortable in bed due to hurting.  Neuropathy has also been worsening.  Feels better in a hot shower when pain eases off.   2. LOCATION: Where does it hurt? (upper, mid or lower back)     Both sides, feels like a point on both sides of lower back. Goes to buttocks just below waistline, does not radiate into legs.   3. SEVERITY: How bad is the pain?  (e.g., Scale 1-10; mild, moderate, or severe)     Interfering with activities. Current 5/10.    4. PATTERN: Is the pain constant? (e.g., yes, no; constant, intermittent)      Constant currently.   5. RADIATION: Does the pain shoot into your legs or somewhere else?     Chronic neuropathy starting in  feet is now going up legs.  No radiation of back pain into legs.  Hands also getting stiffer and tingling recently.  6. CAUSE:  What do you think is causing the back pain?      Doesn't know. Takes medicine every day regularly.  Doesn't do anything out of the ordinary.  Losing weight- has been trying.   7. BACK OVERUSE:  Any recent lifting of heavy objects, strenuous work or exercise?     Denies any recent injury.   8. MEDICINES: What have you taken so far for the pain? (e.g., nothing, acetaminophen , NSAIDS)     Tylenol - not helping much.   9. NEUROLOGIC SYMPTOMS: Do you have any weakness, numbness, or problems with bowel/bladder control?     Unable to sleep last night.   10. OTHER SYMPTOMS: Do you have any other symptoms? (e.g., fever, abdomen pain, burning with urination, blood in urine)       Hx adrenal tumor. Had surgery to remove, inside was cancerous but not outer area of tumor.  No chemo or other treatments were needed after surgery.  Was having back pain then, but not recently. History of restless leg syndrome- takes medication sometimes but often makes sleepy the next day, thus doesn't always take.  Denies history of kidney stones and no blood in urine. Voiding normally, has noticed bad odor to urine however. Sometimes has bad odor to urine related to medications but reports this is different/ worse.  No sweet or metallic odor to urine.  Denies fever/chills.  No hx of UTIs or kidney infection.      For Urinary Symptoms:  OTHER SYMPTOMS: Do you have any other symptoms? (e.g., blood in urine, fever, flank pain, pain with urination)     Flank pain bilaterally. No known fevers however has been taking Tylenol .  Answer Assessment - Initial Assessment Questions 1. ONSET: When did the pain begin? (e.g., minutes, hours, days)     Since this weekend, started Friday. Went to basketball game sitting on bleachers, couldn't sit still. Was able to sleep that night after but not  last couple nights.   2. LOCATION: Where does it hurt? (upper, mid or lower back)     Both sides, point on both sides. Lower back. Goes to buttocks just below waistline.    3. SEVERITY: How bad is the pain?  (e.g., Scale 1-10; mild, moderate, or severe)     Interfering with activities. Current 5/10.    4. PATTERN: Is the pain constant? (e.g., yes, no; constant, intermittent)      Constant currently.   5. RADIATION: Does the pain shoot into your legs or somewhere else?     Neuropathy starting in feet and going up legs.  No radiation of back pain into legs.  Hands also getting stiffer and tingling.   6. CAUSE:  What do you think is causing the back pain?      Doesn't know. Takes medicine every day regularly.  Doesn't do anything out of the ordinary.  Losing weight- has been trying.   7. BACK OVERUSE:  Any recent lifting of heavy objects, strenuous work or exercise?     Denies any recent injury.   8. MEDICINES: What have you taken so far for the pain? (e.g., nothing, acetaminophen , NSAIDS)     Tylenol - not helping much.   9. NEUROLOGIC SYMPTOMS: Do you have any weakness, numbness, or problems with bowel/bladder control?     Unable to sleep last night.   10. OTHER SYMPTOMS: Do you have any other symptoms? (e.g., fever, abdomen pain, burning with urination, blood in urine)       Hx adrenal tumor. 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  No chemo or other treatments.  Back pain then, but not recent. History of restless leg syndrome- takes medication sometimes but often makes sleepy the next day.   Denies history of kidney stones and no blood in urine. Voiding normally, has notice bad odor to urine. Denies fever/chills.  No hx of UTIs or kidney infection.  Protocols used: Back Pain-A-AH, Urinary Symptoms-A-AH

## 2024-07-06 NOTE — ED Provider Notes (Signed)
 [facility]    CSN: [id number] Arrival date & time: 07/06/24  1735      History   Chief Complaint Chief Complaint  Patient presents with   Back Pain    HPI Jason French is a 63 y.o. male.   Patient presenting today with several day history of sharp stabbing bilateral lower back pain that has worsened since onset.  Denies injury to the area, weakness, numbness, tingling, bowel or bladder incontinence, saddle anesthesia.  So far trying hot water  from the shower with good temporary benefit but states pain comes back as soon as the water  is turned off.    Past Medical History:  Diagnosis Date   Arthritis    Bronchitis 10/27/2020   Cancer (HCC)    adrenal cancer   Diabetes mellitus without complication (HCC)    Diverticulitis 2006   s/p sigmoid colectomy, continues with scattered pancolinic diverticula   Hyperlipidemia    Hypertension    Neuropathy    RLS (restless legs syndrome)    Sleep apnea    use CPAP    Patient Active Problem List   Diagnosis Date Noted   Type 2 diabetes mellitus with hyperglycemia (HCC) 01/08/2023   Mixed simple and mucopurulent chronic bronchitis (HCC) 11/07/2020   Dysphagia 01/20/2020   History of adenomatous polyp of colon 01/20/2020   Constipation 01/20/2020   Rectal bleeding 01/20/2020   Osteoarthritis of right knee 07/14/2018   Primary osteoarthritis of right knee 07/13/2018   OA (osteoarthritis) of knee 07/13/2018   S/P right knee arthroscopy 06/12/17 06/16/2017   Derangement of posterior horn of medial meniscus due to old tear or injury, right knee    Primary osteoarthritis of left knee    Prolonged pt (prothrombin time) 05/20/2017   Abnormal bleeding time 05/05/2017   HEMATOCHEZIA 03/09/2010   DIVERTICULITIS, HX OF 03/09/2010   HYPERLIPIDEMIA 02/28/2010   Essential hypertension 02/28/2010    Past Surgical History:  Procedure Laterality Date   BIOPSY  03/13/2020   Procedure: BIOPSY;  Surgeon: Shaaron Lamar HERO, MD;   [facility]: [facility];  Service: Endoscopy;;   COLON SURGERY  2006   ruptured diverticulosis; sigmoid resection.    COLONOSCOPY  10/26/2004   Dr. Shaaron; internal hemorrhoids, few scattered pancolonic diverticula, 3 left colon polyps.  Cannot locate path in chart.   COLONOSCOPY  03/19/2010   Friable anal canal, diminutive polyps about the sigmoid and anastomosis at 30 cm ablated and/or snare removed, multiple polyps from ileocecal valve biopsied/ ablated/or snared.  Residual scattered pancolonic diverticula.  Suspected rectal bleeding from anorectum in the setting of IBS.  Ileocecal pathology with tubular adenomas, otherwise hyperplastic polyps/benign small bowel mucosa. 7 yr repeat   COLONOSCOPY WITH PROPOFOL  N/A 03/13/2020   Procedure: COLONOSCOPY WITH PROPOFOL ;  Surgeon: Shaaron Lamar HERO, MD;  Location: [facility];  Service: Endoscopy;  Laterality: N/A;  7:30am   ESOPHAGOGASTRODUODENOSCOPY (EGD) WITH PROPOFOL  N/A 03/13/2020   Procedure: ESOPHAGOGASTRODUODENOSCOPY (EGD) WITH PROPOFOL ;  Surgeon: Shaaron Lamar HERO, MD;  Location: [facility];  Service: Endoscopy;  Laterality: N/A;   KNEE ARTHROSCOPY WITH MEDIAL MENISECTOMY Right 06/12/2017   Procedure: KNEE ARTHROSCOPY WITH PARTIAL MEDIAL MENISECTOMY;  Surgeon: Margrette Taft BRAVO, MD;  Location: [facility];  Service: Orthopedics;  Laterality: Right;   MALONEY DILATION N/A 03/13/2020   Procedure: AGAPITO DILATION;  Surgeon: Shaaron Lamar HERO, MD;  Location: [facility];  Service: Endoscopy;  Laterality: N/A;   POLYPECTOMY  03/13/2020   Procedure: POLYPECTOMY;  Surgeon: Shaaron,  Lamar HERO, MD;  Location: [facility];  Service: Endoscopy;;   RESECTION OF ABDOMINAL MASS     adrenal mass right (cancer)   TOTAL KNEE ARTHROPLASTY Right 07/13/2018   Procedure: RIGHT TOTAL KNEE ARTHROPLASTY,INJECTION OF LEFT KNEE;  Surgeon: Melodi Lerner, MD;  Location: [facility];  Service: Orthopedics;  Laterality: Right;        Home Medications    Prior to  Admission medications   Medication Sig Start Date End Date Taking? Authorizing Provider  ibuprofen  (ADVIL ) 600 MG tablet Take 1 tablet (600 mg total) by mouth 2 (two) times daily as needed. 07/06/24  Yes Stuart Vernell Norris, PA-C  tiZANidine  (ZANAFLEX ) 4 MG tablet Take 1 tablet (4 mg total) by mouth every 8 (eight) hours as needed for muscle spasms. Do not drink alcohol or drive while taking this medication.  May cause drowsiness. 07/06/24  Yes Stuart Vernell Norris, PA-C  albuterol  (PROVENTIL ) (2.5 MG/3ML) 0.083% nebulizer solution Take 3 mLs (2.5 mg total) by nebulization every 6 (six) hours as needed for wheezing or shortness of breath. Use this medication OR rescue inhaler every 6 hours as needed.  Not both. 07/31/21   Duanne Butler DASEN, MD  albuterol  (VENTOLIN  HFA) 108 [phone] Base) MCG/ACT inhaler Inhale 2 puffs into the lungs every 6 (six) hours as needed for wheezing or shortness of breath. 08/14/23   Duanne Butler DASEN, MD  atorvastatin  (LIPITOR) 10 MG tablet TAKE ONE (1) TABLET BY MOUTH AT BEDTIME. 11/11/22   Duanne Butler DASEN, MD  benazepril  (LOTENSIN ) 40 MG tablet TAKE ONE TABLET BY MOUTH EVERY DAY. 04/14/24   Duanne Butler DASEN, MD  DULoxetine  (CYMBALTA ) 60 MG capsule TAKE ONE CAPSULE BY MOUTH ONCE DAILY. 07/05/24   Duanne Butler DASEN, MD  fluticasone  (FLONASE ) 50 MCG/ACT nasal spray Place 2 sprays into both nostrils daily. 10/25/20   Chandra Harlene LABOR, NP  fluticasone -salmeterol (ADVAIR) 250-50 MCG/ACT AEPB Inhale 1 puff into the lungs in the morning and at bedtime. 08/14/23   Duanne Butler DASEN, MD  gabapentin  (NEURONTIN ) 300 MG capsule TAKE 2 CAPSULES BY MOUTH THREE TIMES A DAY. 01/29/24   Duanne Butler DASEN, MD  hydrochlorothiazide  (HYDRODIURIL ) 25 MG tablet TAKE ONE (1) TABLET BY MOUTH ONCE DAILY. Patient not taking: Reported on 08/14/2023 11/11/22   Duanne Butler DASEN, MD  HYDROcodone -acetaminophen  (NORCO) 5-325 MG tablet Take 1 tablet by mouth every 6 (six) hours as needed for moderate pain. 12/25/21    Duanne Butler DASEN, MD  metFORMIN  (GLUCOPHAGE ) 1000 MG tablet TAKE (1) TABLET BY MOUTH TWICE A DAY WITH A MEAL. 03/26/24   Duanne Butler DASEN, MD  pantoprazole (PROTONIX) 40 MG tablet TAKE (1) TABLET BY MOUTH ONCE DAILY. 03/27/21   Shirlean Therisa ORN, NP  rOPINIRole  (REQUIP ) 1 MG tablet TAKE (2) TABLETS BY MOUTH AT BEDTIME. 04/14/24   Duanne Butler DASEN, MD  tamsulosin  (FLOMAX ) 0.4 MG CAPS capsule Take 1 capsule (0.4 mg total) by mouth daily. 12/11/23   Duanne Butler DASEN, MD    Family History Family History  Problem Relation Age of Onset   Diverticulitis Mother    Hypertension Mother    Diverticulitis Father    Hypertension Father    Colon cancer Father        late 29s    Social History Social History   Tobacco Use   Smoking status: Some Days    Current packs/day: 0.50    Average packs/day: 0.5 packs/day for 40.0 years (20.0 ttl pk-yrs)    Types: Cigarettes  Smokeless tobacco: Never  Vaping Use   Vaping status: Never Used  Substance Use Topics   Alcohol use: Yes    Comment: once a month   Drug use: No     Allergies   Patient has no known allergies.   Review of Systems Review of Systems Per HPI  Physical Exam Triage Vital Signs ED Triage Vitals  Encounter Vitals Group     BP 07/06/24 1750 (!) 158/94     Girls Systolic BP Percentile --      Girls Diastolic BP Percentile --      Boys Systolic BP Percentile --      Boys Diastolic BP Percentile --      Pulse Rate 07/06/24 1750 (!) 110     Resp 07/06/24 1750 16     Temp 07/06/24 1750 98 F (36.7 C)     Temp Source 07/06/24 1750 Oral     SpO2 07/06/24 1750 93 %     Weight --      Height --      Head Circumference --      Peak Flow --      Pain Score 07/06/24 1808 5     Pain Loc --      Pain Education --      Exclude from Growth Chart --    No data found.  Updated Vital Signs BP (!) 159/97 (BP Location: Right Arm)   Pulse 94   Temp 98.2 F (36.8 C) (Oral)   Resp 16   SpO2 95%   Visual Acuity Right Eye  Distance:   Left Eye Distance:   Bilateral Distance:    Right Eye Near:   Left Eye Near:    Bilateral Near:     Physical Exam Vitals and nursing note reviewed.  Constitutional:      Appearance: Normal appearance.  HENT:     Head: Atraumatic.  Eyes:     Extraocular Movements: Extraocular movements intact.     Conjunctiva/sclera: Conjunctivae normal.  Cardiovascular:     Rate and Rhythm: Normal rate.  Pulmonary:     Effort: Pulmonary effort is normal.  Abdominal:     General: Bowel sounds are normal. There is no distension.     Palpations: Abdomen is soft.     Tenderness: There is no abdominal tenderness. There is no guarding.  Musculoskeletal:        General: Tenderness present. No swelling or signs of injury. Normal range of motion.     Cervical back: Normal range of motion and neck supple.     Comments: No midline spinal tenderness to palpation diffusely.  Bilateral lumbar musculature tender to palpation and mild spasm.  Negative straight leg raise bilateral lower extremities.  Normal gait and range of motion  Skin:    General: Skin is warm and dry.  Neurological:     Mental Status: He is oriented to person, place, and time.     Motor: No weakness.     Gait: Gait normal.     Comments: Bilateral lower extremities neurovascularly intact  Psychiatric:        Mood and Affect: Mood normal.        Thought Content: Thought content normal.        Judgment: Judgment normal.      UC Treatments / Results  Labs (all labs ordered are listed, but only abnormal results are displayed) Labs Reviewed  POCT URINE DIPSTICK - Abnormal; Notable for the following components:  Result Value   POC PROTEIN,UA =100 (*)    All other components within normal limits    EKG   Radiology No results found.  Procedures Procedures (including critical care time)  Medications Ordered in UC Medications - No data to display  Initial Impression / Assessment and Plan / UC Course  I have  reviewed the triage vital signs and the nursing notes.  Pertinent labs & imaging results that were available during my care of the patient were reviewed by me and considered in my medical decision making (see chart for details).     Minimally hypertensive in triage otherwise vital signs reassuring.  Urinalysis without evidence of urinary tract infection or kidney stone.  Exam very reassuring with no red flag findings today.  Suspect lumbar strain.  Treat with Zanaflex , low-dose ibuprofen  sparingly, Tylenol , heat, massage, stretches, rest.  Return for worsening or unresolving symptoms.  Final Clinical Impressions(s) / UC Diagnoses   Final diagnoses:  Strain of lumbar region, initial encounter   Discharge Instructions   None    ED Prescriptions     Medication Sig Dispense Auth. Provider   tiZANidine  (ZANAFLEX ) 4 MG tablet Take 1 tablet (4 mg total) by mouth every 8 (eight) hours as needed for muscle spasms. Do not drink alcohol or drive while taking this medication.  May cause drowsiness. 15 tablet Stuart Vernell Norris, PA-C   ibuprofen  (ADVIL ) 600 MG tablet Take 1 tablet (600 mg total) by mouth 2 (two) times daily as needed. 10 tablet Stuart Vernell Norris, [place]      PDMP not reviewed this encounter.   Stuart Vernell Norris, [place] 07/06/24 1941

## 2024-07-09 ENCOUNTER — Other Ambulatory Visit: Payer: Self-pay | Admitting: Family Medicine

## 2024-07-09 DIAGNOSIS — I10 Essential (primary) hypertension: Secondary | ICD-10-CM

## 2024-07-27 ENCOUNTER — Other Ambulatory Visit: Payer: Self-pay | Admitting: Family Medicine

## 2024-08-05 ENCOUNTER — Other Ambulatory Visit: Payer: Self-pay

## 2024-08-05 ENCOUNTER — Telehealth: Payer: Self-pay | Admitting: Family Medicine

## 2024-08-05 DIAGNOSIS — G629 Polyneuropathy, unspecified: Secondary | ICD-10-CM

## 2024-08-05 MED ORDER — GABAPENTIN 300 MG PO CAPS: ORAL_CAPSULE | ORAL | 1 refills | Status: DC

## 2024-08-05 NOTE — Telephone Encounter (Signed)
 Prescription Request  08/05/2024  LOV: 08/14/2023  What is the name of the medication or equipment?   gabapentin  (NEURONTIN ) 300 MG capsule   Have you contacted your pharmacy to request a refill? Yes   Which pharmacy would you like this sent to?  [facility] [place], [place] - [id number] [place] [address] Phone: [phone] Fax: [phone]    Patient notified that their request is being sent to the clinical staff for review and that they should receive a response within 2 business days.   Please advise pharmacist.

## 2024-08-05 NOTE — Telephone Encounter (Signed)
 Sent in medication

## 2024-08-16 ENCOUNTER — Ambulatory Visit: Payer: No Typology Code available for payment source | Admitting: Family Medicine

## 2024-08-16 ENCOUNTER — Encounter: Payer: Self-pay | Admitting: Family Medicine

## 2024-08-16 VITALS — BP 152/108 | HR 88 | Temp 98.0°F | Ht 69.0 in | Wt 253.0 lb

## 2024-08-16 DIAGNOSIS — Z125 Encounter for screening for malignant neoplasm of prostate: Secondary | ICD-10-CM

## 2024-08-16 DIAGNOSIS — J449 Chronic obstructive pulmonary disease, unspecified: Secondary | ICD-10-CM | POA: Diagnosis not present

## 2024-08-16 DIAGNOSIS — E1165 Type 2 diabetes mellitus with hyperglycemia: Secondary | ICD-10-CM | POA: Diagnosis not present

## 2024-08-16 DIAGNOSIS — F17200 Nicotine dependence, unspecified, uncomplicated: Secondary | ICD-10-CM

## 2024-08-16 DIAGNOSIS — I10 Essential (primary) hypertension: Secondary | ICD-10-CM

## 2024-08-16 DIAGNOSIS — Z1211 Encounter for screening for malignant neoplasm of colon: Secondary | ICD-10-CM

## 2024-08-16 DIAGNOSIS — Z0001 Encounter for general adult medical examination with abnormal findings: Secondary | ICD-10-CM | POA: Diagnosis not present

## 2024-08-16 DIAGNOSIS — Z7984 Long term (current) use of oral hypoglycemic drugs: Secondary | ICD-10-CM

## 2024-08-16 DIAGNOSIS — Z23 Encounter for immunization: Secondary | ICD-10-CM | POA: Diagnosis not present

## 2024-08-16 DIAGNOSIS — E7800 Pure hypercholesterolemia, unspecified: Secondary | ICD-10-CM

## 2024-08-16 DIAGNOSIS — Z0000 Encounter for general adult medical examination without abnormal findings: Secondary | ICD-10-CM

## 2024-08-16 DIAGNOSIS — G629 Polyneuropathy, unspecified: Secondary | ICD-10-CM | POA: Diagnosis not present

## 2024-08-16 MED ORDER — GABAPENTIN 300 MG PO CAPS: 600.0000 mg | ORAL_CAPSULE | Freq: Four times a day (QID) | ORAL | 1 refills | Status: AC

## 2024-08-16 MED ORDER — NEBIVOLOL HCL 5 MG PO TABS: 5.0000 mg | ORAL_TABLET | Freq: Every day | ORAL | 11 refills | Status: AC

## 2024-08-16 MED ORDER — BENAZEPRIL HCL 40 MG PO TABS: 40.0000 mg | ORAL_TABLET | Freq: Every day | ORAL | 3 refills | Status: AC

## 2024-08-16 NOTE — Progress Notes (Signed)
 "  Subjective:    Patient ID: Jason French, male    DOB: [birth year], 64 y.o.   MRN: [id number] Patient is a 64 year old Caucasian gentleman who presents today for physical.  He has known COPD.  He is a smoker.  He continues to smoke.  We discussed smoking cessation today.  He is willing to allow me to schedule him for a CT scan of his lungs to screen for lung cancer.  He is also overdue for a colonoscopy.  We scheduled this as well.  The patient is due for a flu shot as well as a shingles vaccine.  He declines the flu shot but he consents to the shingles vaccine.  Unfortunately the neuropathy in his feet continues to get worse.  He now states that he is having trouble with his balance.  This seems to be due more to numbness in his feet contributing to his disequilibrium.  He is also having worsening nerve pain in both feet that keeps him awake at night.  He is already taking gabapentin  600 mg 3 times a day and Cymbalta  60 mg a day and this is not managing the pain. Past Medical History:  Diagnosis Date   Arthritis    Bronchitis 10/27/2020   [facility])    adrenal cancer   Diabetes mellitus without complication (HCC)    Diverticulitis 2006   s/p sigmoid colectomy, continues with scattered pancolinic diverticula   Hyperlipidemia    Hypertension    Neuropathy    RLS (restless legs syndrome)    Sleep apnea    use CPAP   Past Surgical History:  Procedure Laterality Date   BIOPSY  03/13/2020   Procedure: BIOPSY;  Surgeon: Shaaron Lamar HERO, MD;  Location: [facility];  Service: Endoscopy;;   COLON SURGERY  2006   ruptured diverticulosis; sigmoid resection.    COLONOSCOPY  10/26/2004   Dr. Shaaron; internal hemorrhoids, few scattered pancolonic diverticula, 3 left colon polyps.  Cannot locate path in chart.   COLONOSCOPY  03/19/2010   Friable anal canal, diminutive polyps about the sigmoid and anastomosis at 30 cm ablated and/or snare removed, multiple polyps from ileocecal valve biopsied/  ablated/or snared.  Residual scattered pancolonic diverticula.  Suspected rectal bleeding from anorectum in the setting of IBS.  Ileocecal pathology with tubular adenomas, otherwise hyperplastic polyps/benign small bowel mucosa. 7 yr repeat   COLONOSCOPY WITH PROPOFOL  N/A 03/13/2020   Procedure: COLONOSCOPY WITH PROPOFOL ;  Surgeon: Shaaron Lamar HERO, MD;  Location: [facility];  Service: Endoscopy;  Laterality: N/A;  7:30am   ESOPHAGOGASTRODUODENOSCOPY (EGD) WITH PROPOFOL  N/A 03/13/2020   Procedure: ESOPHAGOGASTRODUODENOSCOPY (EGD) WITH PROPOFOL ;  Surgeon: Shaaron Lamar HERO, MD;  Location: [facility];  Service: Endoscopy;  Laterality: N/A;   KNEE ARTHROSCOPY WITH MEDIAL MENISECTOMY Right 06/12/2017   Procedure: KNEE ARTHROSCOPY WITH PARTIAL MEDIAL MENISECTOMY;  Surgeon: Margrette Taft BRAVO, MD;  Location: [facility];  Service: Orthopedics;  Laterality: Right;   MALONEY DILATION N/A 03/13/2020   Procedure: AGAPITO DILATION;  Surgeon: Shaaron Lamar HERO, MD;  Location: [facility];  Service: Endoscopy;  Laterality: N/A;   POLYPECTOMY  03/13/2020   Procedure: POLYPECTOMY;  Surgeon: Shaaron Lamar HERO, MD;  Location: [facility];  Service: Endoscopy;;   RESECTION OF ABDOMINAL MASS     adrenal mass right (cancer)   TOTAL KNEE ARTHROPLASTY Right 07/13/2018   Procedure: RIGHT TOTAL KNEE ARTHROPLASTY,INJECTION OF LEFT KNEE;  Surgeon: Melodi Lerner, MD;  Location: [facility];  Service: Orthopedics;  Laterality: Right;    Current Outpatient Medications on File Prior to Visit  Medication Sig Dispense Refill   albuterol  (PROVENTIL ) (2.5 MG/3ML) 0.083% nebulizer solution Take 3 mLs (2.5 mg total) by nebulization every 6 (six) hours as needed for wheezing or shortness of breath. Use this medication OR rescue inhaler every 6 hours as needed.  Not both. 150 mL 3   albuterol  (VENTOLIN  HFA) 108 (90 Base) MCG/ACT inhaler Inhale 2 puffs into the lungs every 6 (six) hours as needed for wheezing or shortness of breath. 1  each 3   atorvastatin  (LIPITOR) 10 MG tablet TAKE ONE (1) TABLET BY MOUTH AT BEDTIME. 90 tablet 1   benazepril  (LOTENSIN ) 40 MG tablet TAKE ONE TABLET BY MOUTH EVERY DAY. 30 tablet 2   DULoxetine  (CYMBALTA ) 60 MG capsule TAKE ONE CAPSULE BY MOUTH ONCE DAILY. 90 capsule 1   fluticasone  (FLONASE ) 50 MCG/ACT nasal spray Place 2 sprays into both nostrils daily. 16 g 6   fluticasone -salmeterol (ADVAIR) 250-50 MCG/ACT AEPB Inhale 1 puff into the lungs in the morning and at bedtime. 60 each 11   gabapentin  (NEURONTIN ) 300 MG capsule TAKE 2 CAPSULES BY MOUTH THREE TIMES A DAY. 540 capsule 1   hydrochlorothiazide  (HYDRODIURIL ) 25 MG tablet TAKE ONE (1) TABLET BY MOUTH ONCE DAILY. (Patient not taking: Reported on 08/14/2023) 90 tablet 1   HYDROcodone -acetaminophen  (NORCO) 5-325 MG tablet Take 1 tablet by mouth every 6 (six) hours as needed for moderate pain. 30 tablet 0   ibuprofen  (ADVIL ) 600 MG tablet Take 1 tablet (600 mg total) by mouth 2 (two) times daily as needed. 10 tablet 0   metFORMIN  (GLUCOPHAGE ) 1000 MG tablet TAKE (1) TABLET BY MOUTH TWICE A DAY WITH A MEAL. 180 tablet 1   pantoprazole (PROTONIX) 40 MG tablet TAKE (1) TABLET BY MOUTH ONCE DAILY. 90 tablet 3   rOPINIRole  (REQUIP ) 1 MG tablet TAKE (2) TABLETS BY MOUTH AT BEDTIME. 180 tablet 2   tamsulosin  (FLOMAX ) 0.4 MG CAPS capsule TAKE ONE CAPSULE BY MOUTH EVERY DAY 90 capsule 1   tiZANidine  (ZANAFLEX ) 4 MG tablet Take 1 tablet (4 mg total) by mouth every 8 (eight) hours as needed for muscle spasms. Do not drink alcohol or drive while taking this medication.  May cause drowsiness. 15 tablet 0   No current facility-administered medications on file prior to visit.   No Known Allergies Social History   Socioeconomic History   Marital status: Married    Spouse name: Not on file   Number of children: Not on file   Years of education: Not on file   Highest education level: Not on file  Occupational History   Not on file  Tobacco Use    Smoking status: Some Days    Current packs/day: 0.50    Average packs/day: 0.5 packs/day for 40.0 years (20.0 ttl pk-yrs)    Types: Cigarettes   Smokeless tobacco: Never  Vaping Use   Vaping status: Never Used  Substance and Sexual Activity   Alcohol use: Yes    Comment: once a month   Drug use: No   Sexual activity: Yes  Other Topics Concern   Not on file  Social History Narrative   Not on file   Social Drivers of Health   Tobacco Use: High Risk (08/16/2024)   Patient History    Smoking Tobacco Use: Some Days    Smokeless Tobacco Use: Never    Passive Exposure: Not on file  Financial Resource Strain: Not on  file  Food Insecurity: Not on file  Transportation Needs: Not on file  Physical Activity: Not on file  Stress: Not on file  Social Connections: Unknown (12/09/2021)   Received from [facility]   Social Network    Social Network: Not on file  Intimate Partner Violence: Unknown (10/31/2021)   Received from [facility]   HITS    Physically Hurt: Not on file    Insult or Talk Down To: Not on file    Threaten Physical Harm: Not on file    Scream or Curse: Not on file  Depression (PHQ2-9): Low Risk (08/16/2024)   Depression (PHQ2-9)    PHQ-2 Score: 0  Alcohol Screen: Not on file  Housing: Not on file  Utilities: Not on file  Health Literacy: Not on file      Review of Systems  All other systems reviewed and are negative.      Objective:   Physical Exam Vitals reviewed.  Constitutional:      General: He is not in acute distress.    Appearance: He is well-developed. He is not diaphoretic.  HENT:     Head: Normocephalic and atraumatic.     Right Ear: External ear normal.     Left Ear: External ear normal.     Nose: Nose normal.     Mouth/Throat:     Pharynx: No oropharyngeal exudate.  Eyes:     General: No scleral icterus.       Right eye: No discharge.        Left eye: No discharge.     Conjunctiva/sclera: Conjunctivae normal.     Pupils: Pupils  are equal, round, and reactive to light.  Neck:     Thyroid: No thyromegaly.     Vascular: No JVD.     Trachea: No tracheal deviation.  Cardiovascular:     Rate and Rhythm: Normal rate and regular rhythm.     Heart sounds: Normal heart sounds. No murmur heard.    No friction rub. No gallop.  Pulmonary:     Effort: Pulmonary effort is normal. No respiratory distress.     Breath sounds: No stridor. Examination of the right-upper field reveals decreased breath sounds. Examination of the left-upper field reveals decreased breath sounds. Examination of the right-middle field reveals decreased breath sounds. Examination of the left-middle field reveals decreased breath sounds. Examination of the right-lower field reveals decreased breath sounds. Examination of the left-lower field reveals decreased breath sounds. Decreased breath sounds present. No wheezing, rhonchi or rales.  Chest:     Chest wall: No tenderness.  Abdominal:     General: Bowel sounds are normal. There is no distension.     Palpations: Abdomen is soft. There is no mass.     Tenderness: There is no abdominal tenderness. There is no guarding or rebound.  Musculoskeletal:        General: No tenderness or deformity. Normal range of motion.     Cervical back: Normal range of motion and neck supple.  Lymphadenopathy:     Cervical: No cervical adenopathy.  Skin:    General: Skin is warm.     Coloration: Skin is not pale.     Findings: No erythema or rash.  Neurological:     Mental Status: He is alert and oriented to person, place, and time.     Cranial Nerves: No cranial nerve deficit.     Motor: No abnormal muscle tone.     Coordination: Coordination normal.  Deep Tendon Reflexes: Reflexes are normal and symmetric.  Psychiatric:        Behavior: Behavior normal.        Thought Content: Thought content normal.        Judgment: Judgment normal.           Assessment & Plan:  General medical exam  Chronic  obstructive pulmonary disease, unspecified COPD type (HCC)  Prostate cancer screening - Plan: PSA  Pure hypercholesterolemia - Plan: Hemoglobin A1c, CBC with Differential/Platelet, Comprehensive metabolic panel with GFR, Lipid panel, Microalbumin / creatinine urine ratio, PSA  Type 2 diabetes mellitus with hyperglycemia, without long-term current use of insulin  (HCC) - Plan: Hemoglobin A1c, CBC with Differential/Platelet, Comprehensive metabolic panel with GFR, Lipid panel, Microalbumin / creatinine urine ratio, PSA  Essential hypertension - Plan: Hemoglobin A1c, CBC with Differential/Platelet, Comprehensive metabolic panel with GFR, Lipid panel, Microalbumin / creatinine urine ratio, PSA, benazepril  (LOTENSIN ) 40 MG tablet  Colon cancer screening - Plan: Ambulatory referral to Gastroenterology  Smoking addiction - Plan: CT CHEST LUNG CA SCREEN LOW DOSE W/O CM  Peripheral polyneuropathy - Plan: gabapentin  (NEURONTIN ) 300 MG capsule Patient's blood pressure is elevated.  He does not want to take hydrochlorothiazide  because it made him feel bad.  He does not want to use amlodipine due to potential swelling in his feet as he is already having pain in his feet.  Therefore his options include Bystolic  versus doxazosin.  Patient elects Bystolic  5 mg a day.  He will call me with blood pressures in 3 weeks.  If the Bystolic  is too expensive we could try doxazosin but we would need to discontinue Flomax  to do that.  I will check a CBC a CMP a lipid panel and A1c and a urine microalbumin to creatinine ratio.  I like to see his A1c less than 6.5, his LDL cholesterol less than 70 to prevent further plaque formation in his heart and his microalbumin to creatinine ratio to be less than 30.  I will screen for prostate cancer with a PSA.  I placed a referral for colonoscopy.  I also placed an order for a CT scan of his lungs to screen for lung cancer.  I continue to encourage smoking cessation "

## 2024-08-16 NOTE — Addendum Note (Signed)
 Addended by: ANGELENA RONAL BRADLEY K on: 08/16/2024 09:24 AM   Modules accepted: Orders

## 2024-08-17 ENCOUNTER — Other Ambulatory Visit: Payer: Self-pay

## 2024-08-17 ENCOUNTER — Ambulatory Visit: Payer: Self-pay | Admitting: Family Medicine

## 2024-08-17 DIAGNOSIS — Z0000 Encounter for general adult medical examination without abnormal findings: Secondary | ICD-10-CM

## 2024-08-17 DIAGNOSIS — E7800 Pure hypercholesterolemia, unspecified: Secondary | ICD-10-CM

## 2024-08-17 DIAGNOSIS — E1165 Type 2 diabetes mellitus with hyperglycemia: Secondary | ICD-10-CM

## 2024-08-17 LAB — COMPREHENSIVE METABOLIC PANEL WITH GFR
AG Ratio: 1.5 (calc) (ref 1.0–2.5)
ALT: 9 U/L (ref 9–46)
AST: 10 U/L (ref 10–35)
Albumin: 4 g/dL (ref 3.6–5.1)
Alkaline phosphatase (APISO): 86 U/L (ref 35–144)
BUN: 8 mg/dL (ref 7–25)
CO2: 28 mmol/L (ref 20–32)
Calcium: 9.2 mg/dL (ref 8.6–10.3)
Chloride: 101 mmol/L (ref 98–110)
Creat: 1.1 mg/dL (ref 0.70–1.35)
Globulin: 2.7 g/dL (ref 1.9–3.7)
Glucose, Bld: 107 mg/dL — ABNORMAL HIGH (ref 65–99)
Potassium: 4.6 mmol/L (ref 3.5–5.3)
Sodium: 138 mmol/L (ref 135–146)
Total Bilirubin: 0.5 mg/dL (ref 0.2–1.2)
Total Protein: 6.7 g/dL (ref 6.1–8.1)
eGFR: 75 mL/min/1.73m2

## 2024-08-17 LAB — MICROALBUMIN / CREATININE URINE RATIO
Creatinine, Urine: 98 mg/dL (ref 20–320)
Microalb Creat Ratio: 92 mg/g{creat} — ABNORMAL HIGH
Microalb, Ur: 9 mg/dL

## 2024-08-17 LAB — CBC WITH DIFFERENTIAL/PLATELET
Absolute Lymphocytes: 3562 {cells}/uL (ref 850–3900)
Absolute Monocytes: 1147 {cells}/uL — ABNORMAL HIGH (ref 200–950)
Basophils Absolute: 98 {cells}/uL (ref 0–200)
Basophils Relative: 0.8 %
Eosinophils Absolute: 1818 {cells}/uL — ABNORMAL HIGH (ref 15–500)
Eosinophils Relative: 14.9 %
HCT: 46.3 % (ref 39.4–51.1)
Hemoglobin: 15.4 g/dL (ref 13.2–17.1)
MCH: 29.6 pg (ref 27.0–33.0)
MCHC: 33.3 g/dL (ref 31.6–35.4)
MCV: 89 fL (ref 81.4–101.7)
MPV: 9.5 fL (ref 7.5–12.5)
Monocytes Relative: 9.4 %
Neutro Abs: 5575 {cells}/uL (ref 1500–7800)
Neutrophils Relative %: 45.7 %
Platelets: 332 Thousand/uL (ref 140–400)
RBC: 5.2 Million/uL (ref 4.20–5.80)
RDW: 13.4 % (ref 11.0–15.0)
Total Lymphocyte: 29.2 %
WBC: 12.2 Thousand/uL — ABNORMAL HIGH (ref 3.8–10.8)

## 2024-08-17 LAB — PSA: PSA: 1.14 ng/mL

## 2024-08-17 LAB — LIPID PANEL
Cholesterol: 162 mg/dL
HDL: 38 mg/dL — ABNORMAL LOW
LDL Cholesterol (Calc): 99 mg/dL
Non-HDL Cholesterol (Calc): 124 mg/dL
Total CHOL/HDL Ratio: 4.3 (calc)
Triglycerides: 155 mg/dL — ABNORMAL HIGH

## 2024-08-17 LAB — HEMOGLOBIN A1C
Hgb A1c MFr Bld: 6.2 % — ABNORMAL HIGH
Mean Plasma Glucose: 131 mg/dL
eAG (mmol/L): 7.3 mmol/L

## 2024-08-17 MED ORDER — ATORVASTATIN CALCIUM 40 MG PO TABS: 40.0000 mg | ORAL_TABLET | Freq: Every day | ORAL | 3 refills | Status: AC

## 2024-08-26 ENCOUNTER — Encounter: Payer: Self-pay | Admitting: Emergency Medicine
